# Patient Record
Sex: Female | Born: 1945
Health system: Southern US, Community
[De-identification: ages and names within clinical notes are randomized; demographics above are authoritative.]

## PROBLEM LIST (undated history)

## (undated) ENCOUNTER — Emergency Department (HOSPITAL_COMMUNITY): Admission: EM | Payer: Medicare PPO | Source: Home / Self Care

## (undated) DIAGNOSIS — D649 Anemia, unspecified: Secondary | ICD-10-CM

## (undated) DIAGNOSIS — H9190 Unspecified hearing loss, unspecified ear: Secondary | ICD-10-CM

## (undated) DIAGNOSIS — C4490 Unspecified malignant neoplasm of skin, unspecified: Secondary | ICD-10-CM

## (undated) DIAGNOSIS — M549 Dorsalgia, unspecified: Secondary | ICD-10-CM

## (undated) DIAGNOSIS — E785 Hyperlipidemia, unspecified: Secondary | ICD-10-CM

## (undated) DIAGNOSIS — I83819 Varicose veins of unspecified lower extremities with pain: Secondary | ICD-10-CM

## (undated) DIAGNOSIS — J189 Pneumonia, unspecified organism: Secondary | ICD-10-CM

## (undated) DIAGNOSIS — Z9889 Other specified postprocedural states: Secondary | ICD-10-CM

## (undated) DIAGNOSIS — C801 Malignant (primary) neoplasm, unspecified: Secondary | ICD-10-CM

## (undated) DIAGNOSIS — K219 Gastro-esophageal reflux disease without esophagitis: Secondary | ICD-10-CM

## (undated) DIAGNOSIS — M79606 Pain in leg, unspecified: Secondary | ICD-10-CM

## (undated) DIAGNOSIS — M8580 Other specified disorders of bone density and structure, unspecified site: Secondary | ICD-10-CM

## (undated) DIAGNOSIS — R112 Nausea with vomiting, unspecified: Secondary | ICD-10-CM

## (undated) DIAGNOSIS — R519 Headache, unspecified: Secondary | ICD-10-CM

## (undated) DIAGNOSIS — R51 Headache: Secondary | ICD-10-CM

## (undated) DIAGNOSIS — R011 Cardiac murmur, unspecified: Secondary | ICD-10-CM

## (undated) DIAGNOSIS — I10 Essential (primary) hypertension: Secondary | ICD-10-CM

## (undated) DIAGNOSIS — M2550 Pain in unspecified joint: Secondary | ICD-10-CM

## (undated) DIAGNOSIS — M1990 Unspecified osteoarthritis, unspecified site: Secondary | ICD-10-CM

## (undated) HISTORY — DX: Unspecified osteoarthritis, unspecified site: M19.90

## (undated) HISTORY — DX: Varicose veins of unspecified lower extremity with pain: I83.819

## (undated) HISTORY — DX: Dorsalgia, unspecified: M54.9

## (undated) HISTORY — DX: Unspecified hearing loss, unspecified ear: H91.90

## (undated) HISTORY — DX: Other specified disorders of bone density and structure, unspecified site: M85.80

## (undated) HISTORY — PX: CATARACT EXTRACTION: SUR2

## (undated) HISTORY — DX: Headache: R51

## (undated) HISTORY — DX: Hyperlipidemia, unspecified: E78.5

## (undated) HISTORY — DX: Headache, unspecified: R51.9

## (undated) HISTORY — DX: Essential (primary) hypertension: I10

## (undated) HISTORY — PX: ECTOPIC PREGNANCY SURGERY: SHX613

## (undated) HISTORY — PX: FINGER SURGERY: SHX640

## (undated) HISTORY — DX: Pain in leg, unspecified: M79.606

## (undated) HISTORY — PX: EXPLORATORY TYMPANOTOMY: SHX1545

## (undated) HISTORY — PX: STAPEDECTOMY: SHX2435

## (undated) HISTORY — DX: Cardiac murmur, unspecified: R01.1

## (undated) HISTORY — DX: Pain in unspecified joint: M25.50

## (undated) HISTORY — PX: ENDOVENOUS ABLATION SAPHENOUS VEIN W/ LASER: SUR449

## (undated) HISTORY — PX: OTHER SURGICAL HISTORY: SHX169

---
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
## 1980-04-22 HISTORY — PX: ABDOMINAL HYSTERECTOMY: SHX81

## 1997-12-08 ENCOUNTER — Other Ambulatory Visit: Admission: RE | Admit: 1997-12-08 | Discharge: 1997-12-08 | Payer: Self-pay | Admitting: Obstetrics and Gynecology

## 1998-02-16 ENCOUNTER — Ambulatory Visit (HOSPITAL_COMMUNITY): Admission: RE | Admit: 1998-02-16 | Discharge: 1998-02-16 | Payer: Self-pay | Admitting: Gynecology

## 1999-02-14 ENCOUNTER — Ambulatory Visit (HOSPITAL_COMMUNITY): Admission: RE | Admit: 1999-02-14 | Discharge: 1999-02-14 | Payer: Self-pay | Admitting: Gynecology

## 1999-02-14 ENCOUNTER — Encounter: Payer: Self-pay | Admitting: Gynecology

## 1999-04-23 HISTORY — PX: STAPEDECTOMY: SHX2435

## 1999-07-31 ENCOUNTER — Ambulatory Visit (HOSPITAL_BASED_OUTPATIENT_CLINIC_OR_DEPARTMENT_OTHER): Admission: RE | Admit: 1999-07-31 | Discharge: 1999-08-01 | Payer: Self-pay | Admitting: Otolaryngology

## 1999-08-16 ENCOUNTER — Other Ambulatory Visit: Admission: RE | Admit: 1999-08-16 | Discharge: 1999-08-16 | Payer: Self-pay | Admitting: Gynecology

## 2000-01-03 ENCOUNTER — Ambulatory Visit (HOSPITAL_BASED_OUTPATIENT_CLINIC_OR_DEPARTMENT_OTHER): Admission: RE | Admit: 2000-01-03 | Discharge: 2000-01-03 | Payer: Self-pay | Admitting: Otolaryngology

## 2000-02-20 ENCOUNTER — Encounter: Payer: Self-pay | Admitting: Gynecology

## 2000-02-20 ENCOUNTER — Ambulatory Visit (HOSPITAL_COMMUNITY): Admission: RE | Admit: 2000-02-20 | Discharge: 2000-02-20 | Payer: Self-pay | Admitting: Gynecology

## 2000-08-20 ENCOUNTER — Other Ambulatory Visit: Admission: RE | Admit: 2000-08-20 | Discharge: 2000-08-20 | Payer: Self-pay | Admitting: Obstetrics and Gynecology

## 2000-12-18 ENCOUNTER — Encounter (INDEPENDENT_AMBULATORY_CARE_PROVIDER_SITE_OTHER): Payer: Self-pay

## 2000-12-18 ENCOUNTER — Other Ambulatory Visit: Admission: RE | Admit: 2000-12-18 | Discharge: 2000-12-18 | Payer: Self-pay | Admitting: Otolaryngology

## 2001-10-01 ENCOUNTER — Other Ambulatory Visit: Admission: RE | Admit: 2001-10-01 | Discharge: 2001-10-01 | Payer: Self-pay | Admitting: Gynecology

## 2002-12-22 ENCOUNTER — Other Ambulatory Visit: Admission: RE | Admit: 2002-12-22 | Discharge: 2002-12-22 | Payer: Self-pay | Admitting: Gynecology

## 2003-02-23 ENCOUNTER — Ambulatory Visit (HOSPITAL_COMMUNITY): Admission: RE | Admit: 2003-02-23 | Discharge: 2003-02-23 | Payer: Self-pay | Admitting: Gynecology

## 2003-03-24 ENCOUNTER — Ambulatory Visit (HOSPITAL_COMMUNITY): Admission: RE | Admit: 2003-03-24 | Discharge: 2003-03-24 | Payer: Self-pay | Admitting: Gastroenterology

## 2003-03-24 ENCOUNTER — Encounter (INDEPENDENT_AMBULATORY_CARE_PROVIDER_SITE_OTHER): Payer: Self-pay | Admitting: Specialist

## 2003-07-28 ENCOUNTER — Encounter: Admission: RE | Admit: 2003-07-28 | Discharge: 2003-07-28 | Payer: Self-pay | Admitting: Gynecology

## 2003-11-03 ENCOUNTER — Encounter: Admission: RE | Admit: 2003-11-03 | Discharge: 2003-11-03 | Payer: Self-pay | Admitting: Diagnostic Radiology

## 2003-11-10 ENCOUNTER — Encounter: Admission: RE | Admit: 2003-11-10 | Discharge: 2003-11-10 | Payer: Self-pay | Admitting: Diagnostic Radiology

## 2003-12-01 ENCOUNTER — Encounter: Admission: RE | Admit: 2003-12-01 | Discharge: 2003-12-01 | Payer: Self-pay | Admitting: Diagnostic Radiology

## 2004-01-12 ENCOUNTER — Encounter: Admission: RE | Admit: 2004-01-12 | Discharge: 2004-01-12 | Payer: Self-pay | Admitting: Diagnostic Radiology

## 2004-02-15 ENCOUNTER — Other Ambulatory Visit: Admission: RE | Admit: 2004-02-15 | Discharge: 2004-02-15 | Payer: Self-pay | Admitting: Gynecology

## 2004-05-24 ENCOUNTER — Encounter: Admission: RE | Admit: 2004-05-24 | Discharge: 2004-05-24 | Payer: Self-pay | Admitting: Dermatology

## 2004-06-13 ENCOUNTER — Ambulatory Visit (HOSPITAL_COMMUNITY): Admission: RE | Admit: 2004-06-13 | Discharge: 2004-06-13 | Payer: Self-pay | Admitting: Gynecology

## 2005-02-25 ENCOUNTER — Other Ambulatory Visit: Admission: RE | Admit: 2005-02-25 | Discharge: 2005-02-25 | Payer: Self-pay | Admitting: Gynecology

## 2005-06-24 ENCOUNTER — Ambulatory Visit (HOSPITAL_COMMUNITY): Admission: RE | Admit: 2005-06-24 | Discharge: 2005-06-24 | Payer: Self-pay | Admitting: Gynecology

## 2005-07-11 ENCOUNTER — Encounter: Admission: RE | Admit: 2005-07-11 | Discharge: 2005-07-11 | Payer: Self-pay | Admitting: Gynecology

## 2005-08-12 ENCOUNTER — Encounter: Admission: RE | Admit: 2005-08-12 | Discharge: 2005-08-12 | Payer: Self-pay | Admitting: Internal Medicine

## 2005-11-15 ENCOUNTER — Ambulatory Visit (HOSPITAL_BASED_OUTPATIENT_CLINIC_OR_DEPARTMENT_OTHER): Admission: RE | Admit: 2005-11-15 | Discharge: 2005-11-15 | Payer: Self-pay | Admitting: Orthopedic Surgery

## 2006-04-10 ENCOUNTER — Ambulatory Visit (HOSPITAL_BASED_OUTPATIENT_CLINIC_OR_DEPARTMENT_OTHER): Admission: RE | Admit: 2006-04-10 | Discharge: 2006-04-10 | Payer: Self-pay | Admitting: Orthopedic Surgery

## 2006-05-15 ENCOUNTER — Other Ambulatory Visit: Admission: RE | Admit: 2006-05-15 | Discharge: 2006-05-15 | Payer: Self-pay | Admitting: Gynecology

## 2007-02-10 ENCOUNTER — Ambulatory Visit (HOSPITAL_COMMUNITY): Admission: RE | Admit: 2007-02-10 | Discharge: 2007-02-10 | Payer: Self-pay | Admitting: Gynecology

## 2007-07-22 ENCOUNTER — Other Ambulatory Visit: Admission: RE | Admit: 2007-07-22 | Discharge: 2007-07-22 | Payer: Self-pay | Admitting: Gynecology

## 2008-11-18 ENCOUNTER — Other Ambulatory Visit: Admission: RE | Admit: 2008-11-18 | Discharge: 2008-11-18 | Payer: Self-pay | Admitting: Gynecology

## 2008-11-18 ENCOUNTER — Encounter: Payer: Self-pay | Admitting: Women's Health

## 2008-11-18 ENCOUNTER — Ambulatory Visit: Payer: Self-pay | Admitting: Women's Health

## 2009-02-22 ENCOUNTER — Ambulatory Visit (HOSPITAL_COMMUNITY): Admission: RE | Admit: 2009-02-22 | Discharge: 2009-02-22 | Payer: Self-pay | Admitting: Gynecology

## 2009-03-03 ENCOUNTER — Encounter: Admission: RE | Admit: 2009-03-03 | Discharge: 2009-03-03 | Payer: Self-pay | Admitting: Gynecology

## 2010-03-07 ENCOUNTER — Encounter: Admission: RE | Admit: 2010-03-07 | Discharge: 2010-03-07 | Payer: Self-pay | Admitting: Gynecology

## 2010-03-07 ENCOUNTER — Ambulatory Visit: Payer: Self-pay | Admitting: Women's Health

## 2010-04-17 ENCOUNTER — Ambulatory Visit: Payer: Self-pay | Admitting: Women's Health

## 2010-05-13 ENCOUNTER — Encounter: Payer: Self-pay | Admitting: Dermatology

## 2010-09-05 ENCOUNTER — Encounter (INDEPENDENT_AMBULATORY_CARE_PROVIDER_SITE_OTHER): Payer: Medicare PPO

## 2010-09-05 ENCOUNTER — Encounter (INDEPENDENT_AMBULATORY_CARE_PROVIDER_SITE_OTHER): Payer: Medicare PPO | Admitting: Vascular Surgery

## 2010-09-05 DIAGNOSIS — I83893 Varicose veins of bilateral lower extremities with other complications: Secondary | ICD-10-CM

## 2010-09-05 DIAGNOSIS — M79609 Pain in unspecified limb: Secondary | ICD-10-CM

## 2010-09-05 NOTE — Consult Note (Signed)
NEW PATIENT CONSULTATION  Natalie, [name] DOB:  1946-03-13                                       09/05/2010 ZOXWR#:[id number]  Patient presents today for discussion regarding venous hypertension and discomfort in her legs.  She is a very pleasant, healthy 64-year white female with a long history of progressive venous pathology.  She had been seen in our office as early as 1994.  She has had progressive discomfort and has had prior treatment with sclerotherapy and apparently also had treatment with laser ablation in her left leg as early as a decade ago.  This was in the saphenous vein, according to the patient. Her main complaint is of aching discomfort in both lower extremities, somewhat worse on her left than on her right.  She does not have any marked swelling.  She does have an extensive aching and throbbing, more so on the right than on the left.  This is worse with prolonged standing.  She does have some discomfort related to the varicosities extending over her left anterior thigh and popliteal fossa.  She does not have any history of DVT or bleeding.  She does not have any history of venous ulcers.  She does have a history of restless leg syndrome. She has worn both pantyhose and thigh hose graduated compression garments in the past.  PAST MEDICAL HISTORY:  Otherwise positive for hypertension and elevated cholesterol.  She is married with two children.  She is retired.  She does not smoke or drink alcohol.  REVIEW OF SYSTEMS:  No weight loss or gain.  She weighs 138 pounds.  She is 5 feet 2 inches tall. VASCULAR:  Positive for pain with prolonged standing and walking. CARDIAC:  Heart murmur. GI:  Reflux. NEUROLOGIC:  Headache. ENT:  Change in hearing. MUSCULOSKELETAL:  Positive for joint pain and arthritis, otherwise review of systems are negative.  PHYSICAL EXAMINATION:  A well-developed and well-nourished white female appearing stated age in no  acute distress.  Blood pressure 154/51, pulse 71, respirations 18.  HEENT:  Normal.  Her dorsalis pedis pulses are 2+ bilaterally.  She has 2+ radial pulses bilaterally.  Musculoskeletal shows no major deformity or cyanosis.  Neurologic:  No focal weakness or paresthesias.  Skin without ulcers or rashes.  She did undergo formal venous duplex in our office today.  This did show normal caliber in her saphenous vein on the right with some very mild reflux in the mid thigh.  On the left she also has a normal caliber saphenous vein.  There are several tributary branches, and on imaging myself it does appear that she may have had a partial ablation of some of her great saphenous vein, although this is not enlarged.  She does have a branch that comes off near the saphenofemoral junction that is refluxing and causing all of the varicosities extending down her thigh and popliteal fossa.  I discussed the significance of this with this patient, explaining this is due to venous hypertension in this refluxing tributary branch.  I have recommended that we would get her to restart the use of her thigh- high graduated compression stockings 20-30 mmHg.  She will be diligent with this, and we will see her again in 3 months for further discussion. I do not see any necessity for ablation of her saphenous veins, as this does not appear  to be the culprit.  This appears to be an enlarged tributary coming off the saphenous vein near the saphenofemoral junction.  I felt this would be most appropriately treated with stab phlebectomy should she follow conservative treatment.  We will discuss this further with her in 3 months.    Larina Earthly, M.D. Electronically Signed  TFE/MEDQ  D:  09/05/2010  T:  09/05/2010  Job:  [id number]  cc:   Venancio Poisson, MD

## 2010-09-07 NOTE — Op Note (Signed)
NAME:  Natalie [name], Natalie [name]               ACCOUNT NO.:  [id number]   MEDICAL RECORD NO.:  [id number]          PATIENT TYPE:  AMB   LOCATION:  DSC                          FACILITY:  [facility]   PHYSICIAN:  Katy Fitch. Sypher, M.D. DATE OF BIRTH:  [birth year]   DATE OF PROCEDURE:  11/15/2005  DATE OF DISCHARGE:                                 OPERATIVE REPORT   PREOPERATIVE DIAGNOSIS:  Displaced periarticular fractures of right ring and  small finger proximal phalangeal bases.   POSTOPERATIVE DIAGNOSIS:  Displaced periarticular fractures of right ring  and small finger proximal phalangeal bases.   OPERATION:  Closed reduction, percutaneous Kirschner wire fixation of right  ring and small finger P1 displaced fractures.   OPERATIONS:  Josephine Igo, M.D.   ASSISTANT:  Annye Rusk, P.A.-C.   ANESTHESIA:  General by LMA, supplemented by an axillary block.   SUPERVISING ANESTHESIOLOGIST:  Burna Forts, M.D.   INDICATIONS:  [name] is a 65 year old woman who fell while visiting  [place], [place], on November 11, 2005 sustaining closed displaced proximal  metaphyseal fractures of the right ring and small finger proximal phalangeal  metaphyseal basis.   She had significant displacement.  She was seen in [place] were x-rays were  obtained documenting her injury.  She was placed in a compression dressing  and returned home for definitive care.  She sought a hand surgery consult on  November 14, 2005 and at that time was advised to proceed with closed reduction  and percutaneous pinning to obtain and maintain an anatomic reduction of her  fracture predicament.   Previously, more than 10 years ago, she had sustained a distal radius  fracture that was treated with closed reduction and external fixation.  She  developed a postoperative dystrophy and had chronic finger stiffness.   We described to her the strategy of obtaining accurate reduction followed by  aggressive therapy in an effort to prevent  post-traumatic dystrophy  following this injury.  Preoperatively questions were invited and answered  in detail.   PROCEDURE:  Britzy Graul was brought to the operating room and placed in  supine position on the operating table.   Following an anesthesia consult by Dr. Jacklynn Bue, a combined anesthesia  strategy of an axillary block and general anesthesia by LMA was recommended.   Under Dr. Marlane Mingle supervision, Ms. Betty was placed under general  anesthesia by LMA technique followed by routine Betadine scrub and paint of  her right upper extremity.   With the aid of a C-arm fluoroscope, her fractures were manipulated to an  anatomic position through a combined maneuver of traction, palmar flexion of  the distal fragments, and use of a fluoroscope to assure proper rotation and  angular correction of her deformity.   When the fracture appeared anatomic, the ring and small finger proximal  phalanges were secured with intermaxillary 0.035-inch Kirschner wires driven  across the ring and small finger metacarpal heads into the intermaxillary  canal.   AP and lateral C-arm images were obtained documenting satisfactory position  of the Kirschner wires as well as anatomic reduction of the fractures.  The wounds were then dressed with Xeroflo, sterile gauze and a voluminous  hand dressing with dorsal and palmar plaster splints maintaining the hand in  the safe position.  There were no apparent complications.      Katy Fitch Sypher, M.D.  Electronically Signed     RVS/MEDQ  D:  11/15/2005  T:  11/15/2005  Job:  [id number]   cc:   Ladell Pier, M.D.

## 2010-09-07 NOTE — Op Note (Signed)
[place]. [facility]  Patient:    Natalie [name], Natalie [name]                      MRN: [id number] Proc. Date: 07/31/99 Adm. Date:  [id number] Attending:  Serena Colonel H                           Operative Report  PREOPERATIVE DIAGNOSIS:  Conductive hearing loss, suspected otosclerosis.  POSTOPERATIVE DIAGNOSIS:  Otosclerosis.  OPERATION:  Right stapedectomy.  SURGEON:  Jefry H. Pollyann Kennedy, M.D.  ANESTHESIA:  General endotracheal  COMPLICATIONS:  None.  FINDINGS: 1. Stiffening and partial fixation of the stapes foot plate. 2. Normal mobility of the malleus and incus. 3. Thickening and fibrotic middle ear mucosa of hypotympanum. 4. 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  PROSTHESIS USED:  A Lippe modified Robinson bucket handle prosthesis 2.5 mm length.  DISPOSITION:  The patient tolerated the procedure well, was awakened, extubated and transferred to recovery in stable condition.  INDICATION FOR PROCEDURE:  A 65 year old with progressive hearing loss in the right ear and some mild hearing loss in the left ear as well.  The risks, benefits, alternatives and complications of the procedure were explained to the patient and her husband who seem to understand and agreed to surgery.  DESCRIPTION OF PROCEDURE:  The patient was taken to the operating room and placed on the operating table in the supine position. Following induction of general endotracheal anesthesia, the ear was prepped and draped in a standard fashion.  Four quadrant ear canal injections were performed using 1% xylocaine with 1:100,000 epinephrine.  Injection was also performed in the tragus.  A posterior tympanomeatal flap was developed using a Rosen round knife and the flap was brought forward.  It was dissected off of the chordae tympani nerve which was kept intact. Some bone of the posterior annulus and pyramidal eminence was removed using stapes curet.  The ossicles were  identified and palpated.  The above mentioned findings were noted.  Tragal perichondrium was harvested, cut to size and shape and flattened.  The tragal incision was reapproximated with inverted interrupted 5-0 chromic suture.  The incudostapedial joint was divided.  Inspection revealed absence of the stapedial tendon.  A control hole was created in the stapes foot  plate.  The oval window was completely visualized and there was no overhanging facial nerve.  The stapes was removed using a Rosen needle and down fractured towards the promontory.  The entire stapes was removed in one piece.  No perilymph gusher was identified.  The perichondrium was placed into position.  The anterior tympanic cavity was packed with saline soaked Gelfoam.  The prosthesis was placed into position and the bucket handle was brought over the incus long process. Palpation of the ossicular chain revealed all ossicles to move nicely and there was a nice round window reflux identified.  The tympanomeatal flap was brought back to its native position and Cortisporin soaked Gelfoam was placed in the lateral aspect of the drum along the incision. A cottonball with Bacitracin was placed at the external meatus. The patient was then awakened, extubated and transferred to recovery. DD:  07/31/99 TD:  08/01/99 Job: [id number]

## 2010-09-07 NOTE — Op Note (Signed)
NAMELATIFA, NOBLE               ACCOUNT NO.:  [id number]   MEDICAL RECORD NO.:  [id number]          PATIENT TYPE:  AMB   LOCATION:  DSC                          FACILITY:  [facility]   PHYSICIAN:  Katy Fitch. Sypher, M.D. DATE OF BIRTH:  [birth year]   DATE OF PROCEDURE:  04/10/2006  DATE OF DISCHARGE:                               OPERATIVE REPORT   PREOPERATIVE DIAGNOSIS:  Flexor tenodesis right small and ring finger  status post closed reduction and percutaneous Kirschner wire fixation of  proximal phalangeal proximal metaphyseal fractures with surgery  performed in July 2007.   POSTOPERATIVE DIAGNOSIS:  1. Flexor tenolysis of right small finger and palm and finger flexor      sheath with adhesions noted deep to A2 pulley between the profundus      and superficialis tendons and the periosteum of the proximal      phalanx.  2. Tenodesis of right ring finger flexor tendons deep to the A2 pulley      and inter-tendinous adhesions deep to the A2 pulley.   OPERATION:  1. Flexor tenolysis of right small finger palm and finger.  2. Flexor tenolysis of right ring finger and palm and proximal finger.   SURGEON:  Katy Fitch. Sypher, M.D.   ASSISTANT:  Marveen Reeks. Dasnoit, P.A.-C.   ANESTHESIA:  General sedation and IV regional supplemented by 0.25%  Marcaine metacarpal head level block of right ring and small fingers.   SUPERVISING ANESTHESIOLOGIST:  Quita Skye. Krista Blue, M.D.   INDICATIONS:  Natalie [name] is a 65 year old woman who is status post  fracture of her right ring and small finger proximal phalanges sustained  in July 2007.  She was referred for definitive orthopedic care.  At the  time of her initial consult, she was noted to have apex volar angulation  of her fractures and shortening.  We advised her to undergo closed  reduction and percutaneous Kirschner wire fixation.  We achieved near  anatomic reduction of her fractures and ultimately the fracture went on  to heal without  significant complications with respect to alignment or  rotation.  However, due to a short period of immobilization, she  developed flexor tendon adhesions between the superficialis and  profundus tendons and the periosteum of the proximal phalanges of the  ring and small fingers.  She was unable to flex her fingertips to the  palm.  Therefore, after failure to respond to five months of physical  therapy, she is brought to the operating at this time for tenolysis of  the flexors.   PROCEDURE:  [name] is brought to the operating room and placed in  the supine position on the operating table.  Following an anesthesia  consult by Dr. Krista Blue, IV regional anesthesia at the proximal brachial  level was advised.  [name] had mild systolic hypertension, creating  some challenges for the anesthesia service and placement of an IV  regional block.  A double cuff pneumatic tourniquet was applied to the  proximal brachium.  After the IV regional was placed, we initially had  some bleeding beneath the tourniquet despite  a pressure of 300 mmHg.  The pressure was raised to 325 mmHg.  After 13 minutes, we had adequate  anesthesia to allow the procedure to commence.   Brunner zigzag incisions were planned on the palmar surface of the ring  and small finger flexor sheath and the small finger from the A3 pulley  to the A1 pulley and in the ring finger from the A2 to the A1 pulleys.  The flexor sheaths were exposed taking great care to retract the  neurovascular bundles.  A window was created between the A1 and A2  pulleys of the small finger and a fine Glorious Peach was used to lyse adhesions  on the dorsal surface of the profundus and superficialis with the  adjacent periosteum of the proximal phalanx.  With traction, we could  not achieve motion, therefore, a second sheath opening was created  between the A2 pulley and C1 pulley distally.  The tendons were placed  under traction and the adhesions  removed with scissor dissection and use  of a fine rongeur.  A traction tenolysis was completed utilizing an  Allis clamp ultimately recovering full range of motion.  The tendons  were delivered and all scar debrided with a fine rongeur.  Thereafter,  full active motion of the small finger was achieved at the MP and IP  joints.   Attention was then directed to the ring finger.  An oblique incision was  fashioned exposing the flexor sheath.  Once again, a window was created  between the distal margin of the A1 pulley and the proximal A2 pulley.  The tendons were elevated, scissor dissection was utilized as well as  use of a fine Freer to lyse adhesions between the flexor tendons and the  periosteum of the proximal phalanx. With traction tenolysis, both  tendons were fully mobilized.  The tendons were delivered and stripped  of all scar.  Thereafter, full active range of motion of the ring finger  was recovered.   0.25% Marcaine was infiltrated along the wound margins in the region of  the common digital nerves to the long, ring, and small fingers.  The  wounds were then repaired with a mattress suture of 5-0 nylon.  The  tourniquet was released and after five minutes, [name] demonstrated  full active flexion and extension of her fingers.  She was placed in a  compressive dressing for the first 24 hours to control edema and prevent  bleeding.  She will return to our office in 24 hours for placement in a  smaller dressing to facilitate immediate elective range of motion  excises.  There were no apparent complications.   For aftercare, [name] was provided a prescription for Percocet 5 mg  1-2 tablets p.o. q.4-6h. p.r.n. pain, 30 tablets without refill.  I will  see her back for follow-up in 24 hours unless she has problems.      Katy Fitch Sypher, M.D.  Electronically Signed     RVS/MEDQ  D:  04/10/2006  T:  04/11/2006  Job:  [id number]

## 2010-09-07 NOTE — Op Note (Signed)
NAME:  Natalie [name], Natalie [name]                         ACCOUNT NO.:  [id number]   MEDICAL RECORD NO.:  [id number]                   PATIENT TYPE:  AMB   LOCATION:  ENDO                                 FACILITY:   PHYSICIAN:  Anselmo Rod, M.D.               DATE OF BIRTH:  03-Nov-1945   DATE OF PROCEDURE:  03/24/2003  DATE OF DISCHARGE:                                 OPERATIVE REPORT   PROCEDURE PERFORMED:  Colonoscopy with core biopsies x2.   ENDOSCOPIST:  Anselmo Rod, M.D.   INSTRUMENT USED:  Olympus video colonoscope.   INDICATIONS FOR PROCEDURE:  A 65 year old white female with a family history  of colon cancer and two paternal cousins undergoing screening colonoscopy to  rule out colonic polyps, masses, etc.   PREPROCEDURE PREPARATION:  Informed consent was procured from the patient.  The patient was fasted for eight hours prior to procedure and prepped with a  bottle of magnesium citrate and gallon of GoLYTELY the night prior to  procedure.   PREPROCEDURE PHYSICAL:  VITAL SIGNS:  The patient has stable vital signs.  NECK:  Supple.  CHEST:  Clear to auscultation.  CARDIAC:  S1, S2 regular.  ABDOMEN:  Soft with normal bowel sounds.   DESCRIPTION OF PROCEDURE:  The patient was placed in the left lateral  decubitus position and sedated with 70 mg of Demerol and 7 mg of Versed  intravenously. Once the patient was adequately sedated and maintained on low-  flow oxygen and continuous cardiac monitoring, the Olympus video colonoscope  was advanced from the rectum to the cecum and terminal ileum which was  briefly visualized and appeared normal.  The appendicular orifice and  ileocecal valve were clearly visualized and photographed.  A small sessile  polyp was biopsied from 80 cm.  Small internal hemorrhoids were seen on  retroflexion.  There were a few early scattered diverticula seen; one in the  right colon and a couple in the left colon.  The patient tolerated the  procedure  well without complications.  No large masses or polyps were seen.   IMPRESSION:  1. Small nonbleeding internal hemorrhoids.  2. Small sessile polyp biopsied from 80 cm.  3. Few scattered diverticula seen in the colon.   RECOMMENDATIONS:  1. Await pathology results.  2. Avoid nonsteroidals including aspirin for the next two weeks.  3. Outpatient followup in the next four weeks for further recommendation.                                               Anselmo Rod, M.D.    JNM/MEDQ  D:  03/24/2003  T:  03/25/2003  Job:  [id number]   cc:   Davonna Belling. Young, N.P.   Timothy P. Fontaine, M.D.  [address]  [address]  [place]  [place] [address]  Fax: [phone]

## 2010-09-07 NOTE — Op Note (Signed)
[place]. [facility]  Patient:    Natalie [name], Natalie [name]                      MRN: [id number] Proc. Date: 01/03/00 Adm. Date:  [id number] Disc. Date: [id number] Attending:  Serena Colonel H                           Operative Report  PREOPERATIVE DIAGNOSIS:  Conductive hearing loss status post stapedectomy.  POSTOPERATIVE DIAGNOSIS:  Conductive hearing loss status post stapedectomy.  OPERATION:  Exploratory tympanotomy with replacement of stapes prosthesis.  SURGEON:  Jefry H. Pollyann Kennedy, M.D.  ANESTHESIA:  General endotracheal anesthesia.  FINDINGS:  Inferior displacement of the bucket handle of the prosthesis with the entire bucket filled with fibrosis and some fibrotic changes surrounding the piston of the prosthesis as well.  The chorda tympany nerve was stretched over the scar tissue around the bucket handle.  The patient tolerated the procedure well and was transferred to recovery in stable condition.  ANESTHESIA:  Local with intravenous sedation.  COMPLICATIONS:  None.  INDICATIONS:  The patient is a 65 year old lady who underwent stapedectomy about four months prior.  She had partial closure of the airbone gap but had persistent problems with dysgeusia and conductive hearing loss.   Risks, benefits, alternatives and complications of the procedure were explained to the patient who seemed to understand and agreed to surgery.  DESCRIPTION OF PROCEDURE:  The patient was taken to the operating room and placed on the operating room table in the supine position.  Following initiation of intravenous sedation, the ear was prepped and draped in the standard fashion.  Then 1% Xylocaine with epinephrine was infiltrated into the four quadrants of the ear canal.  A posteriorly based tympanomeatal flap was then developed with a round knife and brought forward.  The corda tympany nerve was intact but was stretched over the scar tissue.  Decision was made to divide it in  order to prevent any further dysgeusia.  The flap was brought forward exposing the incus long process and the prosthesis.  The prosthesis was in position as described above. _____ and sickle blade were used to carefully remove all of the surrounding fibrosis and to manipulate the prosthesis more superiorly with the handle draped over the incus long process. During these maneuvers, the patient was asked to judge the amount of hearing, and this improved it.  A small piece of Gelfilm was placed superior to the incus and small pieces of Gelfilm were packed into the tubotympanum.  Small pieces of saline-soaked Gelfoam were also used inferiorly to support the prosthesis and try to prevent it from dropping inferiorly again.  The flap was returned to its normal position and secured in place with Cortisporin soaked Gelfoam.  There was no dizziness.  She seemed to have immediate improvement in her hearing. Cotton ball was placed at the external meatus with Bacitracin ointment.  The patient was sent to recovery in stable condition. DD:  01/07/00 TD:  01/08/00 Job: [id number]

## 2010-09-12 NOTE — Procedures (Unsigned)
LOWER EXTREMITY VENOUS REFLUX EXAM  INDICATION:  Bilateral varicose veins.  EXAM:  Using color-flow imaging and pulse Doppler spectral analysis, the right and left common femoral, superficial femoral, popliteal, posterior tibial, greater and lesser saphenous veins are evaluated.  There is evidence suggesting deep venous insufficiency in the left lower extremity.  There is no evidence suggesting deep venous insufficiency in the right lower extremity.  The left saphenofemoral junction is not competent with Reflux of >553milliseconds. The right saphenofemoral junction is competent.  The right and left GSV's are not competent with Reflux of >527milliseconds with the caliber as described below.  The right and left proximal short saphenous vein demonstrates competency.  GSV Diameter (used if found to be incompetent only)                                           Right    Left Proximal Greater Saphenous Vein           0.37 cm  0.22 cm Proximal-to-mid-thigh                     0.29 cm  0.24 cm Mid thigh                                 0.31 cm  0.22 cm Mid-distal thigh                          cm       cm Distal thigh                              0.22 cm  0.23 cm Knee                                      0.20 cm  0.16 cm  IMPRESSION: 1. Right and left greater saphenous veins are not competent with     reflux >500 milliseconds. 2. The left greater saphenous vein is tortuous. 3. The left deep venous system is not competent with reflux of >500     milliseconds. 4. The right and left small saphenous veins are competent.            ___________________________________________ Larina Earthly, M.D.  EM/MEDQ  D:  09/05/2010  T:  09/05/2010  Job:  [id number]

## 2010-11-29 ENCOUNTER — Encounter: Payer: Self-pay | Admitting: Vascular Surgery

## 2010-12-11 ENCOUNTER — Ambulatory Visit (INDEPENDENT_AMBULATORY_CARE_PROVIDER_SITE_OTHER): Payer: Medicare PPO | Admitting: Vascular Surgery

## 2010-12-11 ENCOUNTER — Encounter: Payer: Self-pay | Admitting: Vascular Surgery

## 2010-12-11 VITALS — BP 144/73 | HR 64 | Resp 16 | Ht 62.0 in | Wt 140.0 lb

## 2010-12-11 DIAGNOSIS — I83893 Varicose veins of bilateral lower extremities with other complications: Secondary | ICD-10-CM

## 2010-12-11 NOTE — Progress Notes (Signed)
Subjective:     Patient ID: Natalie [name], female   DOB: [birth year], 65 y.o.   MRN: [id number]  HPI The patient presents today for followup of her venous pathology. She is 1 compression garments for 3 months with no improvement. She reports that she walks on a treadmill 3 times weekly for exercise. She has had to stop exercising due to leg pain. Reports that yardwork and gardening is difficulty to pain. The pain is specifically over the veins that ran over her anterior thigh lateral knee and down onto her calf on her left leg.  Review of Systems No change    Objective:   Physical Exam Well-developed well-nourished white female in no acute distress dorsalis pedis pulses 2+ bilaterally. He has tributary varicosities extending from her medial groin to her lateral knee and down onto her calf. SonoSite imaging of this reveals that they are not associated with saphenous vein reflux.    Assessment:     Painful tributary varicosities left leg. She has had prior laser ablation of her great saphenous vein approximately 10 years ago.    Plan:     Stab phlebectomy of painful tributary varicosities left leg

## 2010-12-12 ENCOUNTER — Ambulatory Visit: Payer: Medicare PPO | Admitting: Vascular Surgery

## 2010-12-22 HISTORY — PX: VEIN LIGATION AND STRIPPING: SHX2653

## 2010-12-31 ENCOUNTER — Encounter: Payer: Self-pay | Admitting: Vascular Surgery

## 2011-01-01 ENCOUNTER — Encounter: Payer: Self-pay | Admitting: Vascular Surgery

## 2011-01-02 ENCOUNTER — Ambulatory Visit (INDEPENDENT_AMBULATORY_CARE_PROVIDER_SITE_OTHER): Payer: Medicare PPO | Admitting: Vascular Surgery

## 2011-01-02 ENCOUNTER — Encounter: Payer: Self-pay | Admitting: Vascular Surgery

## 2011-01-02 VITALS — BP 140/57 | HR 64 | Resp 16 | Ht 62.0 in | Wt 140.0 lb

## 2011-01-02 DIAGNOSIS — I83893 Varicose veins of bilateral lower extremities with other complications: Secondary | ICD-10-CM

## 2011-01-02 NOTE — Progress Notes (Signed)
Laser Ablation Procedure      Date: 01/02/2011    [name] DOB:[birth year]  Consent signed: Yes  Surgeon:T.F. Langford Carias    BP 140/57  Pulse 64  Resp 16  Ht 5\' 2"  (1.575 m)  Wt 140 lb (63.504 kg)  BMI 25.61 kg/m2  Start time: 925AM   End time: 1050AM  Tumescent Anesthesia: 375 cc 0.9% NaCl with 50 cc Lidocaine HCL with 1% Epi and 15 cc 8.4% NaHCO3  Local Anesthesia: 1.5 cc Lidocaine HCL and NaHCO3 (ratio 2:1)       Stab Phlebectomy: 10-20 Sites: Thigh, Calf (LEFT LEG)  Patient tolerated procedure well: Yes [name], Natalie [name]   Description of Procedure:  After marking the course of the saphenous vein and the secondary varicosities in the standing position, the patient was placed on the operating table in the supine position, and the left leg was prepped and draped in sterile fashion. Local anesthetic was administered.  The patient was then put into Trendelenburg position.  Local anesthetic was utilized overlying the marked varicosities.  Greater than 10-20 stab wounds were made using the tip of an 11 blade; and using the vein hook,  The phlebectomies were performed using a hemostat to avulse these varicosities.  Adequate hemostasis was achieved, and steri strips were applied to the stab wound.      ABD pads and thigh high compression stockings were applied.  Ace wrap bandages were applied over the phlebectomy sites .  Blood loss was less than 15 cc.  The patient ambulated out of the operating room having tolerated the procedure well.

## 2011-01-03 ENCOUNTER — Telehealth: Payer: Self-pay | Admitting: *Deleted

## 2011-01-03 NOTE — Telephone Encounter (Signed)
Natalie [name] has no complaints of bleeding or swelling.  She states she is having left leg discomfort.  Encouraged Natalie [name] to elevate left leg,use ice compresses to left leg, and to take Ibuprofen 400-600 mg. with food every 4-6 hours prn discomfort.  Reminded Natalie [name] of follow up appointment with Dr. Arbie Cookey on 01-16-2011.    [name], Neena Rhymes

## 2011-01-15 ENCOUNTER — Encounter: Payer: Self-pay | Admitting: Vascular Surgery

## 2011-01-16 ENCOUNTER — Ambulatory Visit (INDEPENDENT_AMBULATORY_CARE_PROVIDER_SITE_OTHER): Payer: Medicare PPO | Admitting: Vascular Surgery

## 2011-01-16 ENCOUNTER — Encounter: Payer: Self-pay | Admitting: Vascular Surgery

## 2011-01-16 VITALS — BP 147/63 | HR 81 | Resp 18 | Ht 62.0 in | Wt 140.0 lb

## 2011-01-16 DIAGNOSIS — I83893 Varicose veins of bilateral lower extremities with other complications: Secondary | ICD-10-CM

## 2011-01-16 NOTE — Progress Notes (Signed)
The patient presents today for followup of her stab phlebectomy of left leg painful varicosities. These extended over her anterior thigh lateral thigh and knee and over her calf. She also had several in her popliteal fossa. All stab sites looked quite good. She does have the typical amount of thickening under the phlebectomy sites. She is pleased with the result. She is concern regarding telangiectasia and clear in her ankles and will notify should she wish to proceed with sclerotherapy for treatment of these.

## 2011-01-23 ENCOUNTER — Ambulatory Visit: Payer: Medicare PPO | Admitting: *Deleted

## 2011-01-30 ENCOUNTER — Ambulatory Visit (INDEPENDENT_AMBULATORY_CARE_PROVIDER_SITE_OTHER): Payer: Medicare PPO | Admitting: *Deleted

## 2011-01-30 DIAGNOSIS — I781 Nevus, non-neoplastic: Secondary | ICD-10-CM

## 2011-01-30 DIAGNOSIS — I83893 Varicose veins of bilateral lower extremities with other complications: Secondary | ICD-10-CM

## 2011-01-30 NOTE — Progress Notes (Signed)
Subjective:     Patient ID: Natalie [name], female   DOB: 11-27-1945, 65 y.o.   MRN: [id number]  HPI  X=.3% Sotradecol administered with a 27g butterfly.  Patient received a total of 12cc foam. Photos: yes, archived  Compression stockings applied: yes and aces   Review of Systems     Objective:   Physical Exam     Assessment:     Treated as many spiders as possible with 2 syringes. She will need more treatments. They are scattered all over her legs. Anticipate good results for this nice lady.    Plan:     She has made a future appt for Jan. Will foloow prn.

## 2011-03-29 ENCOUNTER — Encounter: Payer: Self-pay | Admitting: Vascular Surgery

## 2011-04-01 ENCOUNTER — Encounter: Payer: Self-pay | Admitting: Vascular Surgery

## 2011-05-08 ENCOUNTER — Ambulatory Visit: Payer: Medicare PPO | Admitting: *Deleted

## 2011-05-28 ENCOUNTER — Encounter: Payer: Self-pay | Admitting: Gynecology

## 2011-05-28 DIAGNOSIS — M1990 Unspecified osteoarthritis, unspecified site: Secondary | ICD-10-CM | POA: Insufficient documentation

## 2011-05-28 DIAGNOSIS — I10 Essential (primary) hypertension: Secondary | ICD-10-CM | POA: Insufficient documentation

## 2011-05-28 DIAGNOSIS — M8580 Other specified disorders of bone density and structure, unspecified site: Secondary | ICD-10-CM | POA: Insufficient documentation

## 2011-05-28 DIAGNOSIS — E785 Hyperlipidemia, unspecified: Secondary | ICD-10-CM | POA: Insufficient documentation

## 2011-05-28 DIAGNOSIS — R011 Cardiac murmur, unspecified: Secondary | ICD-10-CM | POA: Insufficient documentation

## 2011-06-05 ENCOUNTER — Encounter: Payer: Self-pay | Admitting: Women's Health

## 2011-06-05 ENCOUNTER — Ambulatory Visit (INDEPENDENT_AMBULATORY_CARE_PROVIDER_SITE_OTHER): Payer: Medicare PPO | Admitting: Women's Health

## 2011-06-05 VITALS — BP 122/78 | Ht 62.0 in | Wt 140.0 lb

## 2011-06-05 DIAGNOSIS — Z780 Asymptomatic menopausal state: Secondary | ICD-10-CM

## 2011-06-05 DIAGNOSIS — N951 Menopausal and female climacteric states: Secondary | ICD-10-CM

## 2011-06-05 MED ORDER — ESTRADIOL 0.5 MG PO TABS: 0.5000 mg | ORAL_TABLET | Freq: Every day | ORAL | Status: DC

## 2011-06-05 NOTE — Patient Instructions (Signed)
Vit d 2000 daily   

## 2011-06-05 NOTE — Progress Notes (Signed)
Natalie [name] May 30, 1945 [id number]    History:    The patient presents for discussion of ERT and osteopenia. Currently on half a tablet of estradiol 0.5. Osteopenia stable- last DEXA 12 /2011 which showed a T score of -1.4 at the hip. History of all normal Paps. TAH with BSO in 82. Normal mammograms.  Past medical history, past surgical history, family history and social history were all reviewed and documented in the EPIC chart. Hypertension, hypercholesterolemia meds and labs at primary care. Has had  Pneumovax vaccine, and will get Zostovac this year. Current on other vaccinations. Has an annual skin check, history of precancerous skin lesions in 2005.   ROS:  A  ROS was performed and pertinent positives and negatives are included in the history.  Exam:  Filed Vitals:   06/05/11 1403  BP: 122/78    General appearance:  Normal Head/Neck:  Normal, without cervical or supraclavicular adenopathy. Thyroid:  Symmetrical, normal in size, without palpable masses or nodularity. Respiratory  Effort:  Normal  Auscultation:  Clear without wheezing or rhonchi Cardiovascular  Auscultation:  Regular rate, without rubs, murmurs or gallops  Edema/varicosities:  Not grossly evident Abdominal  Soft,nontender, without masses, guarding or rebound.  Liver/spleen:  No organomegaly noted  Hernia:  None appreciated  Skin  Inspection:  Grossly normal  Palpation:  Grossly normal Neurologic/psychiatric  Orientation:  Normal with appropriate conversation.  Mood/affect:  Normal  Genitourinary    Breasts: Examined lying and sitting.     Right: Without masses, retractions, discharge or axillary adenopathy.     Left: Without masses, retractions, discharge or axillary adenopathy.   Inguinal/mons:  Normal without inguinal adenopathy  External genitalia:  Normal  BUS/Urethra/Skene's glands:  Normal  Bladder:  Normal  Vagina:  Normal  Cervix:  Absent   Uterus:  Absent  Adnexa/parametria:      Rt: Without masses or tenderness.   Lt: Without masses or tenderness.  Anus and perineum: Normal  Digital rectal exam: Normal sphincter tone without palpated masses or tenderness  Assessment/Plan:  66 y.o. M WF G4 P2  for ERT management and osteopenia. Only complaint is  constipation.  History of a benign colon polyp 07  TAH with BSO on estradiol Osteopenia -1.4 at the hip - stable 03/2010 Hypertension/hypercholesterolemia-primary care labs and meds  Plan: Stable DEXA- repeat in one year. Continue home safety and fall prevention, calcium rich diet and vitamin D 2000 daily. SBE's, annual mammogram, will schedule. Continue exercise and active lifestyle. History of benign colon polyp instructed to schedule followup colonoscopy. Increase fiber rich foods to greater than 20 g per day. Estradiol 0.5 daily uses only half tablet daily, prescription, proper use, slight risk for blood clots, strokes, breast cancer reviewed. States when off ERT has increased hot flushes. History of basal cell skin cancer and has an annual skin check and will continue.   Harrington Challenger [facility], 3:07 PM 06/05/2011

## 2011-06-12 ENCOUNTER — Encounter: Payer: Self-pay | Admitting: *Deleted

## 2011-06-12 ENCOUNTER — Ambulatory Visit (INDEPENDENT_AMBULATORY_CARE_PROVIDER_SITE_OTHER): Payer: Medicare PPO | Admitting: *Deleted

## 2011-06-12 DIAGNOSIS — I781 Nevus, non-neoplastic: Secondary | ICD-10-CM

## 2011-06-12 NOTE — Progress Notes (Signed)
X=.3% Sotradecol administered with a 27g butterfly.  Patient received a total of 12cc.  Treated as many areas As I could with 2 syringes. Unable to get them all. Will follow prn.  Photos: yes  Compression stockings applied: yes

## 2011-06-13 ENCOUNTER — Encounter: Payer: Self-pay | Admitting: Vascular Surgery

## 2011-08-06 ENCOUNTER — Other Ambulatory Visit: Payer: Self-pay | Admitting: Gynecology

## 2011-08-06 ENCOUNTER — Telehealth: Payer: Self-pay | Admitting: *Deleted

## 2011-08-06 NOTE — Telephone Encounter (Signed)
Pt called c/o breast lump and requested diag. Mammogram. Pt was told to make appointment due to new problem, then test can be order.

## 2011-08-07 ENCOUNTER — Ambulatory Visit (INDEPENDENT_AMBULATORY_CARE_PROVIDER_SITE_OTHER): Payer: Medicare PPO | Admitting: Gynecology

## 2011-08-07 ENCOUNTER — Encounter: Payer: Self-pay | Admitting: Gynecology

## 2011-08-07 VITALS — BP 128/76

## 2011-08-07 DIAGNOSIS — N6310 Unspecified lump in the right breast, unspecified quadrant: Secondary | ICD-10-CM

## 2011-08-07 DIAGNOSIS — N63 Unspecified lump in unspecified breast: Secondary | ICD-10-CM

## 2011-08-07 NOTE — Progress Notes (Signed)
Patient is a 66 year old who stated this morning she noticed a lump on her right breast. She states she does her monthly self breast examination. Her last mammogram was normal November 2011. Patient denies any family history of any breast malignancy. She did state that several years ago she did have needle aspiration for cyst on the right breast. She denied any nipple discharge. She has a history of 1982 a total abdominal hysterectomy with bilateral salpingo-oophorectomy. She is currently on Estrace 0.5 mg daily.  Physical Exam  Pulmonary/Chest:     Both breasts were examined sitting supine position. There was no supraclavicular axillary lymphadenopathy. Left breast no palpable masses or tenderness or nipple discharge. Right breast at the 7:00 position periareolar region there was a 1-2 cm mobile mass nontender.  After the patient was counseled a fine-needle aspiration was attempted with a 20-gauge needle after the area was cleansed with alcohol no fluid was obtained and the mass did not decompress. She will be sent for diagnostic mammogram of the right breast with possible ultrasound for further assessment and management.

## 2011-08-07 NOTE — Patient Instructions (Signed)
The office will call you to give you the time and date for the diagnostic mammogram next week.

## 2011-08-09 ENCOUNTER — Telehealth: Payer: Self-pay | Admitting: *Deleted

## 2011-08-09 ENCOUNTER — Other Ambulatory Visit: Payer: Self-pay | Admitting: *Deleted

## 2011-08-09 DIAGNOSIS — N6310 Unspecified lump in the right breast, unspecified quadrant: Secondary | ICD-10-CM

## 2011-08-09 NOTE — Telephone Encounter (Signed)
Message copied by Libby Maw on Fri Aug 09, 2011 12:27 PM ------      Message from: Ok Edwards      Created: Wed Aug 07, 2011  4:35 PM       Julus Kelley, please schedule diagnostic mammogram and possible ultrasound of right breast on this patient with mass. See my encounter note from today April 17 for details. Thank you

## 2011-08-09 NOTE — Telephone Encounter (Signed)
Patient informed appt set up with [facility] on 08/12/11 @1 :10pm.

## 2011-08-12 ENCOUNTER — Ambulatory Visit
Admission: RE | Admit: 2011-08-12 | Discharge: 2011-08-12 | Disposition: A | Discharge status: Home / Self Care | Payer: Medicare PPO | Source: Ambulatory Visit | Attending: Gynecology | Admitting: Gynecology

## 2011-08-12 DIAGNOSIS — N6310 Unspecified lump in the right breast, unspecified quadrant: Secondary | ICD-10-CM

## 2012-06-10 ENCOUNTER — Ambulatory Visit (INDEPENDENT_AMBULATORY_CARE_PROVIDER_SITE_OTHER): Payer: Medicare PPO | Admitting: Women's Health

## 2012-06-10 ENCOUNTER — Encounter: Payer: Self-pay | Admitting: Women's Health

## 2012-06-10 VITALS — BP 104/60 | Ht 62.0 in | Wt 142.0 lb

## 2012-06-10 DIAGNOSIS — M949 Disorder of cartilage, unspecified: Secondary | ICD-10-CM

## 2012-06-10 DIAGNOSIS — M899 Disorder of bone, unspecified: Secondary | ICD-10-CM

## 2012-06-10 DIAGNOSIS — M8580 Other specified disorders of bone density and structure, unspecified site: Secondary | ICD-10-CM

## 2012-06-10 NOTE — Progress Notes (Signed)
Natalie [name] 1945-10-17 [id number]    History:    The patient presents for breast and pelvic exam. Postmenopausal on half  tablet of 0.5 estradiol with good relief of symptoms. States increased hot flushes and poor sleep when off.TAH with BSO 1982. Hypertension/hypercholesterolemia-primary care labs and meds. Bone density 03/2010 T score AP spine - 0.9, bilateral hip average -1.4 with stability noted from previous DEXAs. FRAX 16%/2.3%. History of normal Paps and mammograms. Had Pneumovax vaccine, not zostavac. Benign colon polyp 08/2011.   Past medical history, past surgical history, family history and social history were all reviewed and documented in the EPIC chart. Celebrating 48 year anniversary.  2 grown children doing well.   ROS:  A  ROS was performed and pertinent positives and negatives are included in the history.  Exam:  Filed Vitals:   06/10/12 1114  BP: 104/60    General appearance:  Normal Head/Neck:  Normal, without cervical or supraclavicular adenopathy. Thyroid:  Symmetrical, normal in size, without palpable masses or nodularity. Respiratory  Effort:  Normal  Auscultation:  Clear without wheezing or rhonchi Cardiovascular  Auscultation:  Regular rate, without rubs, murmurs or gallops  Edema/varicosities:  Not grossly evident Abdominal  Soft,nontender, without masses, guarding or rebound.  Liver/spleen:  No organomegaly noted  Hernia:  None appreciated  Skin  Inspection:  Grossly normal  Palpation:  Grossly normal Neurologic/psychiatric  Orientation:  Normal with appropriate conversation.  Mood/affect:  Normal  Genitourinary    Breasts: Examined lying and sitting.     Right: Without masses, retractions, discharge or axillary adenopathy.     Left: Without masses, retractions, discharge or axillary adenopathy.   Inguinal/mons:  Normal without inguinal adenopathy  External genitalia:  Normal  BUS/Urethra/Skene's glands:  Normal  Bladder:  Normal  Vagina:   Normal  Cervix:  absent  Uterus:  absent  Adnexa/parametria:     Rt: Without masses or tenderness.   Lt: Without masses or tenderness.  Anus and perineum: Normal  Digital rectal exam: Normal sphincter tone without palpated masses or tenderness  Assessment/Plan:  67 y.o. M. WF G4 P2 for breast and pelvic exam.  TAH/BSO on 0.25 estradiol daily Osteopenia T score bilateral hip average -1.4 Hypertension/hypercholesterolemia primary care labs and meds  Plan: Estradiol 0.5 mg half tablet daily, reviewed slight risk for blood clots, strokes, breast cancer, prescription given. SBE's, continue annual mammogram, calcium rich diet, vitamin D 2000 daily encouraged. Repeat DEXA, will schedule. Home safety and fall prevention discussed. Zostavac encouraged.    Harrington Challenger [facility], 1:26 PM 06/10/2012

## 2012-06-10 NOTE — Patient Instructions (Addendum)

## 2012-08-24 ENCOUNTER — Other Ambulatory Visit: Payer: Self-pay

## 2012-08-24 DIAGNOSIS — Z780 Asymptomatic menopausal state: Secondary | ICD-10-CM

## 2012-08-24 MED ORDER — ESTRADIOL 0.5 MG PO TABS: 0.5000 mg | ORAL_TABLET | Freq: Every day | ORAL | Status: DC

## 2012-08-26 ENCOUNTER — Other Ambulatory Visit: Payer: Self-pay

## 2012-08-26 DIAGNOSIS — Z780 Asymptomatic menopausal state: Secondary | ICD-10-CM

## 2012-08-26 MED ORDER — ESTRADIOL 0.5 MG PO TABS: 0.5000 mg | ORAL_TABLET | Freq: Every day | ORAL | Status: DC

## 2012-09-07 ENCOUNTER — Telehealth: Payer: Self-pay | Admitting: *Deleted

## 2012-09-07 DIAGNOSIS — Z780 Asymptomatic menopausal state: Secondary | ICD-10-CM

## 2012-09-07 MED ORDER — ESTRADIOL 0.5 MG PO TABS: 0.5000 mg | ORAL_TABLET | Freq: Every day | ORAL | Status: DC

## 2012-09-07 NOTE — Telephone Encounter (Signed)
Pt called requesting refill on estradiol 0.5 mg tablet take 1/2 tablet daily. rx called in with 3 refills

## 2012-10-01 ENCOUNTER — Other Ambulatory Visit: Payer: Self-pay | Admitting: Gynecology

## 2012-10-01 DIAGNOSIS — M8580 Other specified disorders of bone density and structure, unspecified site: Secondary | ICD-10-CM

## 2012-11-03 ENCOUNTER — Telehealth: Payer: Self-pay | Admitting: *Deleted

## 2012-11-03 ENCOUNTER — Encounter: Payer: Self-pay | Admitting: Gynecology

## 2012-11-03 ENCOUNTER — Ambulatory Visit (INDEPENDENT_AMBULATORY_CARE_PROVIDER_SITE_OTHER): Payer: Medicare PPO

## 2012-11-03 DIAGNOSIS — M899 Disorder of bone, unspecified: Secondary | ICD-10-CM

## 2012-11-03 DIAGNOSIS — M8580 Other specified disorders of bone density and structure, unspecified site: Secondary | ICD-10-CM

## 2012-11-03 DIAGNOSIS — M949 Disorder of cartilage, unspecified: Secondary | ICD-10-CM

## 2012-11-03 NOTE — Telephone Encounter (Signed)
Message copied by Aura Camps on Tue Nov 03, 2012  2:12 PM ------      Message from: Dara Lords      Created: Tue Nov 03, 2012 11:57 AM       Tell patient her bone density shows increased risk of fracture and I recommend office visit to discuss treatment options. ------

## 2012-11-03 NOTE — Telephone Encounter (Signed)
Pt informed with the below note, transferred to front desk.  

## 2012-11-10 ENCOUNTER — Encounter: Payer: Self-pay | Admitting: Gynecology

## 2012-11-10 ENCOUNTER — Ambulatory Visit (INDEPENDENT_AMBULATORY_CARE_PROVIDER_SITE_OTHER): Payer: Medicare PPO | Admitting: Gynecology

## 2012-11-10 DIAGNOSIS — IMO0002 Reserved for concepts with insufficient information to code with codable children: Secondary | ICD-10-CM

## 2012-11-10 DIAGNOSIS — N898 Other specified noninflammatory disorders of vagina: Secondary | ICD-10-CM

## 2012-11-10 DIAGNOSIS — N952 Postmenopausal atrophic vaginitis: Secondary | ICD-10-CM

## 2012-11-10 DIAGNOSIS — M899 Disorder of bone, unspecified: Secondary | ICD-10-CM

## 2012-11-10 DIAGNOSIS — N899 Noninflammatory disorder of vagina, unspecified: Secondary | ICD-10-CM

## 2012-11-10 DIAGNOSIS — R300 Dysuria: Secondary | ICD-10-CM

## 2012-11-10 DIAGNOSIS — M8580 Other specified disorders of bone density and structure, unspecified site: Secondary | ICD-10-CM

## 2012-11-10 DIAGNOSIS — N951 Menopausal and female climacteric states: Secondary | ICD-10-CM

## 2012-11-10 LAB — WET PREP FOR TRICH, YEAST, CLUE: Yeast Wet Prep HPF POC: NONE SEEN

## 2012-11-10 NOTE — Patient Instructions (Signed)
Start on the estrogen patches for 3 weeks. Call me at the end of the third week to see how you're doing.

## 2012-11-10 NOTE — Progress Notes (Signed)
Patient presents with several issues: 1. Osteopenia. DEXA 10/2012 with T score -2.1 left femoral neck FRAX 19%/3.6%. Comparison to baseline 2005 values have been stable/improved at both right and left hips and spine. 2. Hot flushes primarily in the morning, vaginal dryness and irritation, dyspareunia. Had been on ERT since TAH/BSO in 1982. Initially Premarin and now Estrace 0.5 mg. Patient is to the past year the onset of the above symptoms worsening over time. 3. Some dysuria over the past several months. No urgency frequency or pelvic/suprapubic pain.  Exam with Selena Batten assistant Spine straight without CVA tenderness Abdomen soft nontender without masses guarding rebound organomegaly. Pelvic external BUS vagina with atrophic changes. Bimanual without masses or tenderness.  Assessment and plan: 1. Osteopenia. I reviewed her increased FRAX fracture risk of the hip at 3.6%. Her bone density was stable from her baseline study in 2005. Options for continued observation with repeat DEXA in 2 years recognizing a slightly higher risk of fracture versus initiating treatment now such as bisphosphate reviewed. Patient would really prefer to wait and followup DEXA in 2 years and then make a treatment decision at that point. She's active, stable on her feet takes extra calcium and vitamin D and is currently on ERT. Will check TSH and vitamin D level. 2. Menopausal symptoms with hot flashes vaginal dryness dyspareunia. Wet prep is negative. Exam consistent with atrophic changes. Is currently on estradiol 0.5 mg.  I reviewed the whole issue of HRT with her to include the WHI study with increased risk of stroke, heart attack, DVT and breast cancer. The ACOG and NAMS statements for lowest dose for the shortest period of time reviewed. Transdermal versus oral first-pass effect benefit discussed. Patient wants to continue on her ERT. I suggested a trial of Minivelle 0.1 mg patches to see how she'll do and how this addresses  all of her symptoms. 3 week sample given. She'll call me at the end of the third week and will see how she's doing. 3. Mild dysuria. I suspect is related to atrophic changes. Will check urinalysis to rule out UTI. Will reassess after using higher dose of estrogen.

## 2012-11-11 LAB — URINALYSIS W MICROSCOPIC + REFLEX CULTURE
Bacteria, UA: NONE SEEN
Casts: NONE SEEN
Hgb urine dipstick: NEGATIVE
Ketones, ur: NEGATIVE mg/dL
Nitrite: NEGATIVE
Urobilinogen, UA: 0.2 mg/dL (ref 0.0–1.0)

## 2012-11-11 LAB — VITAMIN D 25 HYDROXY (VIT D DEFICIENCY, FRACTURES): Vit D, 25-Hydroxy: 47 ng/mL (ref 30–89)

## 2012-11-11 LAB — TSH: TSH: 1.437 u[IU]/mL (ref 0.350–4.500)

## 2012-11-12 LAB — URINE CULTURE

## 2012-12-02 ENCOUNTER — Telehealth: Payer: Self-pay | Admitting: *Deleted

## 2012-12-02 MED ORDER — ESTRADIOL 0.1 MG/24HR TD PTTW: 1.0000 | MEDICATED_PATCH | TRANSDERMAL | Status: DC

## 2012-12-02 NOTE — Telephone Encounter (Signed)
FYI Pt was given samples on OV 11/10/12 of minville 0.1 mg and it has been working well she would like Rx sent to pharmacy. rx will be sent.

## 2012-12-02 NOTE — Telephone Encounter (Signed)
Okay to refill through her February 2015 exam due date

## 2012-12-02 NOTE — Telephone Encounter (Signed)
30 day supply sent to local pharmacy. [facility] this medication required prior authorization.  Form will be faxed and to [employer].

## 2012-12-03 MED ORDER — ESTRADIOL 0.1 MG/24HR TD PTTW: 1.0000 | MEDICATED_PATCH | TRANSDERMAL | Status: DC

## 2012-12-03 NOTE — Addendum Note (Signed)
Addended by: Aura Camps on: 12/03/2012 02:44 PM   Modules accepted: Orders

## 2012-12-03 NOTE — Telephone Encounter (Addendum)
minivelle 01.mg patch approved until 04/21/2013., pt asked for 90 day supply, [facility] with [facility] refills

## 2012-12-07 ENCOUNTER — Telehealth: Payer: Self-pay | Admitting: *Deleted

## 2012-12-07 NOTE — Telephone Encounter (Signed)
Pt was prescribed minville 0.1 mg patch on 12/02/12 and has found out that this Rx is too expensive at $ 80.00 for 3 month supply. Pt asked if there are any other options? Please advise

## 2012-12-07 NOTE — Telephone Encounter (Signed)
Pt informed with the below note, pt said she will call back once she makes a decision on what medication.

## 2012-12-07 NOTE — Telephone Encounter (Signed)
Options would be to try generic estradiol 0.1 mg patch or go back on estradiol orally at 1 mg daily. We discussed that the oral form slightly increases the risk of blood clots like stroke heart attack and deep venous thromboses.

## 2013-01-01 ENCOUNTER — Telehealth: Payer: Self-pay | Admitting: *Deleted

## 2013-01-01 MED ORDER — ESTRADIOL 1 MG PO TABS: 1.0000 mg | ORAL_TABLET | Freq: Every day | ORAL | Status: DC

## 2013-01-01 NOTE — Telephone Encounter (Signed)
Pt called to follow up from telephone encounter on 12/07/12 pt decided  to have estradiol 1 mg tablets called [facility] with 1 refill per note on 12/07/12 this was called into pharmacy. Pt is aware the increase risk of blood clots like stroke heart attack and deep venous thromboses.

## 2013-07-06 ENCOUNTER — Other Ambulatory Visit: Payer: Self-pay | Admitting: Gynecology

## 2013-11-29 ENCOUNTER — Other Ambulatory Visit: Payer: Self-pay | Admitting: Gynecology

## 2014-01-05 ENCOUNTER — Encounter: Payer: Self-pay | Admitting: Women's Health

## 2014-01-05 ENCOUNTER — Ambulatory Visit (INDEPENDENT_AMBULATORY_CARE_PROVIDER_SITE_OTHER): Payer: Medicare PPO | Admitting: Women's Health

## 2014-01-05 VITALS — BP 124/80 | Ht 61.75 in | Wt 143.8 lb

## 2014-01-05 DIAGNOSIS — M899 Disorder of bone, unspecified: Secondary | ICD-10-CM

## 2014-01-05 DIAGNOSIS — Z79890 Hormone replacement therapy (postmenopausal): Secondary | ICD-10-CM

## 2014-01-05 DIAGNOSIS — M8580 Other specified disorders of bone density and structure, unspecified site: Secondary | ICD-10-CM

## 2014-01-05 DIAGNOSIS — M949 Disorder of cartilage, unspecified: Secondary | ICD-10-CM

## 2014-01-05 MED ORDER — ESTRADIOL 1 MG PO TABS: ORAL_TABLET | ORAL | Status: DC

## 2014-01-05 NOTE — Patient Instructions (Signed)
Health Recommendations for Postmenopausal Women Respected and ongoing research has looked at the most common causes of death, disability, and poor quality of life in postmenopausal women. The causes include heart disease, diseases of blood vessels, diabetes, depression, cancer, and bone loss (osteoporosis). Many things can be done to help lower the chances of developing these and other common problems. CARDIOVASCULAR DISEASE Heart Disease: A heart attack is a medical emergency. Know the signs and symptoms of a heart attack. Below are things women can do to reduce their risk for heart disease.   Do not smoke. If you smoke, quit.  Aim for a healthy weight. Being overweight causes many preventable deaths. Eat a healthy and balanced diet and drink an adequate amount of liquids.  Get moving. Make a commitment to be more physically active. Aim for 30 minutes of activity on most, if not all days of the week.  Eat for heart health. Choose a diet that is low in saturated fat and cholesterol and eliminate trans fat. Include whole grains, vegetables, and fruits. Read and understand the labels on food containers before buying.  Know your numbers. Ask your caregiver to check your blood pressure, cholesterol (total, HDL, LDL, triglycerides) and blood glucose. Work with your caregiver on improving your entire clinical picture.  High blood pressure. Limit or stop your table salt intake (try salt substitute and food seasonings). Avoid salty foods and drinks. Read labels on food containers before buying. Eating well and exercising can help control high blood pressure. STROKE  Stroke is a medical emergency. Stroke may be the result of a blood clot in a blood vessel in the brain or by a brain hemorrhage (bleeding). Know the signs and symptoms of a stroke. To lower the risk of developing a stroke:  Avoid fatty foods.  Quit smoking.  Control your diabetes, blood pressure, and irregular heart rate. THROMBOPHLEBITIS  (BLOOD CLOT) OF THE LEG  Becoming overweight and leading a stationary lifestyle may also contribute to developing blood clots. Controlling your diet and exercising will help lower the risk of developing blood clots. CANCER SCREENING  Breast Cancer: Take steps to reduce your risk of breast cancer.  You should practice "breast self-awareness." This means understanding the normal appearance and feel of your breasts and should include breast self-examination. Any changes detected, no matter how small, should be reported to your caregiver.  After age 40, you should have a clinical breast exam (CBE) every year.  Starting at age 40, you should consider having a mammogram (breast X-ray) every year.  If you have a family history of breast cancer, talk to your caregiver about genetic screening.  If you are at high risk for breast cancer, talk to your caregiver about having an MRI and a mammogram every year.  Intestinal or Stomach Cancer: Tests to consider are a rectal exam, fecal occult blood, sigmoidoscopy, and colonoscopy. Women who are high risk may need to be screened at an earlier age and more often.  Cervical Cancer:  Beginning at age 30, you should have a Pap test every 3 years as long as the past 3 Pap tests have been normal.  If you have had past treatment for cervical cancer or a condition that could lead to cancer, you need Pap tests and screening for cancer for at least 20 years after your treatment.  If you had a hysterectomy for a problem that was not cancer or a condition that could lead to cancer, then you no longer need Pap tests.    If you are between ages 65 and 70, and you have had normal Pap tests going back 10 years, you no longer need Pap tests.  If Pap tests have been discontinued, risk factors (such as a new sexual partner) need to be reassessed to determine if screening should be resumed.  Some medical problems can increase the chance of getting cervical cancer. In these  cases, your caregiver may recommend more frequent screening and Pap tests.  Uterine Cancer: If you have vaginal bleeding after reaching menopause, you should notify your caregiver.  Ovarian Cancer: Other than yearly pelvic exams, there are no reliable tests available to screen for ovarian cancer at this time except for yearly pelvic exams.  Lung Cancer: Yearly chest X-rays can detect lung cancer and should be done on high risk women, such as cigarette smokers and women with chronic lung disease (emphysema).  Skin Cancer: A complete body skin exam should be done at your yearly examination. Avoid overexposure to the sun and ultraviolet light lamps. Use a strong sun block cream when in the sun. All of these things are important for lowering the risk of skin cancer. MENOPAUSE Menopause Symptoms: Hormone therapy products are effective for treating symptoms associated with menopause:  Moderate to severe hot flashes.  Night sweats.  Mood swings.  Headaches.  Tiredness.  Loss of sex drive.  Insomnia.  Other symptoms. Hormone replacement carries certain risks, especially in older women. Women who use or are thinking about using estrogen or estrogen with progestin treatments should discuss that with their caregiver. Your caregiver will help you understand the benefits and risks. The ideal dose of hormone replacement therapy is not known. The Food and Drug Administration (FDA) has concluded that hormone therapy should be used only at the lowest doses and for the shortest amount of time to reach treatment goals.  OSTEOPOROSIS Protecting Against Bone Loss and Preventing Fracture If you use hormone therapy for prevention of bone loss (osteoporosis), the risks for bone loss must outweigh the risk of the therapy. Ask your caregiver about other medications known to be safe and effective for preventing bone loss and fractures. To guard against bone loss or fractures, the following is recommended:  If  you are younger than age 50, take 1000 mg of calcium and at least 600 mg of Vitamin D per day.  If you are older than age 50 but younger than age 70, take 1200 mg of calcium and at least 600 mg of Vitamin D per day.  If you are older than age 70, take 1200 mg of calcium and at least 800 mg of Vitamin D per day. Smoking and excessive alcohol intake increases the risk of osteoporosis. Eat foods rich in calcium and vitamin D and do weight bearing exercises several times a week as your caregiver suggests. DIABETES Diabetes Mellitus: If you have type I or type 2 diabetes, you should keep your blood sugar under control with diet, exercise, and recommended medication. Avoid starchy and fatty foods, and too many sweets. Being overweight can make diabetes control more difficult. COGNITION AND MEMORY Cognition and Memory: Menopausal hormone therapy is not recommended for the prevention of cognitive disorders such as Alzheimer's disease or memory loss.  DEPRESSION  Depression may occur at any age, but it is common in elderly women. This may be because of physical, medical, social (loneliness), or financial problems and needs. If you are experiencing depression because of medical problems and control of symptoms, talk to your caregiver about this. Physical   activity and exercise may help with mood and sleep. Community and volunteer involvement may improve your sense of value and worth. If you have depression and you feel that the problem is getting worse or becoming severe, talk to your caregiver about which treatment options are best for you. ACCIDENTS  Accidents are common and can be serious in elderly woman. Prepare your house to prevent accidents. Eliminate throw rugs, place hand bars in bath, shower, and toilet areas. Avoid wearing high heeled shoes or walking on wet, snowy, and icy areas. Limit or stop driving if you have vision or hearing problems, or if you feel you are unsteady with your movements and  reflexes. HEPATITIS C Hepatitis C is a type of viral infection affecting the liver. It is spread mainly through contact with blood from an infected person. It can be treated, but if left untreated, it can lead to severe liver damage over the years. Many people who are infected do not know that the virus is in their blood. If you are a "baby-boomer", it is recommended that you have one screening test for Hepatitis C. IMMUNIZATIONS  Several immunizations are important to consider having during your senior years, including:   Tetanus, diphtheria, and pertussis booster shot.  Influenza every year before the flu season begins.  Pneumonia vaccine.  Shingles vaccine.  Others, as indicated based on your specific needs. Talk to your caregiver about these. Document Released: 05/31/2005 Document Revised: 08/23/2013 Document Reviewed: 01/25/2008 Exit[employer] Patient Information 2015 [employer]. This information is not intended to replace advice given to you by your health care provider. Make sure you discuss any questions you have with your health care provider.  

## 2014-01-05 NOTE — Progress Notes (Signed)
Natalie [name] 1945-06-02 [id number]    History:    Presents for breast and pelvic exam. 1982 TAH with BSO for dysfunctional uterine bleeding on estradiol 1 mg has tried to wean off and unable due to numerous hot flushes. Normal Pap and mammogram history. 2014 Osteopenic T score -2.1 left femoral hip with FRAX 19%/3.6%. Declines bisphosphonate will continue with regular exercise and estradiol. Negative colonoscopy 2013. Has had pneumonia vaccine not shingles. Hypertension/hypercholesterolemia primary care manages.  Past medical history, past surgical history, family history and social history were all reviewed and documented in the EPIC chart. Has 2 children, 2 grandchildren all doing well. Husband healthy.  ROS:  A  12 point ROS was performed and pertinent positives and negatives are included.  Exam:  Filed Vitals:   01/05/14 1033  BP: 124/80    General appearance:  Normal Thyroid:  Symmetrical, normal in size, without palpable masses or nodularity. Respiratory  Auscultation:  Clear without wheezing or rhonchi Cardiovascular  Auscultation:  Regular rate, without rubs, murmurs or gallops  Edema/varicosities:  Not grossly evident Abdominal  Soft,nontender, without masses, guarding or rebound.  Liver/spleen:  No organomegaly noted  Hernia:  None appreciated  Skin  Inspection:  Grossly normal   Breasts: Examined lying and sitting.     Right: Without masses, retractions, discharge or axillary adenopathy.     Left: Without masses, retractions, discharge or axillary adenopathy. Gentitourinary   Inguinal/mons:  Normal without inguinal adenopathy  External genitalia:  Normal  BUS/Urethra/Skene's glands:  Normal  Vagina:  Normal  Cervix:  Absent  Uterus:  Absent  Adnexa/parametria:     Rt: Without masses or tenderness.   Lt: Without masses or tenderness.  Anus and perineum: Normal  Digital rectal exam: Normal sphincter tone without palpated masses or tenderness  Assessment/Plan:   68 y.o. MWF G2P2 for rest and pelvic exam.  TAH with BSO on HRT Osteopenia with elevated FRAX Hypertension/hypercholesterolemia-primary care manages labs and meds  Plan: Home safety, fall prevention discussed, declines bisphosphonate, estradiol 1 mg by mouth daily prescription, proper use given and reviewed risks of blood clots, strokes, breast cancer, discussed importance of decreasing dose, states has numerous hot flushes. SBE's, annual mammogram, overdue instructed to schedule. Encouraged Zostavac.  Note: This dictation was prepared with Dragon/digital dictation.  Any transcriptional errors that result are unintentional. Huel Cote [facility], 11:01 AM 01/05/2014

## 2014-02-21 ENCOUNTER — Encounter: Payer: Self-pay | Admitting: Women's Health

## 2014-06-13 ENCOUNTER — Other Ambulatory Visit: Payer: Self-pay | Admitting: Dermatology

## 2014-07-13 ENCOUNTER — Ambulatory Visit (INDEPENDENT_AMBULATORY_CARE_PROVIDER_SITE_OTHER): Payer: Medicare PPO

## 2014-07-13 ENCOUNTER — Ambulatory Visit (INDEPENDENT_AMBULATORY_CARE_PROVIDER_SITE_OTHER): Payer: Medicare PPO | Admitting: Podiatrist

## 2014-07-13 ENCOUNTER — Encounter: Payer: Self-pay | Admitting: Podiatrist

## 2014-07-13 VITALS — BP 119/64 | HR 100 | Resp 12

## 2014-07-13 DIAGNOSIS — S92352A Displaced fracture of fifth metatarsal bone, left foot, initial encounter for closed fracture: Secondary | ICD-10-CM

## 2014-07-13 DIAGNOSIS — R52 Pain, unspecified: Secondary | ICD-10-CM | POA: Diagnosis not present

## 2014-07-13 NOTE — Progress Notes (Signed)
   Subjective:    Patient ID: Natalie [name], female    DOB: November 04, 1945, 69 y.o.   MRN: [id number]  HPI  PT STATED INJURED LT FOOT 3 DAYS AGO TWISTING THE ANKLE AND STILL PAINFUL AND HAVE BRUISE. THE FOOT IS GETTING WORSE/SWOLLEN AND TRIED USING ICE, ACE BANDAGE, AND TYLENOL.  Patient was [place] on [place] cobbl[place] street on vacation and twisted her foot--  She happens to have a friend of her husband who is a podiatrist who xrayed her foot and told her it was broken.  No treatment rendered.    Review of Systems  Eyes: Positive for visual disturbance.  Cardiovascular: Positive for leg swelling.  Musculoskeletal: Positive for gait problem.  Neurological: Positive for headaches.  Hematological: Bruises/bleeds easily.  All other systems reviewed and are negative.      Objective:   Physical Exam Patient is awake, alert, and oriented x 3.  In no acute distress.  Vascular status is intact with palpable pedal pulses at 2/4 DP and PT bilateral and capillary refill time within normal limits. Neurological sensation is also intact bilaterally via Semmes Weinstein monofilament at 5/5 sites. Light touch, vibratory sensation, Achilles tendon reflex is intact. Dermatological exam reveals skin color, turger and texture as normal. No open lesions present.  Musculature intact with dorsiflexion, plantarflexion, inversion, eversion.  Pain on palpation left fifth metatarsal base is noted clinically.  Swelling and mild ecchymosis is also present.  Patient presents in an athletic shoe and no boot was dispensed.  xrays show a fracture of the fifth metatarsal base- avulsion type fracture with 54mm of gapping between fragments.  Swelling along lateral foot also seen on xray.      Assessment & Plan:  Fifth metatarsal base fracture- left-  Initial encounter  Plan:  Discussed the xray findings and conservative versus surgical treatments.  Discussed stabilizing the fracture with a screw versus putting her in a boot and  keeping her off the foot.  She would prefer to try the boot first.  Due to the extent of the fracture, I recommended a bone stimulator.  Will contact Donley Redder to see if she is a candidate.  Will re evaulate in 4 weeks and decide at that time if surgery is necessary.  Discussed healing time will be at least 12 weeks and she will need to wear the boot during this time of healing.

## 2014-07-13 NOTE — Patient Instructions (Addendum)
You have sustained an avulsion fracture of your fifth metatarsal base.  These are notoriously slow to heal.  They usually take 10-12 weeks to heal and thus often require a boot and decreased activity for this timespan.  I will order a bone stimulator for you to use.  Lytle Michaels will call you about the bone scan and if it has been approved by your insurance.

## 2014-07-18 ENCOUNTER — Telehealth: Payer: Self-pay | Admitting: *Deleted

## 2014-07-18 NOTE — Telephone Encounter (Addendum)
Pt request status of prior authorization for Bone Growth Stimulator.  I informed pt the pt coordinator would call tomorrow with up date on Bone Growth Stimulator.  Pt states she would also like a different pain medication, other than Tylenol.  I told the pt she could take OTC Ibuprofen as directed between the Tylenol, if she was able to take if for other pain management, and I would see if the doctor on call would prescribe a different pain medication and call again tomorrow.  Dr. Leigh Aurora orders called to pt and informed pt that the Bone Growth Stimulator representative Lytle Michaels was currently processing for her equipment.

## 2014-07-19 NOTE — Telephone Encounter (Signed)
We can try tylenol #3 1 tab PO q 4-6 hr prn pain disp #30. She should not take any other tylenol with it.  If she needs a bone stimulator let me know and I can call Lytle Michaels. Dr. Johnette Abraham has not dictated her note yet so not exactly sure what she wants. Thanks.

## 2014-07-20 ENCOUNTER — Encounter: Payer: Self-pay | Admitting: Podiatrist

## 2014-07-20 MED ORDER — ACETAMINOPHEN-CODEINE #3 300-30 MG PO TABS: 1.0000 | ORAL_TABLET | ORAL | Status: DC | PRN

## 2014-07-21 NOTE — Telephone Encounter (Signed)
Yes, I do want her to get a bone stim-- check with Delydia first before calling Lytle Michaels as I'm pretty sure I sent her a message to get one ordered for her.  She was probably waiting on the note--  Thanks!

## 2014-07-25 ENCOUNTER — Telehealth: Payer: Self-pay | Admitting: *Deleted

## 2014-07-25 NOTE — Telephone Encounter (Signed)
"  I'm calling to check on the status of the bone stimulator that Dr. [name] wants me to use.  I was told that it wasn't covered so I called my insurance company and they said it is covered.  What's going on?  I was told it needed to be signed or something by Dr. [name]."  Natalie [name] from [facility] is taking care of it.  He is aware of what is needed.  Dr. [name] has been out of the office all last week.  She will be here on Wednesday.  "Can someone else go ahead and sign off on it for her?"  No, she will have to sign whatever needs to be done.  We are not aware of what's needed.  Natalie [name] takes care of the authorization process.  He'll let us know when it has been taken care of and we'll contact you.  "Okay, I was just hoping we could speed it up, it's been 2 weeks."

## 2014-07-27 ENCOUNTER — Telehealth: Payer: Self-pay | Admitting: Podiatrist

## 2014-07-27 NOTE — Telephone Encounter (Signed)
PT CALLED AND GAVE ME INS INFO AND FAX NUMBER FOR THE PAPERWORK TO BE SENT TO.tHE PENDING # IS [phone] AND THE FAX # IS [phone] REQ IF WE CALL HER TODAY  TO  CALL BACK ON HER CELL.

## 2014-08-04 ENCOUNTER — Telehealth: Payer: Self-pay | Admitting: *Deleted

## 2014-08-04 NOTE — Telephone Encounter (Signed)
"  Reference number [id number], cased was reviewed by medical director and was not approved for bone stimulator.  It doesn't meet medical necessity.  Can do a peer to peer if doctor would like.  You normally have to take initial film then have a 90 day span then another film taken.  Call within 24 hours to appeal.

## 2014-08-04 NOTE — Telephone Encounter (Signed)
I called to inform patient that her bone stimulator was denied by her insurance.  "I know, I received a call.  I don't think it would do any good to appeal it."  No, you have to have received treatment for at least 90 days.  "Can you tell me when my next appointment is?"  It's scheduled for 08/10/2014 at 2:15pm.  "Okay, thank you."

## 2014-08-10 ENCOUNTER — Encounter: Payer: Self-pay | Admitting: Podiatrist

## 2014-08-10 ENCOUNTER — Ambulatory Visit (INDEPENDENT_AMBULATORY_CARE_PROVIDER_SITE_OTHER): Payer: Medicare PPO

## 2014-08-10 ENCOUNTER — Ambulatory Visit (INDEPENDENT_AMBULATORY_CARE_PROVIDER_SITE_OTHER): Payer: Medicare PPO | Admitting: Podiatrist

## 2014-08-10 VITALS — BP 126/77 | HR 74 | Resp 15

## 2014-08-10 DIAGNOSIS — S92302D Fracture of unspecified metatarsal bone(s), left foot, subsequent encounter for fracture with routine healing: Secondary | ICD-10-CM

## 2014-08-10 DIAGNOSIS — M79672 Pain in left foot: Secondary | ICD-10-CM | POA: Diagnosis not present

## 2014-08-10 NOTE — Progress Notes (Signed)
   Subjective:    Patient ID: Natalie [name], female    DOB: 03-24-1946, 69 y.o.   MRN: [id number]  Chief Complaint  Patient presents with  . Foot Pain    recheck left foot fx 5th met      Objective:   Physical Exam Neurovascular status intact and unchanged.  Continued swelling and moderate pain is still present left fifth metatarsal base.     xrays show improvement in fracture of the fifth metatarsal base- avulsion type fracture with 3.7 mm gapping between fragments.  Fracture lines appear to be healing in at a normal rate.  No deviation or dislocation is noted.      Assessment & Plan:  Fifth metatarsal base fracture- left-   Plan:  Again Discussed the xray findings and conservative versus surgical treatments. She will continue to wear her Cam Walker and we will check her foot in 4 weeks. We discussed that her insurance does not cover a bone stimulator for a fresh fracture. We'll watch the bone as it heals.

## 2014-09-07 ENCOUNTER — Encounter: Payer: Self-pay | Admitting: Podiatrist

## 2014-09-07 ENCOUNTER — Ambulatory Visit (INDEPENDENT_AMBULATORY_CARE_PROVIDER_SITE_OTHER): Payer: Medicare PPO

## 2014-09-07 ENCOUNTER — Ambulatory Visit (INDEPENDENT_AMBULATORY_CARE_PROVIDER_SITE_OTHER): Payer: Medicare PPO | Admitting: Podiatrist

## 2014-09-07 ENCOUNTER — Other Ambulatory Visit: Payer: Self-pay | Admitting: Podiatrist

## 2014-09-07 VITALS — BP 118/72 | HR 69 | Resp 12

## 2014-09-07 DIAGNOSIS — S92302D Fracture of unspecified metatarsal bone(s), left foot, subsequent encounter for fracture with routine healing: Secondary | ICD-10-CM

## 2014-09-07 NOTE — Progress Notes (Signed)
   Subjective:    Patient ID: Natalie [name], female    DOB: [birth year], 69 y.o.   MRN: [id number]  Chief Complaint  Patient presents with  . Foot Pain    ''LT FOOT IS DOING MUCH BETTER BUT THE TOES ARE RUBBING TOGETHER.''      Objective:   Physical Exam Neurovascular status intact and unchanged.  No swelling and no pain present left fifth metatarsal base.  No pain with compression of calf musculature left .    xrays show improvement in fracture of the fifth metatarsal base- avulsion type fracture   Fracture lines appear to be healing in at a normal rate.  No deviation or dislocation is noted.      Assessment & Plan:  Fifth metatarsal base fracture- left-   Plan:  Recommended continued use of the boot for 4 more weeks. She will be seen back at that time for follow up and re xray of the foot.  If any concerns arise she will call

## 2014-10-05 ENCOUNTER — Ambulatory Visit: Payer: Medicare PPO | Admitting: Podiatry

## 2014-10-12 ENCOUNTER — Encounter: Payer: Self-pay | Admitting: Podiatry

## 2014-10-12 ENCOUNTER — Ambulatory Visit (INDEPENDENT_AMBULATORY_CARE_PROVIDER_SITE_OTHER): Payer: Medicare PPO

## 2014-10-12 ENCOUNTER — Ambulatory Visit (INDEPENDENT_AMBULATORY_CARE_PROVIDER_SITE_OTHER): Payer: Medicare PPO | Admitting: Podiatry

## 2014-10-12 VITALS — BP 137/72 | HR 62 | Resp 15

## 2014-10-12 DIAGNOSIS — S92302D Fracture of unspecified metatarsal bone(s), left foot, subsequent encounter for fracture with routine healing: Secondary | ICD-10-CM

## 2014-10-12 DIAGNOSIS — S92302K Fracture of unspecified metatarsal bone(s), left foot, subsequent encounter for fracture with nonunion: Secondary | ICD-10-CM | POA: Diagnosis not present

## 2014-10-12 NOTE — Progress Notes (Signed)
Patient ID: Natalie [name], female   DOB: 1945/06/13, 69 y.o.   MRN: [id number]  Subjective:  69 year old female presents to the office today for follow-up evaluation of left 5th metatarsal base fracture. She states she has continued in the CAM walker. She does state she has been having some pain to the outside aspect of her foot. She denies any acute changes since last appointment and no other complaints at this time. Denies any systemic complaints such as fevers, chills, nausea, vomiting. Denies any calf pain, chest pain, shortness of breath.  Objective: AAO 3, NAD DP/PT pulses palpable, CRT less than 3 seconds Protective sensation intact with Simms Weinstein monofilament There is mild to palpation along the base of the left fifth metatarsal. There is no other areas of tenderness to bilateral lower extremities. There is mild overlying edema of the lateral aspect of the left foot without any associated erythema or increased warmth. There is no pain along the course of the peroneal tendons.  No open lesions or pre-ulcerative lesions. No pain with calf compression, swelling, warmth, erythema.   Assessment: 69 year old female left foot fifth metatarsal base fracture, nonunion  Plan: -X-rays were obtained and reviewed with the patient.  -Treatment options discussed including all alternatives, risks, and complications -At this time as the fracture does not appear to have any evidence of healing recommend bone stimulator. We will coordinate this for her.  -Continue in CAM boot -Ice and elevation -Follow-up in 4 weeks or sooner if any problems are to arise. In the meantime I encouraged to call the office with any questions, concerns, changes symptoms. Monitor for any signs or symptoms of DVT/PE to  go to the ER.

## 2014-10-17 ENCOUNTER — Ambulatory Visit: Payer: Medicare PPO | Admitting: Podiatry

## 2014-10-19 DIAGNOSIS — S92352A Displaced fracture of fifth metatarsal bone, left foot, initial encounter for closed fracture: Secondary | ICD-10-CM | POA: Diagnosis not present

## 2014-11-04 DIAGNOSIS — Z85828 Personal history of other malignant neoplasm of skin: Secondary | ICD-10-CM | POA: Diagnosis not present

## 2014-11-04 DIAGNOSIS — L304 Erythema intertrigo: Secondary | ICD-10-CM | POA: Diagnosis not present

## 2014-11-04 DIAGNOSIS — L821 Other seborrheic keratosis: Secondary | ICD-10-CM | POA: Diagnosis not present

## 2014-11-11 ENCOUNTER — Ambulatory Visit (INDEPENDENT_AMBULATORY_CARE_PROVIDER_SITE_OTHER): Payer: Medicare PPO

## 2014-11-11 ENCOUNTER — Ambulatory Visit (INDEPENDENT_AMBULATORY_CARE_PROVIDER_SITE_OTHER): Payer: Medicare PPO | Admitting: Podiatry

## 2014-11-11 ENCOUNTER — Encounter: Payer: Self-pay | Admitting: Podiatry

## 2014-11-11 VITALS — BP 103/78 | HR 69 | Resp 12

## 2014-11-11 DIAGNOSIS — S92302K Fracture of unspecified metatarsal bone(s), left foot, subsequent encounter for fracture with nonunion: Secondary | ICD-10-CM

## 2014-11-11 DIAGNOSIS — Z6826 Body mass index (BMI) 26.0-26.9, adult: Secondary | ICD-10-CM | POA: Diagnosis not present

## 2014-11-11 DIAGNOSIS — M25512 Pain in left shoulder: Secondary | ICD-10-CM | POA: Diagnosis not present

## 2014-11-11 NOTE — Progress Notes (Signed)
Patient ID: Natalie [name], female   DOB: 03/04/1946, 69 y.o.   MRN: [id number]  Subjective:  69 year old female presents to the office today for follow-up evaluation of left 5th metatarsal base fracture. She states she has continued in the CAM walker the majority the time of that she does not wear the boot in the mornings or around the house. She states that she is doing much better and she does not have any pain to the area. She denies any swelling or redness. Denies any systemic complaints such as fevers, chills, nausea, vomiting. Denies any calf pain, chest pain, shortness of breath.  Objective: AAO 3, NAD DP/PT pulses palpable, CRT less than 3 seconds Protective sensation intact with Simms Weinstein monofilament There is no tenderness to palpation along the base of the left fifth metatarsal. There is no other areas of tenderness to bilateral lower extremities. There is no specific edema overlying the lateral aspect of the foot. There is no erythema or increased warmth. There is no pain along the course of the peroneal tendons.  No open lesions or pre-ulcerative lesions. No pain with calf compression, swelling, warmth, erythema.   Assessment: 69 year old female left foot fifth metatarsal base fracture, nonunion  Plan: -X-rays were obtained and reviewed with the patient. There does appear to be some healing although the radial someone is still evident. -Treatment options discussed including all alternatives, risks, and complications -Continue bone sooner. -Continue in CAM boot. If she is not to wear the boot I recommended a tennis shoe with an ankle brace which was dispensed to her today. Although I do strongly recommend continuing the CAM walker at all times. -Ice and elevation -Follow-up in 4 weeks or sooner if any problems are to arise. In the meantime I encouraged to call the office with any questions, concerns, changes symptoms. Monitor for any signs or symptoms of DVT/PE to  go to the  ER.  Celesta Gentile, DPM

## 2014-11-29 DIAGNOSIS — M25512 Pain in left shoulder: Secondary | ICD-10-CM | POA: Diagnosis not present

## 2014-11-29 DIAGNOSIS — M1990 Unspecified osteoarthritis, unspecified site: Secondary | ICD-10-CM | POA: Diagnosis not present

## 2014-12-07 DIAGNOSIS — L918 Other hypertrophic disorders of the skin: Secondary | ICD-10-CM | POA: Diagnosis not present

## 2014-12-07 DIAGNOSIS — Z85828 Personal history of other malignant neoplasm of skin: Secondary | ICD-10-CM | POA: Diagnosis not present

## 2014-12-07 DIAGNOSIS — L820 Inflamed seborrheic keratosis: Secondary | ICD-10-CM | POA: Diagnosis not present

## 2014-12-09 ENCOUNTER — Ambulatory Visit (INDEPENDENT_AMBULATORY_CARE_PROVIDER_SITE_OTHER): Payer: Medicare PPO

## 2014-12-09 ENCOUNTER — Ambulatory Visit (INDEPENDENT_AMBULATORY_CARE_PROVIDER_SITE_OTHER): Payer: Medicare PPO | Admitting: Podiatry

## 2014-12-09 DIAGNOSIS — Z9889 Other specified postprocedural states: Secondary | ICD-10-CM

## 2014-12-09 DIAGNOSIS — S92302K Fracture of unspecified metatarsal bone(s), left foot, subsequent encounter for fracture with nonunion: Secondary | ICD-10-CM

## 2014-12-15 NOTE — Progress Notes (Signed)
Patient ID: LIS SAVITT, female   DOB: [birth year], 69 y.o.   MRN: [id number]  Subjective:  68 year old female presents to the office today for follow-up evaluation of left 5th metatarsal base fracture. She states she has continued in the CAM walker the majority the time and does state that she still does not wear the boot in the mornings or around the house. She states that she is an occasional pain overlying the area however she has improved. She denies any swelling or redness. Denies any systemic complaints such as fevers, chills, nausea, vomiting. Denies any calf pain, chest pain, shortness of breath.  Objective: AAO 3, NAD DP/PT pulses palpable, CRT less than 3 seconds Protective sensation intact with Simms Weinstein monofilament There is no tenderness to palpation along the base of the left fifth metatarsal. There are no other areas of tenderness to bilateral lower extremities. There is no edema overlying the lateral aspect of the foot. There is no erythema or increased warmth. There is no pain along the course of the peroneal tendons.  No open lesions or pre-ulcerative lesions. No pain with calf compression, swelling, warmth, erythema.   Assessment: 69 year old female left foot fifth metatarsal base fracture, nonunion  Plan: -X-rays were obtained and reviewed with the patient.per continues. Radiolucent line consistent with a nonunion however this appears to be a symptomatic at this point.  -Treatment options discussed including all alternatives, risks, and competitions. -At this time discussed with her that although the X-Ray We Can Transition Back into Regular Shoe and See How She Does. If She Has Any Increase in Discomfort She Should Return to the [employer] the Office. -Follow-up in 4-6 weeks or sooner if any problems are to arise. In the meantime I encouraged to call the office with any questions, concerns, changes symptoms. Monitor for any signs or symptoms of DVT/PE to  go to  the ER.  Celesta Gentile, DPM

## 2014-12-23 DIAGNOSIS — L818 Other specified disorders of pigmentation: Secondary | ICD-10-CM | POA: Diagnosis not present

## 2014-12-23 DIAGNOSIS — L309 Dermatitis, unspecified: Secondary | ICD-10-CM | POA: Diagnosis not present

## 2014-12-23 DIAGNOSIS — Z85828 Personal history of other malignant neoplasm of skin: Secondary | ICD-10-CM | POA: Diagnosis not present

## 2015-01-06 ENCOUNTER — Ambulatory Visit (INDEPENDENT_AMBULATORY_CARE_PROVIDER_SITE_OTHER): Payer: Medicare PPO

## 2015-01-06 ENCOUNTER — Encounter: Payer: Self-pay | Admitting: Podiatry

## 2015-01-06 ENCOUNTER — Ambulatory Visit (INDEPENDENT_AMBULATORY_CARE_PROVIDER_SITE_OTHER): Payer: Medicare PPO | Admitting: Podiatry

## 2015-01-06 ENCOUNTER — Ambulatory Visit: Payer: Medicare PPO | Admitting: Podiatry

## 2015-01-06 VITALS — BP 132/75 | HR 70 | Resp 14

## 2015-01-06 DIAGNOSIS — Z9889 Other specified postprocedural states: Secondary | ICD-10-CM

## 2015-01-06 DIAGNOSIS — S92302K Fracture of unspecified metatarsal bone(s), left foot, subsequent encounter for fracture with nonunion: Secondary | ICD-10-CM

## 2015-01-11 NOTE — Progress Notes (Signed)
Patient ID: AMEKA KRIGBAUM, female   DOB: 11-27-1945, 69 y.o.   MRN: [id number]  Subjective: 69 year old female presents the office today follow-up evaluation of left fifth metatarsal base fracture. She states that overall she is doing well and she has been wearing a regular shoe without any difficulty. She gets occasional discomfort to the area but it appears to be resolving. She has continued the bone submitted daily as well. No other complaints at this time in no acute changes.  Objective: AAO 3, NAD Neurovascular status intact and unchanged There is mild discomfort to palpation overlying the left fifth metatarsal base. There is no specific pain to the area. There is no step-offs noted. There is no overlying edema, erythema, increase in warmth. There is no other areas of tenderness to bilateral lower extremity. There is no pain with calf compression, sewing, warmth, erythema. No open lesions or pre-ulcerative lesions.  Assessment: 69 year old female with healing fifth metatarsal base fracture left foot  Plan: -Treatment options discussed including all alternatives, risks, and complications -X-rays were obtained and reviewed with the patient.  -At this time continue supportive shoe gear. -She can continue bone stimulator daily. -Ice to the area. -This any increase in pain to return to the boot and call the office. -Follow-up in 4-6 weeks or sooner if any problems arise. In the meantime, encouraged to call the office with any questions, concerns, change in symptoms.   Celesta Gentile, DPM

## 2015-01-24 DIAGNOSIS — I10 Essential (primary) hypertension: Secondary | ICD-10-CM | POA: Diagnosis not present

## 2015-01-24 DIAGNOSIS — E785 Hyperlipidemia, unspecified: Secondary | ICD-10-CM | POA: Diagnosis not present

## 2015-01-24 DIAGNOSIS — R8299 Other abnormal findings in urine: Secondary | ICD-10-CM | POA: Diagnosis not present

## 2015-01-24 DIAGNOSIS — J029 Acute pharyngitis, unspecified: Secondary | ICD-10-CM | POA: Diagnosis not present

## 2015-02-01 DIAGNOSIS — R9431 Abnormal electrocardiogram [ECG] [EKG]: Secondary | ICD-10-CM | POA: Diagnosis not present

## 2015-02-01 DIAGNOSIS — I872 Venous insufficiency (chronic) (peripheral): Secondary | ICD-10-CM | POA: Diagnosis not present

## 2015-02-01 DIAGNOSIS — R8299 Other abnormal findings in urine: Secondary | ICD-10-CM | POA: Diagnosis not present

## 2015-02-01 DIAGNOSIS — Z0000 Encounter for general adult medical examination without abnormal findings: Secondary | ICD-10-CM | POA: Diagnosis not present

## 2015-02-01 DIAGNOSIS — G2581 Restless legs syndrome: Secondary | ICD-10-CM | POA: Diagnosis not present

## 2015-02-01 DIAGNOSIS — Z23 Encounter for immunization: Secondary | ICD-10-CM | POA: Diagnosis not present

## 2015-02-01 DIAGNOSIS — J029 Acute pharyngitis, unspecified: Secondary | ICD-10-CM | POA: Diagnosis not present

## 2015-02-01 DIAGNOSIS — G43909 Migraine, unspecified, not intractable, without status migrainosus: Secondary | ICD-10-CM | POA: Diagnosis not present

## 2015-02-01 DIAGNOSIS — I10 Essential (primary) hypertension: Secondary | ICD-10-CM | POA: Diagnosis not present

## 2015-02-01 DIAGNOSIS — E785 Hyperlipidemia, unspecified: Secondary | ICD-10-CM | POA: Diagnosis not present

## 2015-02-01 DIAGNOSIS — M1990 Unspecified osteoarthritis, unspecified site: Secondary | ICD-10-CM | POA: Diagnosis not present

## 2015-02-02 DIAGNOSIS — Z1212 Encounter for screening for malignant neoplasm of rectum: Secondary | ICD-10-CM | POA: Diagnosis not present

## 2015-02-08 DIAGNOSIS — H43813 Vitreous degeneration, bilateral: Secondary | ICD-10-CM | POA: Diagnosis not present

## 2015-02-08 DIAGNOSIS — H524 Presbyopia: Secondary | ICD-10-CM | POA: Diagnosis not present

## 2015-02-08 DIAGNOSIS — H53002 Unspecified amblyopia, left eye: Secondary | ICD-10-CM | POA: Diagnosis not present

## 2015-02-08 DIAGNOSIS — Z961 Presence of intraocular lens: Secondary | ICD-10-CM | POA: Diagnosis not present

## 2015-02-27 ENCOUNTER — Other Ambulatory Visit: Payer: Self-pay

## 2015-02-27 DIAGNOSIS — Z1231 Encounter for screening mammogram for malignant neoplasm of breast: Secondary | ICD-10-CM

## 2015-03-31 ENCOUNTER — Ambulatory Visit
Admission: RE | Admit: 2015-03-31 | Discharge: 2015-03-31 | Disposition: A | Discharge status: Home / Self Care | Payer: Medicare PPO | Source: Ambulatory Visit

## 2015-03-31 DIAGNOSIS — Z1231 Encounter for screening mammogram for malignant neoplasm of breast: Secondary | ICD-10-CM | POA: Diagnosis not present

## 2015-04-23 DIAGNOSIS — M8580 Other specified disorders of bone density and structure, unspecified site: Secondary | ICD-10-CM

## 2015-04-23 HISTORY — DX: Other specified disorders of bone density and structure, unspecified site: M85.80

## 2015-04-28 DIAGNOSIS — J45909 Unspecified asthma, uncomplicated: Secondary | ICD-10-CM | POA: Diagnosis not present

## 2015-04-28 DIAGNOSIS — Z6826 Body mass index (BMI) 26.0-26.9, adult: Secondary | ICD-10-CM | POA: Diagnosis not present

## 2015-04-28 DIAGNOSIS — J029 Acute pharyngitis, unspecified: Secondary | ICD-10-CM | POA: Diagnosis not present

## 2015-05-09 ENCOUNTER — Ambulatory Visit (INDEPENDENT_AMBULATORY_CARE_PROVIDER_SITE_OTHER): Payer: Medicare PPO | Admitting: Women's Health

## 2015-05-09 ENCOUNTER — Encounter: Payer: Self-pay | Admitting: Women's Health

## 2015-05-09 VITALS — BP 140/80 | Ht 61.0 in | Wt 145.0 lb

## 2015-05-09 DIAGNOSIS — Z79890 Hormone replacement therapy (postmenopausal): Secondary | ICD-10-CM | POA: Diagnosis not present

## 2015-05-09 DIAGNOSIS — Z13820 Encounter for screening for osteoporosis: Secondary | ICD-10-CM

## 2015-05-09 DIAGNOSIS — Z01419 Encounter for gynecological examination (general) (routine) without abnormal findings: Secondary | ICD-10-CM | POA: Diagnosis not present

## 2015-05-09 DIAGNOSIS — M899 Disorder of bone, unspecified: Secondary | ICD-10-CM

## 2015-05-09 DIAGNOSIS — M8580 Other specified disorders of bone density and structure, unspecified site: Secondary | ICD-10-CM

## 2015-05-09 MED ORDER — ESTRADIOL 0.5 MG PO TABS: 0.5000 mg | ORAL_TABLET | Freq: Every day | ORAL | Status: DC

## 2015-05-09 NOTE — Patient Instructions (Signed)
Osteoporosis Osteoporosis is the thinning and loss of density in the bones. Osteoporosis makes the bones more brittle, fragile, and likely to break (fracture). Over time, osteoporosis can cause the bones to become so weak that they fracture after a simple fall. The bones most likely to fracture are the bones in the hip, wrist, and spine. CAUSES  The exact cause is not known. RISK FACTORS Anyone can develop osteoporosis. You may be at greater risk if you have a family history of the condition or have poor nutrition. You may also have a higher risk if you are:   Female.   50 years old or older.  A smoker.  Not physically active.   White or Asian.  Slender. SIGNS AND SYMPTOMS  A fracture might be the first sign of the disease, especially if it results from a fall or injury that would not usually cause a bone to break. Other signs and symptoms include:   Low back and neck pain.  Stooped posture.  Height loss. DIAGNOSIS  To make a diagnosis, your health care provider may:  Take a medical history.  Perform a physical exam.  Order tests, such as:  A bone mineral density test.  A dual-energy X-ray absorptiometry test. TREATMENT  The goal of osteoporosis treatment is to strengthen your bones to reduce your risk of a fracture. Treatment may involve:  Making lifestyle changes, such as:  Eating a diet rich in calcium.  Doing weight-bearing and muscle-strengthening exercises.  Stopping tobacco use.  Limiting alcohol intake.  Taking medicine to slow the process of bone loss or to increase bone density.  Monitoring your levels of calcium and vitamin D. HOME CARE INSTRUCTIONS  Include calcium and vitamin D in your diet. Calcium is important for bone health, and vitamin D helps the body absorb calcium.  Perform weight-bearing and muscle-strengthening exercises as directed by your health care provider.  Do not use any tobacco products, including cigarettes, chewing  tobacco, and electronic cigarettes. If you need help quitting, ask your health care provider.  Limit your alcohol intake.  Take medicines only as directed by your health care provider.  Keep all follow-up visits as directed by your health care provider. This is important.  Take precautions at home to lower your risk of falling, such as:  Keeping rooms well lit and clutter free.  Installing safety rails on stairs.  Using rubber mats in the bathroom and other areas that are often wet or slippery. SEEK IMMEDIATE MEDICAL CARE IF:  You fall or injure yourself.    This information is not intended to replace advice given to you by your health care provider. Make sure you discuss any questions you have with your health care provider.   Document Released: 01/16/2005 Document Revised: 04/29/2014 Document Reviewed: 09/16/2013 Elsevier Interactive Patient Education 2016 Elsevier [employer]. Menopause is a normal process in which your reproductive ability comes to an end. This process happens gradually over a span of months to years, usually between the ages of 48 and 55. Menopause is complete when you have missed 12 consecutive menstrual periods. It is important to talk with your health care provider about some of the most common conditions that affect postmenopausal women, such as heart disease, cancer, and bone loss (osteoporosis). Adopting a healthy lifestyle and getting preventive care can help to promote your health and wellness. Those actions can also lower your chances of developing some of these common conditions. WHAT SHOULD I KNOW ABOUT MENOPAUSE? During menopause, you may experience a   number of symptoms, such as:  Moderate-to-severe hot flashes.  Night sweats.  Decrease in sex drive.  Mood swings.  Headaches.  Tiredness.  Irritability.  Memory problems.  Insomnia. Choosing to treat or not to treat menopausal changes is an individual decision that you make with your health care  provider. WHAT SHOULD I KNOW ABOUT HORMONE REPLACEMENT THERAPY AND SUPPLEMENTS? Hormone therapy products are effective for treating symptoms that are associated with menopause, such as hot flashes and night sweats. Hormone replacement carries certain risks, especially as you become older. If you are thinking about using estrogen or estrogen with progestin treatments, discuss the benefits and risks with your health care provider. WHAT SHOULD I KNOW ABOUT HEART DISEASE AND STROKE? Heart disease, heart attack, and stroke become more likely as you age. This may be due, in part, to the hormonal changes that your body experiences during menopause. These can affect how your body processes dietary fats, triglycerides, and cholesterol. Heart attack and stroke are both medical emergencies. There are many things that you can do to help prevent heart disease and stroke:  Have your blood pressure checked at least every 1-2 years. High blood pressure causes heart disease and increases the risk of stroke.  If you are 55-79 years old, ask your health care provider if you should take aspirin to prevent a heart attack or a stroke.  Do not use any tobacco products, including cigarettes, chewing tobacco, or electronic cigarettes. If you need help quitting, ask your health care provider.  It is important to eat a healthy diet and maintain a healthy weight.  Be sure to include plenty of vegetables, fruits, low-fat dairy products, and lean protein.  Avoid eating foods that are high in solid fats, added sugars, or salt (sodium).  Get regular exercise. This is one of the most important things that you can do for your health.  Try to exercise for at least 150 minutes each week. The type of exercise that you do should increase your heart rate and make you sweat. This is known as moderate-intensity exercise.  Try to do strengthening exercises at least twice each week. Do these in addition to the moderate-intensity  exercise.  Know your numbers.Ask your health care provider to check your cholesterol and your blood glucose. Continue to have your blood tested as directed by your health care provider. WHAT SHOULD I KNOW ABOUT CANCER SCREENING? There are several types of cancer. Take the following steps to reduce your risk and to catch any cancer development as early as possible. Breast Cancer  Practice breast self-awareness.  This means understanding how your breasts normally appear and feel.  It also means doing regular breast self-exams. Let your health care provider know about any changes, no matter how small.  If you are 40 or older, have a clinician do a breast exam (clinical breast exam or CBE) every year. Depending on your age, family history, and medical history, it may be recommended that you also have a yearly breast X-ray (mammogram).  If you have a family history of breast cancer, talk with your health care provider about genetic screening.  If you are at high risk for breast cancer, talk with your health care provider about having an MRI and a mammogram every year.  Breast cancer (BRCA) gene test is recommended for women who have family members with BRCA-related cancers. Results of the assessment will determine the need for genetic counseling and BRCA1 and for BRCA2 testing. BRCA-related cancers include these types:    Breast. This occurs in males or females.  Ovarian.  Tubal. This may also be called fallopian tube cancer.  Cancer of the abdominal or pelvic lining (peritoneal cancer).  Prostate.  Pancreatic. Cervical, Uterine, and Ovarian Cancer Your health care provider may recommend that you be screened regularly for cancer of the pelvic organs. These include your ovaries, uterus, and vagina. This screening involves a pelvic exam, which includes checking for microscopic changes to the surface of your cervix (Pap test).  For women ages 21-65, health care providers may recommend a  pelvic exam and a Pap test every three years. For women ages 76-65, they may recommend the Pap test and pelvic exam, combined with testing for human papilloma virus (HPV), every five years. Some types of HPV increase your risk of cervical cancer. Testing for HPV may also be done on women of any age who have unclear Pap test results.  Other health care providers may not recommend any screening for nonpregnant women who are considered low risk for pelvic cancer and have no symptoms. Ask your health care provider if a screening pelvic exam is right for you.  If you have had past treatment for cervical cancer or a condition that could lead to cancer, you need Pap tests and screening for cancer for at least 20 years after your treatment. If Pap tests have been discontinued for you, your risk factors (such as having a new sexual partner) need to be reassessed to determine if you should start having screenings again. Some women have medical problems that increase the chance of getting cervical cancer. In these cases, your health care provider may recommend that you have screening and Pap tests more often.  If you have a family history of uterine cancer or ovarian cancer, talk with your health care provider about genetic screening.  If you have vaginal bleeding after reaching menopause, tell your health care provider.  There are currently no reliable tests available to screen for ovarian cancer. Lung Cancer Lung cancer screening is recommended for adults 61-18 years old who are at high risk for lung cancer because of a history of smoking. A yearly low-dose CT scan of the lungs is recommended if you:  Currently smoke.  Have a history of at least 30 pack-years of smoking and you currently smoke or have quit within the past 15 years. A pack-year is smoking an average of one pack of cigarettes per day for one year. Yearly screening should:  Continue until it has been 15 years since you quit.  Stop if you  develop a health problem that would prevent you from having lung cancer treatment. Colorectal Cancer  This type of cancer can be detected and can often be prevented.  Routine colorectal cancer screening usually begins at age 28 and continues through age 29.  If you have risk factors for colon cancer, your health care provider may recommend that you be screened at an earlier age.  If you have a family history of colorectal cancer, talk with your health care provider about genetic screening.  Your health care provider may also recommend using home test kits to check for hidden blood in your stool.  A small camera at the end of a tube can be used to examine your colon directly (sigmoidoscopy or colonoscopy). This is done to check for the earliest forms of colorectal cancer.  Direct examination of the colon should be repeated every 5-10 years until age 9. However, if early forms of precancerous polyps or small  growths are found or if you have a family history or genetic risk for colorectal cancer, you may need to be screened more often. Skin Cancer  Check your skin from head to toe regularly.  Monitor any moles. Be sure to tell your health care provider:  About any new moles or changes in moles, especially if there is a change in a mole's shape or color.  If you have a mole that is larger than the size of a pencil eraser.  If any of your family members has a history of skin cancer, especially at a young age, talk with your health care provider about genetic screening.  Always use sunscreen. Apply sunscreen liberally and repeatedly throughout the day.  Whenever you are outside, protect yourself by wearing long sleeves, pants, a wide-brimmed hat, and sunglasses. WHAT SHOULD I KNOW ABOUT OSTEOPOROSIS? Osteoporosis is a condition in which bone destruction happens more quickly than new bone creation. After menopause, you may be at an increased risk for osteoporosis. To help prevent  osteoporosis or the bone fractures that can happen because of osteoporosis, the following is recommended:  If you are 21-6 years old, get at least 1,000 mg of calcium and at least 600 mg of vitamin D per day.  If you are older than age 32 but younger than age 23, get at least 1,200 mg of calcium and at least 600 mg of vitamin D per day.  If you are older than age 75, get at least 1,200 mg of calcium and at least 800 mg of vitamin D per day. Smoking and excessive alcohol intake increase the risk of osteoporosis. Eat foods that are rich in calcium and vitamin D, and do weight-bearing exercises several times each week as directed by your health care provider. WHAT SHOULD I KNOW ABOUT HOW MENOPAUSE AFFECTS [place]? Depression may occur at any age, but it is more common as you become older. Common symptoms of depression include:  Low or sad mood.  Changes in sleep patterns.  Changes in appetite or eating patterns.  Feeling an overall lack of motivation or enjoyment of activities that you previously enjoyed.  Frequent crying spells. Talk with your health care provider if you think that you are experiencing depression. WHAT SHOULD I KNOW ABOUT IMMUNIZATIONS? It is important that you get and maintain your immunizations. These include:  Tetanus, diphtheria, and pertussis (Tdap) booster vaccine.  Influenza every year before the flu season begins.  Pneumonia vaccine.  Shingles vaccine. Your health care provider may also recommend other immunizations.   This information is not intended to replace advice given to you by your health care provider. Make sure you discuss any questions you have with your health care provider.   Document Released: 05/31/2005 Document Revised: 04/29/2014 Document Reviewed: 12/09/2013 Elsevier Interactive Patient Education [employer].

## 2015-05-09 NOTE — Progress Notes (Signed)
Natalie [name] Jun 26, 1945 [id number]    History:    Presents for breast and pelvic exam. 1982 TAH with BSO for DUB on estradiol. Sexually active. Normal Pap and mammogram history. 10/2012 T score -2.1 FRAX 19%/3.6% declined medication at that time. Negative colonoscopy 2013. Has had Pneumovax not Zostavax. Hypertension and hypercholesterolemia managed by primary care. March 2016 left foot hairline fracture from fall.   Past medical history, past surgical history, family history and social history were all reviewed and documented in the EPIC chart.  ROS:  A ROS was performed and pertinent positives and negatives are included.  Exam:  Filed Vitals:   05/09/15 1140  BP: 140/80    General appearance:  Normal Thyroid:  Symmetrical, normal in size, without palpable masses or nodularity. Respiratory  Auscultation:  Clear without wheezing or rhonchi Cardiovascular  Auscultation:  Regular rate, without rubs, murmurs or gallops  Edema/varicosities:  Not grossly evident Abdominal  Soft,nontender, without masses, guarding or rebound.  Liver/spleen:  No organomegaly noted  Hernia:  None appreciated  Skin  Inspection:  Grossly normal   Breasts: Examined lying and sitting.     Right: Without masses, retractions, discharge or axillary adenopathy.     Left: Without masses, retractions, discharge or axillary adenopathy. No erythema or rash noted Gentitourinary   Inguinal/mons:  Normal without inguinal adenopathy  External genitalia:  Normal  BUS/Urethra/Skene's glands:  Normal  Vagina:  Normal  Cervix:  And uterus absent  Adnexa/parametria:     Rt: Without masses or tenderness.   Lt: Without masses or tenderness.  Anus and perineum: Normal  Digital rectal exam: Normal sphincter tone without palpated masses or tenderness  Assessment/Plan:  70 y.o. MWF G2 P2 for breast and pelvic exam with complaint of left nipple itching.  1982 TAH with BSO on estradiol Osteopenia with elevated  FRAX Left breast areola itching Hypertension/hypercholesterolemia-primary care manages labs and meds  Plan: Estradiol 0.5 will try to decrease to half tablet daily and stopped this year. Reviewed risks of blood clots, strokes and breast cancer. Has had numerous hot flashes when has tried to decrease in the past. SBE's, continue annual 3-D screening mammogram. Continue annual skin check has numerous moles. Encourage Zostavax. Instructed to have a vitamin D level checked with her labs next month at her primary care. Schedule DEXA. Reviewed importance of home safety, fall prevention and importance of weightbearing exercise. Reviewed normal breast exam will apply lotion to topical superficial itching.     YOUNG,NANCY J WHNP, 1:10 PM 05/09/2015

## 2015-05-12 DIAGNOSIS — R3121 Asymptomatic microscopic hematuria: Secondary | ICD-10-CM | POA: Diagnosis not present

## 2015-05-12 DIAGNOSIS — J45909 Unspecified asthma, uncomplicated: Secondary | ICD-10-CM | POA: Diagnosis not present

## 2015-05-12 DIAGNOSIS — M25512 Pain in left shoulder: Secondary | ICD-10-CM | POA: Diagnosis not present

## 2015-05-12 DIAGNOSIS — M859 Disorder of bone density and structure, unspecified: Secondary | ICD-10-CM | POA: Diagnosis not present

## 2015-05-12 DIAGNOSIS — Z6826 Body mass index (BMI) 26.0-26.9, adult: Secondary | ICD-10-CM | POA: Diagnosis not present

## 2015-05-12 DIAGNOSIS — M79605 Pain in left leg: Secondary | ICD-10-CM | POA: Diagnosis not present

## 2015-05-23 ENCOUNTER — Ambulatory Visit (INDEPENDENT_AMBULATORY_CARE_PROVIDER_SITE_OTHER): Payer: Medicare PPO

## 2015-05-23 ENCOUNTER — Encounter: Payer: Self-pay | Admitting: Gynecology

## 2015-05-23 ENCOUNTER — Other Ambulatory Visit: Payer: Self-pay | Admitting: Women's Health

## 2015-05-23 DIAGNOSIS — M899 Disorder of bone, unspecified: Secondary | ICD-10-CM

## 2015-05-23 DIAGNOSIS — M8589 Other specified disorders of bone density and structure, multiple sites: Secondary | ICD-10-CM

## 2015-05-23 DIAGNOSIS — M8580 Other specified disorders of bone density and structure, unspecified site: Secondary | ICD-10-CM

## 2015-05-23 DIAGNOSIS — Z13820 Encounter for screening for osteoporosis: Secondary | ICD-10-CM

## 2015-05-27 DIAGNOSIS — M25552 Pain in left hip: Secondary | ICD-10-CM | POA: Diagnosis not present

## 2015-05-27 DIAGNOSIS — M79605 Pain in left leg: Secondary | ICD-10-CM | POA: Diagnosis not present

## 2015-08-23 DIAGNOSIS — L814 Other melanin hyperpigmentation: Secondary | ICD-10-CM | POA: Diagnosis not present

## 2015-08-23 DIAGNOSIS — C4441 Basal cell carcinoma of skin of scalp and neck: Secondary | ICD-10-CM | POA: Diagnosis not present

## 2015-08-23 DIAGNOSIS — L918 Other hypertrophic disorders of the skin: Secondary | ICD-10-CM | POA: Diagnosis not present

## 2015-08-23 DIAGNOSIS — Z85828 Personal history of other malignant neoplasm of skin: Secondary | ICD-10-CM | POA: Diagnosis not present

## 2015-08-23 DIAGNOSIS — D2272 Melanocytic nevi of left lower limb, including hip: Secondary | ICD-10-CM | POA: Diagnosis not present

## 2015-08-23 DIAGNOSIS — L570 Actinic keratosis: Secondary | ICD-10-CM | POA: Diagnosis not present

## 2015-08-23 DIAGNOSIS — L821 Other seborrheic keratosis: Secondary | ICD-10-CM | POA: Diagnosis not present

## 2015-08-23 DIAGNOSIS — D485 Neoplasm of uncertain behavior of skin: Secondary | ICD-10-CM | POA: Diagnosis not present

## 2015-08-23 DIAGNOSIS — L818 Other specified disorders of pigmentation: Secondary | ICD-10-CM | POA: Diagnosis not present

## 2015-08-23 DIAGNOSIS — D2271 Melanocytic nevi of right lower limb, including hip: Secondary | ICD-10-CM | POA: Diagnosis not present

## 2015-09-20 DIAGNOSIS — Z85828 Personal history of other malignant neoplasm of skin: Secondary | ICD-10-CM | POA: Diagnosis not present

## 2015-09-20 DIAGNOSIS — L820 Inflamed seborrheic keratosis: Secondary | ICD-10-CM | POA: Diagnosis not present

## 2015-09-20 DIAGNOSIS — L818 Other specified disorders of pigmentation: Secondary | ICD-10-CM | POA: Diagnosis not present

## 2015-10-23 ENCOUNTER — Other Ambulatory Visit: Payer: Self-pay | Admitting: Orthopedic Surgery

## 2015-10-23 ENCOUNTER — Ambulatory Visit
Admission: RE | Admit: 2015-10-23 | Discharge: 2015-10-23 | Disposition: A | Discharge status: Home / Self Care | Payer: Medicare PPO | Source: Ambulatory Visit | Attending: Orthopedic Surgery | Admitting: Orthopedic Surgery

## 2015-10-23 DIAGNOSIS — M25512 Pain in left shoulder: Secondary | ICD-10-CM | POA: Diagnosis not present

## 2015-10-23 DIAGNOSIS — K8020 Calculus of gallbladder without cholecystitis without obstruction: Secondary | ICD-10-CM | POA: Diagnosis not present

## 2015-10-23 DIAGNOSIS — R918 Other nonspecific abnormal finding of lung field: Secondary | ICD-10-CM

## 2016-02-05 DIAGNOSIS — E784 Other hyperlipidemia: Secondary | ICD-10-CM | POA: Diagnosis not present

## 2016-02-05 DIAGNOSIS — I10 Essential (primary) hypertension: Secondary | ICD-10-CM | POA: Diagnosis not present

## 2016-02-05 DIAGNOSIS — M859 Disorder of bone density and structure, unspecified: Secondary | ICD-10-CM | POA: Diagnosis not present

## 2016-02-12 DIAGNOSIS — M1990 Unspecified osteoarthritis, unspecified site: Secondary | ICD-10-CM | POA: Diagnosis not present

## 2016-02-12 DIAGNOSIS — R9431 Abnormal electrocardiogram [ECG] [EKG]: Secondary | ICD-10-CM | POA: Diagnosis not present

## 2016-02-12 DIAGNOSIS — G43909 Migraine, unspecified, not intractable, without status migrainosus: Secondary | ICD-10-CM | POA: Diagnosis not present

## 2016-02-12 DIAGNOSIS — N951 Menopausal and female climacteric states: Secondary | ICD-10-CM | POA: Diagnosis not present

## 2016-02-12 DIAGNOSIS — I10 Essential (primary) hypertension: Secondary | ICD-10-CM | POA: Diagnosis not present

## 2016-02-12 DIAGNOSIS — M8580 Other specified disorders of bone density and structure, unspecified site: Secondary | ICD-10-CM | POA: Diagnosis not present

## 2016-02-12 DIAGNOSIS — E784 Other hyperlipidemia: Secondary | ICD-10-CM | POA: Diagnosis not present

## 2016-02-12 DIAGNOSIS — Z0000 Encounter for general adult medical examination without abnormal findings: Secondary | ICD-10-CM | POA: Diagnosis not present

## 2016-02-12 DIAGNOSIS — R3121 Asymptomatic microscopic hematuria: Secondary | ICD-10-CM | POA: Diagnosis not present

## 2016-02-16 DIAGNOSIS — Z85828 Personal history of other malignant neoplasm of skin: Secondary | ICD-10-CM | POA: Diagnosis not present

## 2016-02-16 DIAGNOSIS — L820 Inflamed seborrheic keratosis: Secondary | ICD-10-CM | POA: Diagnosis not present

## 2016-02-16 DIAGNOSIS — L304 Erythema intertrigo: Secondary | ICD-10-CM | POA: Diagnosis not present

## 2016-02-16 DIAGNOSIS — L570 Actinic keratosis: Secondary | ICD-10-CM | POA: Diagnosis not present

## 2016-02-16 DIAGNOSIS — L821 Other seborrheic keratosis: Secondary | ICD-10-CM | POA: Diagnosis not present

## 2016-02-16 DIAGNOSIS — D2272 Melanocytic nevi of left lower limb, including hip: Secondary | ICD-10-CM | POA: Diagnosis not present

## 2016-02-16 DIAGNOSIS — D2271 Melanocytic nevi of right lower limb, including hip: Secondary | ICD-10-CM | POA: Diagnosis not present

## 2016-02-16 DIAGNOSIS — D2239 Melanocytic nevi of other parts of face: Secondary | ICD-10-CM | POA: Diagnosis not present

## 2016-02-16 DIAGNOSIS — D225 Melanocytic nevi of trunk: Secondary | ICD-10-CM | POA: Diagnosis not present

## 2016-02-23 DIAGNOSIS — Z1212 Encounter for screening for malignant neoplasm of rectum: Secondary | ICD-10-CM | POA: Diagnosis not present

## 2016-03-04 ENCOUNTER — Ambulatory Visit (INDEPENDENT_AMBULATORY_CARE_PROVIDER_SITE_OTHER): Payer: Medicare PPO | Admitting: Podiatry

## 2016-03-04 ENCOUNTER — Ambulatory Visit (INDEPENDENT_AMBULATORY_CARE_PROVIDER_SITE_OTHER): Payer: Medicare PPO

## 2016-03-04 ENCOUNTER — Encounter: Payer: Self-pay | Admitting: Podiatry

## 2016-03-04 VITALS — BP 135/74 | HR 74

## 2016-03-04 DIAGNOSIS — M779 Enthesopathy, unspecified: Secondary | ICD-10-CM | POA: Diagnosis not present

## 2016-03-04 DIAGNOSIS — M79671 Pain in right foot: Secondary | ICD-10-CM | POA: Diagnosis not present

## 2016-03-04 DIAGNOSIS — B351 Tinea unguium: Secondary | ICD-10-CM | POA: Diagnosis not present

## 2016-03-04 DIAGNOSIS — L84 Corns and callosities: Secondary | ICD-10-CM

## 2016-03-04 MED ORDER — NONFORMULARY OR COMPOUNDED ITEM: 2 refills | Status: DC

## 2016-03-04 NOTE — Progress Notes (Signed)
Subjective: 70 year old female presents the office they for concerns of pain in the outside aspect of the right foot. This has been ongoing for several months. Has gotten somewhat better but he discontinued. She denies any recent injury or trauma. Denies any swelling or redness. She's had no recent treatment for this. She states the left side her pain is resolved and she had no problems after last appointment. She also is concerned about toenail fungus on her left big toenail. Denies any pain is thick and discolored. Denies any redness or drainage. Also has a callus to both and the balls of her feet on both sides. No redness or injuring swelling. Denies any systemic complaints such as fevers, chills, nausea, vomiting. No acute changes since last appointment, and no other complaints at this time.   Objective: AAO x3, NAD DP/PT pulses palpable bilaterally, CRT less than 3 seconds There is tenderness on the right foot just proximal to the fifth metatarsal base on the distal portion of the peroneal tendon. The tendon appears to be intact. There is trace edema over the area but there is no erythema or increase in warmth. Range of motion intact. There is no area pinpoint bony tenderness is no pain vibratory sensation. There is probably submetatarsal heads plantarly with atrophy of the fat pad. Mild diffuse calluses bilateral submetatarsal area. No underlying ulceration, drainage or and symptoms of infection. Left hallux nail appears to be somewhat dystrophic, discolored, hypertrophic. No tenderness there is no surrounding redness or drainage. No open lesions or pre-ulcerative lesions.  No pain with calf compression, swelling, warmth, erythema  Assessment: Right foot peroneal tendinitis; left hallux onychomycosis; hyperkeratotic lesions  Plan: -All treatment options discussed with the patient including all alternatives, risks, complications.  -X-rays was obtained and reviewed. No evidence of acute  fracture. Discussed steroid injections. She wishes to proceed. Under sterile conditions a makeshift dexamethasone local anesthetic was infiltrated without complications. Post injection care was discussed. Hunter fascial brace was applied from medial to lateral. Ice and elevation. Limited activity. -Hyperkeratotic lesions debrided without complications or bleeding.  -Prescribed compound cream through [facility] for onychomycosis. -Patient encouraged to call the office with any questions, concerns, change in symptoms.   Celesta Gentile, DPM

## 2016-03-05 DIAGNOSIS — M25512 Pain in left shoulder: Secondary | ICD-10-CM | POA: Diagnosis not present

## 2016-03-19 DIAGNOSIS — I10 Essential (primary) hypertension: Secondary | ICD-10-CM | POA: Diagnosis not present

## 2016-03-19 DIAGNOSIS — Z6828 Body mass index (BMI) 28.0-28.9, adult: Secondary | ICD-10-CM | POA: Diagnosis not present

## 2016-03-22 DIAGNOSIS — M25512 Pain in left shoulder: Secondary | ICD-10-CM | POA: Diagnosis not present

## 2016-03-25 ENCOUNTER — Ambulatory Visit (INDEPENDENT_AMBULATORY_CARE_PROVIDER_SITE_OTHER): Payer: Medicare PPO

## 2016-03-25 ENCOUNTER — Ambulatory Visit (INDEPENDENT_AMBULATORY_CARE_PROVIDER_SITE_OTHER): Payer: Medicare PPO | Admitting: Podiatry

## 2016-03-25 DIAGNOSIS — M779 Enthesopathy, unspecified: Secondary | ICD-10-CM

## 2016-03-25 DIAGNOSIS — L84 Corns and callosities: Secondary | ICD-10-CM

## 2016-03-25 DIAGNOSIS — M79671 Pain in right foot: Secondary | ICD-10-CM

## 2016-03-25 DIAGNOSIS — M75102 Unspecified rotator cuff tear or rupture of left shoulder, not specified as traumatic: Secondary | ICD-10-CM | POA: Diagnosis not present

## 2016-03-25 DIAGNOSIS — M25512 Pain in left shoulder: Secondary | ICD-10-CM | POA: Diagnosis not present

## 2016-03-25 DIAGNOSIS — R52 Pain, unspecified: Secondary | ICD-10-CM

## 2016-03-29 ENCOUNTER — Telehealth: Payer: Self-pay | Admitting: *Deleted

## 2016-03-29 MED ORDER — NONFORMULARY OR COMPOUNDED ITEM: 2 refills | Status: DC

## 2016-03-29 NOTE — Telephone Encounter (Signed)
Pt states she has not gotten the rx from [facility]. I reviewed 03/25/2016 LOV and Dr. Jacqualyn Posey had ordered Antiiflammatory cream from [facility]. Orders faxed to [facility].

## 2016-03-29 NOTE — Progress Notes (Signed)
Subjective: 70 year old female presents the office they for follow-up evaluation of right foot pain. She said that her foot still hurts that she feels that there is a broken bone on the side of her foot. She denies any recent injury or falls or any trauma. She denies any swelling or redness or warmth. She does continue calluses to the balls of her feet. She states the injection to the side of her right foot did not help her last appointment. She's been wearing the plantar fascial brace this is not helping either. Denies any systemic complaints such as fevers, chills, nausea, vomiting. No acute changes since last appointment, and no other complaints at this time.   Objective: AAO x3, NAD DP/PT pulses palpable bilaterally, CRT less than 3 seconds There is continued tenderness palpation of the right foot on the fifth metatarsal base and just proximal to this area on the course of the peroneal tendon. There is no overlying edema, erythema, increase in warmth. The peroneal tendon appears to be intact. There is no pain vibratory sensation. There is no other areas of tenderness to bilateral lower extremities. Hyperkeratotic lesion submetatarsal area bilaterally 2. Upon debridement no underlying ulceration, drainage or any signs of infection. No open lesions or pre-ulcerative lesions.  No pain with calf compression, swelling, warmth, erythema  Assessment: Peroneal tendinitis, hyperkeratotic lesions  Plan: -All treatment options discussed with the patient including all alternatives, risks, complications.  -At this time as she is continued have pain I recommended her to return into her candidate surety has. She brought this with her today as well. Repeat x-ray states did not reveal any evidence of acute fracture. Continue ice and elevation. I ordered a compound cream to help for anti-inflammatory.  -Hyperkeratotic lesions were debrided 2 without complications or bleeding at her request. -Discussed possible  MRI if symptoms continue. -Patient encouraged to call the office with any questions, concerns, change in symptoms.   Celesta Gentile, DPM

## 2016-04-19 ENCOUNTER — Ambulatory Visit (INDEPENDENT_AMBULATORY_CARE_PROVIDER_SITE_OTHER): Payer: Medicare PPO | Admitting: Podiatry

## 2016-04-19 ENCOUNTER — Encounter: Payer: Self-pay | Admitting: Podiatry

## 2016-04-19 DIAGNOSIS — M779 Enthesopathy, unspecified: Secondary | ICD-10-CM

## 2016-04-19 DIAGNOSIS — Q828 Other specified congenital malformations of skin: Secondary | ICD-10-CM

## 2016-04-19 NOTE — Progress Notes (Signed)
Subjective: 70 year old female presents the office they for follow-up evaluation of right foot pain. She states that the pain to the outside of her foot has resolved and she is no longer having any pain to the outside part of her foot. Her only. She is having now states the calluses to both of her feet. She denies any swelling or redness or any drainage.Denies any systemic complaints such as fevers, chills, nausea, vomiting. No acute changes since last appointment, and no other complaints at this time.   Objective: AAO x3, NAD DP/PT pulses palpable bilaterally, CRT less than 3 seconds There is at this time no tenderness palpation of the right foot on the fifth metatarsal base or proximal to this area on the course of the peroneal tendon. There is no overlying edema, erythema, increase in warmth. The peroneal tendon appears to be intact. There is no pain vibratory sensation. There is no other areas of tenderness to bilateral lower extremities. Hyperkeratotic lesion submetatarsal area bilaterally 2. Upon debridement no underlying ulceration, drainage or any signs of infection. No open lesions or pre-ulcerative lesions.  No pain with calf compression, swelling, warmth, erythema  Assessment: Peroneal tendinitis, hyperkeratotic lesions  Plan: -All treatment options discussed with the patient including all alternatives, risks, complications.  -Tendinitis on the right foot appears to be healed. Continues with for any recurrence. Discussed with her shoe gear modifications orthotics to help prevent any recurrence. She is currently on prednisone for her shoulder and this is likely helping her foot as well. -Hyperkeratotic lesions debrided 2 without any complications or bleeding. -Follow-up as needed. Call any questions concerns meantime.  Celesta Gentile, DP

## 2016-04-24 DIAGNOSIS — B029 Zoster without complications: Secondary | ICD-10-CM | POA: Diagnosis not present

## 2016-04-24 DIAGNOSIS — Z85828 Personal history of other malignant neoplasm of skin: Secondary | ICD-10-CM | POA: Diagnosis not present

## 2016-04-24 DIAGNOSIS — I10 Essential (primary) hypertension: Secondary | ICD-10-CM | POA: Diagnosis not present

## 2016-04-29 DIAGNOSIS — M25512 Pain in left shoulder: Secondary | ICD-10-CM | POA: Diagnosis not present

## 2016-04-29 DIAGNOSIS — Z6827 Body mass index (BMI) 27.0-27.9, adult: Secondary | ICD-10-CM | POA: Diagnosis not present

## 2016-04-29 DIAGNOSIS — B349 Viral infection, unspecified: Secondary | ICD-10-CM | POA: Diagnosis not present

## 2016-04-29 DIAGNOSIS — J029 Acute pharyngitis, unspecified: Secondary | ICD-10-CM | POA: Diagnosis not present

## 2016-04-29 DIAGNOSIS — J0100 Acute maxillary sinusitis, unspecified: Secondary | ICD-10-CM | POA: Diagnosis not present

## 2016-04-29 DIAGNOSIS — B029 Zoster without complications: Secondary | ICD-10-CM | POA: Diagnosis not present

## 2016-04-29 DIAGNOSIS — M1990 Unspecified osteoarthritis, unspecified site: Secondary | ICD-10-CM | POA: Diagnosis not present

## 2016-05-09 ENCOUNTER — Other Ambulatory Visit: Payer: Self-pay | Admitting: Women's Health

## 2016-05-09 DIAGNOSIS — Z79890 Hormone replacement therapy (postmenopausal): Secondary | ICD-10-CM

## 2016-05-10 ENCOUNTER — Encounter: Payer: Medicare PPO | Admitting: Women's Health

## 2016-05-15 DIAGNOSIS — Z6828 Body mass index (BMI) 28.0-28.9, adult: Secondary | ICD-10-CM | POA: Diagnosis not present

## 2016-05-15 DIAGNOSIS — E784 Other hyperlipidemia: Secondary | ICD-10-CM | POA: Diagnosis not present

## 2016-05-15 DIAGNOSIS — K219 Gastro-esophageal reflux disease without esophagitis: Secondary | ICD-10-CM | POA: Diagnosis not present

## 2016-05-15 DIAGNOSIS — M25512 Pain in left shoulder: Secondary | ICD-10-CM | POA: Diagnosis not present

## 2016-05-15 DIAGNOSIS — J309 Allergic rhinitis, unspecified: Secondary | ICD-10-CM | POA: Diagnosis not present

## 2016-05-15 DIAGNOSIS — J0100 Acute maxillary sinusitis, unspecified: Secondary | ICD-10-CM | POA: Diagnosis not present

## 2016-05-15 DIAGNOSIS — B029 Zoster without complications: Secondary | ICD-10-CM | POA: Diagnosis not present

## 2016-06-10 DIAGNOSIS — J309 Allergic rhinitis, unspecified: Secondary | ICD-10-CM | POA: Diagnosis not present

## 2016-06-10 DIAGNOSIS — R05 Cough: Secondary | ICD-10-CM | POA: Diagnosis not present

## 2016-06-10 DIAGNOSIS — I10 Essential (primary) hypertension: Secondary | ICD-10-CM | POA: Diagnosis not present

## 2016-06-10 DIAGNOSIS — K219 Gastro-esophageal reflux disease without esophagitis: Secondary | ICD-10-CM | POA: Diagnosis not present

## 2016-06-10 DIAGNOSIS — Z6828 Body mass index (BMI) 28.0-28.9, adult: Secondary | ICD-10-CM | POA: Diagnosis not present

## 2016-06-11 ENCOUNTER — Encounter: Payer: Self-pay | Admitting: Women's Health

## 2016-06-11 ENCOUNTER — Ambulatory Visit (INDEPENDENT_AMBULATORY_CARE_PROVIDER_SITE_OTHER): Payer: Medicare PPO | Admitting: Women's Health

## 2016-06-11 VITALS — BP 124/80 | Ht 62.0 in | Wt 152.8 lb

## 2016-06-11 DIAGNOSIS — Z79890 Hormone replacement therapy (postmenopausal): Secondary | ICD-10-CM

## 2016-06-11 DIAGNOSIS — R21 Rash and other nonspecific skin eruption: Secondary | ICD-10-CM

## 2016-06-11 MED ORDER — NYSTATIN 100000 UNIT/GM EX POWD: Freq: Four times a day (QID) | CUTANEOUS | 0 refills | Status: DC

## 2016-06-11 MED ORDER — ESTRADIOL 0.5 MG PO TABS: 0.5000 mg | ORAL_TABLET | Freq: Every day | ORAL | 4 refills | Status: DC

## 2016-06-11 NOTE — Patient Instructions (Signed)

## 2016-06-11 NOTE — Progress Notes (Signed)
Natalie [name] 08-01-1945 [id number]    History:    Presents for breast and pelvic exam. 1982 TAH with BSO for DUB, on HRT. Taking half tablet of estradiol 0.5 daily with good relief of hot flushes states cannot tolerate no HRT due to hot flushes causing poor sleep. Normal Pap and mammogram history. Primary care managing hypercholesterolemia, hypertension. 2014 osteopenia T score -2.1 declines medication declines further screening. Has had both the Pneumovax and Zostavax. 2013 2 benign colon polyps has 5 year follow-up. Scheduled for shoulder surgery this month.  Past medical history, past surgical history, family history and social history were all reviewed and documented in the EPIC chart.   ROS:  A ROS was performed and pertinent positives and negatives are included.  Exam:  Vitals:   06/11/16 1144  BP: 124/80  Weight: 152 lb 12.8 oz (69.3 kg)  Height: 5\' 2"  (1.575 m)   Body mass index is 27.95 kg/m.   General appearance:  Normal Thyroid:  Symmetrical, normal in size, without palpable masses or nodularity. Respiratory  Auscultation:  Clear without wheezing or rhonchi Cardiovascular  Auscultation:  Regular rate, without rubs, murmurs or gallops  Edema/varicosities:  Not grossly evident Abdominal  Soft,nontender, without masses, guarding or rebound.  Liver/spleen:  No organomegaly noted  Hernia:  None appreciated  Skin  Inspection:  Grossly normal   Breasts: Examined lying and sitting.     Right: Without masses, retractions, discharge or axillary adenopathy.     Left: Without masses, retractions, discharge or axillary adenopathy. Gentitourinary   Inguinal/mons:  Normal without inguinal adenopathy  External genitalia:  Normal  BUS/Urethra/Skene's glands:  Normal  Vagina:  Atrophic Cervix:  And uterus absent  Adnexa/parametria:     Rt: Without masses or tenderness.   Lt: Without masses or tenderness.  Anus and perineum: Normal  Digital rectal exam: Normal sphincter tone  without palpated masses or tenderness  Assessment/Plan:  71 y.o. M WF G4 P2 for breast and pelvic exam.  1982 TAH and BSO on estradiol Hypertension/hypercholesterolemia-primary care manages labs and meds Osteopenia declines treatment Shoulder has scheduled follow-up surgery.  Plan: SBE's, continue annual 3-D screening mammogram, calcium rich diet, vitamin D 2000 daily encouraged. Home safety, fall prevention and importance of weightbearing exercise reviewed. States plans to get back to regular exercise after surgery. HRT risks of blood clots, strokes and breast cancer reviewed will continue taking half tablet of estradiol 0.5 daily. Prescription given.    Huel Cote WHNP, 1:28 PM 06/11/2016

## 2016-06-12 ENCOUNTER — Other Ambulatory Visit (HOSPITAL_COMMUNITY): Payer: Self-pay | Admitting: Respiratory Therapy

## 2016-06-12 DIAGNOSIS — R059 Cough, unspecified: Secondary | ICD-10-CM

## 2016-06-12 DIAGNOSIS — R05 Cough: Secondary | ICD-10-CM

## 2016-06-21 ENCOUNTER — Ambulatory Visit (HOSPITAL_COMMUNITY)
Admission: RE | Admit: 2016-06-21 | Discharge: 2016-06-21 | Disposition: A | Discharge status: Home / Self Care | Payer: Medicare PPO | Source: Ambulatory Visit | Attending: Internal Medicine | Admitting: Internal Medicine

## 2016-06-21 DIAGNOSIS — R942 Abnormal results of pulmonary function studies: Secondary | ICD-10-CM | POA: Diagnosis not present

## 2016-06-21 DIAGNOSIS — R05 Cough: Secondary | ICD-10-CM | POA: Insufficient documentation

## 2016-06-21 LAB — PULMONARY FUNCTION TEST
DL/VA % PRED: 92 %
DL/VA: 4.21 ml/min/mmHg/L
DLCO UNC % PRED: 62 %
DLCO unc: 13.56 ml/min/mmHg
FEF 25-75 PRE: 1.73 L/s
FEF 25-75 Post: 2.65 L/sec
FEF2575-%CHANGE-POST: 53 %
FEF2575-%PRED-POST: 158 %
FEF2575-%PRED-PRE: 103 %
FEV1-%CHANGE-POST: 8 %
FEV1-%Pred-Post: 92 %
FEV1-%Pred-Pre: 84 %
FEV1-PRE: 1.7 L
FEV1-Post: 1.84 L
FEV1FVC-%CHANGE-POST: 4 %
FEV1FVC-%PRED-PRE: 107 %
FEV6-%CHANGE-POST: 3 %
FEV6-%PRED-POST: 85 %
FEV6-%Pred-Pre: 82 %
FEV6-PRE: 2.09 L
FEV6-Post: 2.17 L
FEV6FVC-%Pred-Post: 105 %
FEV6FVC-%Pred-Pre: 105 %
FVC-%CHANGE-POST: 3 %
FVC-%PRED-POST: 81 %
FVC-%Pred-Pre: 78 %
FVC-Post: 2.17 L
FVC-Pre: 2.09 L
POST FEV1/FVC RATIO: 85 %
Post FEV6/FVC ratio: 100 %
Pre FEV1/FVC ratio: 81 %
Pre FEV6/FVC Ratio: 100 %
RV % pred: 71 %
RV: 1.52 L
TLC % pred: 78 %
TLC: 3.74 L

## 2016-06-21 MED ORDER — ALBUTEROL SULFATE (2.5 MG/3ML) 0.083% IN NEBU
2.5000 mg | INHALATION_SOLUTION | Freq: Once | RESPIRATORY_TRACT | Status: AC
  Administered 2016-06-21: 2.5 mg via RESPIRATORY_TRACT

## 2016-06-27 ENCOUNTER — Institutional Professional Consult (permissible substitution): Payer: Medicare PPO | Admitting: Pulmonary Disease

## 2016-07-02 DIAGNOSIS — H8001 Otosclerosis involving oval window, nonobliterative, right ear: Secondary | ICD-10-CM | POA: Diagnosis not present

## 2016-07-02 DIAGNOSIS — H9312 Tinnitus, left ear: Secondary | ICD-10-CM | POA: Diagnosis not present

## 2016-07-02 DIAGNOSIS — H906 Mixed conductive and sensorineural hearing loss, bilateral: Secondary | ICD-10-CM | POA: Diagnosis not present

## 2016-07-23 ENCOUNTER — Encounter: Payer: Self-pay | Admitting: Pulmonary Disease

## 2016-07-23 ENCOUNTER — Ambulatory Visit (INDEPENDENT_AMBULATORY_CARE_PROVIDER_SITE_OTHER): Payer: Medicare PPO | Admitting: Pulmonary Disease

## 2016-07-23 VITALS — BP 132/72 | HR 72 | Ht 62.0 in | Wt 155.6 lb

## 2016-07-23 DIAGNOSIS — R059 Cough, unspecified: Secondary | ICD-10-CM

## 2016-07-23 DIAGNOSIS — R05 Cough: Secondary | ICD-10-CM

## 2016-07-23 LAB — NITRIC OXIDE: NITRIC OXIDE: 28

## 2016-07-23 MED ORDER — CHLORPHENIRAMINE MALEATE 4 MG PO TABS: 8.0000 mg | ORAL_TABLET | Freq: Three times a day (TID) | ORAL | 1 refills | Status: DC

## 2016-07-23 MED ORDER — AZELASTINE HCL 0.1 % NA SOLN: 2.0000 | Freq: Two times a day (BID) | NASAL | 2 refills | Status: DC

## 2016-07-23 MED ORDER — OMEPRAZOLE 20 MG PO CPDR: 20.0000 mg | DELAYED_RELEASE_CAPSULE | Freq: Two times a day (BID) | ORAL | 11 refills | Status: DC

## 2016-07-23 NOTE — Patient Instructions (Signed)
We will start on chlorpheniramine 8 mg 3 times daily and Astelin nasal spray twice a day Continue using the Flonase. Stop taking the Zyrtec Will give you an albuterol rescue inhaler We will increase the prilosec to 20 mg twice daily.  Return in 1-2 months

## 2016-07-23 NOTE — Progress Notes (Signed)
Natalie [name]    [id number]    January 30, 1946  Primary Care 21 R, MD  Referring Physician: Prince Solian, MD [address] [place], [place] [address]  Chief complaint:  Consult for evaluation of cough, ? Pulmonary fibrosis  HPI: Natalie [name] is a 71 year old with past history of arthritis, hyperlipidemia, hypertension. She's had an episode of viral illness and January, tested negative for flu. His symptoms at that time consisted of cough with productive white mucus, dyspnea, wheezing. She feels that the mucus production and dyspnea is improved but she continues to have chronic cough, nonproductive in nature with occasional wheezing. She has had a couple of chest x-rays at Dr. Bari Mantis office which was reportedly negative. I don't have the images to review. She has been tried on multiple prednisone tapers which did not help with the symptoms. She is also on Prilosec for GERD. She has issues with allergies, postnasal drip. She saw Dr. Cresenciano Lick ENT and was given Zyrtec and Flonase. She has history of seasonal allergies and episodic reactive airway disease   Outpatient Encounter Prescriptions as of 07/23/2016  Medication Sig  . aspirin 81 MG tablet Take 81 mg by mouth daily.    Marland Kitchen aspirin-acetaminophen-caffeine (EXCEDRIN MIGRAINE) 250-250-65 MG per tablet Take 1 tablet by mouth every 6 (six) hours as needed.  Marland Kitchen atenolol-chlorthalidone (TENORETIC) 100-25 MG per tablet Take 0.5 tablets by mouth daily.    Marland Kitchen atorvastatin (LIPITOR) 20 MG tablet Take 20 mg by mouth daily.    . cetirizine (ZYRTEC) 10 MG tablet Take 10 mg by mouth daily.  . Cholecalciferol (VITAMIN D) 2000 UNITS tablet Take 2,000 Units by mouth daily.    Marland Kitchen estradiol (ESTRACE) 0.5 MG tablet Take 1 tablet (0.5 mg total) by mouth daily.  . meloxicam (MOBIC) 15 MG tablet as directed.  . NONFORMULARY OR COMPOUNDED ITEM [facility]:  Anti-inflammatory cream - Diclofenac 3%, Baclofen 2%, Lidocaine 2%, apply 1-2  grams to affected area 3-4 times daily  . nystatin (MYCOSTATIN/NYSTOP) powder Apply topically 4 (four) times daily.  . [DISCONTINUED] prednisoLONE 5 MG TABS tablet Take by mouth.  . [DISCONTINUED] ranitidine (ZANTAC) 150 MG tablet Take 150 mg by mouth daily.    Marland Kitchen omeprazole (PRILOSEC) 20 MG capsule    No facility-administered encounter medications on file as of 07/23/2016.     Allergies as of 07/23/2016 - Review Complete 07/23/2016  Allergen Reaction Noted  . Cephalexin  05/28/2011  . Nitrofurantoin monohyd macro  05/28/2011  . Sulfa antibiotics  11/29/2010    Past Medical History:  Diagnosis Date  . Arthritis   . Back pain   . Change in hearing   . Generalized headaches   . Heart murmur   . Hyperlipidemia   . Hypertension   . Joint pain   . Leg pain   . Osteopenia 04/2015   T score -1.9 FRAX 12%/2.6% stable from prior DEXA  . Varicose veins of leg with pain Left  > Right    Past Surgical History:  Procedure Laterality Date  . ABDOMINAL HYSTERECTOMY  1982   TAH BSO  . arm surgery  1998 right arm  . CESAREAN SECTION    . ECTOPIC PREGNANCY SURGERY    . ENDOVENOUS ABLATION SAPHENOUS VEIN W/ LASER  left leg 10 years ago per pt.  . EXPLORATORY TYMPANOTOMY    . FINGER SURGERY  2007  right hand 4th and 5th digits  . STAPEDECTOMY  2001  . STAPEDECTOMY  revision  2002  . [place]  . VEIN LIGATION AND STRIPPING  9-12   LEFT- RIGHT LEG INJECTED FOR SPIDER VEINS    Family History  Problem Relation Age of Onset  . Heart disease Mother   . Hypertension Mother   . Heart disease Father   . Hypertension Father     Social History   Social History  . Marital status: Married    Spouse name: N/A  . Number of children: N/A  . Years of education: N/A   Occupational History  . Not on file.   Social History Main Topics  . Smoking status: Never Smoker  . Smokeless tobacco: Never Used  . Alcohol use No  . Drug use: No  . Sexual activity: Yes    Birth control/  protection: None   Other Topics Concern  . Not on file   Social History Narrative  . No narrative on file    Review of systems: Review of Systems  Constitutional: Negative for fever and chills.  HENT: Negative.   Eyes: Negative for blurred vision.  Respiratory: as per HPI  Cardiovascular: Negative for chest pain and palpitations.  Gastrointestinal: Negative for vomiting, diarrhea, blood per rectum. Genitourinary: Negative for dysuria, urgency, frequency and hematuria.  Musculoskeletal: Negative for myalgias, back pain and joint pain.  Skin: Negative for itching and rash.  Neurological: Negative for dizziness, tremors, focal weakness, seizures and loss of consciousness.  Endo/Heme/Allergies: Negative for environmental allergies.  Psychiatric/Behavioral: Negative for depression, suicidal ideas and hallucinations.  All other systems reviewed and are negative.  Physical Exam: Blood pressure 132/72, pulse 72, height 5\' 2"  (1.575 m), weight 155 lb 9.6 oz (70.6 kg), SpO2 98 %. Gen:      No acute distress HEENT:  EOMI, sclera anicteric Neck:     No masses; no thyromegaly  Lungs:    Clear to auscultation bilaterally; normal respiratory effort CV:         Regular rate and rhythm; no murmurs Abd:      + bowel sounds; soft, non-tender; no palpable masses, no distension Ext:    No edema; adequate peripheral perfusion Skin:      Warm and dry; no rash Neuro: alert and oriented x 3 Psych: normal mood and affect  Data Reviewed: Chest x-ray 10/23/15- no active pulmonary process, multiple gallstones All images reviewed.  PFTs 06/21/16 FVC 2.17 [81%) FEV1 1.84 [92%) F/F 85 TLC 78% DLCO 62%, DLCO/VA 92% Minimal restriction, moderate diffusion defect which corrects for alveolar volume.  FENO 07/23/16- 28  Assessment:  Evaluation for chronic cough Her symptoms are likely postinflammatory from an episode of viral bronchitis in January. She has upper airway cough syndrome exacerbated by  postnasal drip and GERD. She has slightly elevated FENO and PFTs which showed reduction in mid flow rates suggestive of small and reactive airways disease.  She will need to be treated aggressively for factors that are prolonging the resolution of cough. We will treat the postnasal drip by using chlorpheniramine 3 times daily and Astelin nasal spray. She'll continue on the Flonase. We will increase the Prilosec to 20 mg twice a day. She will be given an albuterol inhaler to be used as needed. These measures can be scaled back when cough improves I educated her on behavioral changes to deal with cough including conscious suppression of the urge to cough, use of throat lozenges, limiting talking.   Concern for fibrosis Her PFTs shows minimal restriction and DLCO that corrects for alveolar volume. I  suspect abnormalities are secondary to her body habitus. There is no evidence of interstitial lung disease or fibrosis on prior chest imaging. She's had recent chest x-rays in January and I'll try to obtain these reports for review  Plan/Recommendations: - Chlorpheniramine 8 mg tid, astelin, flonase nasal sprays - Prilosec 20 mg bid - Albuterol inhaler.  Marshell Garfinkel MD [facility] Pager [phone] 07/23/2016, 2:24 PM  CC: Prince Solian, MD

## 2016-09-18 ENCOUNTER — Encounter: Payer: Self-pay | Admitting: Pulmonary Disease

## 2016-09-18 ENCOUNTER — Telehealth: Payer: Self-pay

## 2016-09-18 ENCOUNTER — Ambulatory Visit (INDEPENDENT_AMBULATORY_CARE_PROVIDER_SITE_OTHER): Payer: Medicare PPO | Admitting: Pulmonary Disease

## 2016-09-18 VITALS — BP 122/70 | HR 66 | Ht 62.0 in | Wt 151.0 lb

## 2016-09-18 DIAGNOSIS — R05 Cough: Secondary | ICD-10-CM

## 2016-09-18 DIAGNOSIS — R059 Cough, unspecified: Secondary | ICD-10-CM

## 2016-09-18 NOTE — Patient Instructions (Signed)
Continue using the chlorpheniramine and Flonase nasal spray as needed I don't believe you need to be on regular inhalers From our perspective it okay to go ahead with the shoulder surgery I'll see her in 6 months. Please call us sooner if there is any worsening of symptoms.

## 2016-09-18 NOTE — Telephone Encounter (Signed)
lmtcb X1 for Dr. Dagmar Hait requesting CXR records to be faxed. Will route to my basket to f/u on.

## 2016-09-18 NOTE — Progress Notes (Signed)
Natalie [name]    [id number]    21-Jul-1945  Primary Care Physician:Avva, Steva Ready, MD  Referring Physician: Prince Solian, [address] [place], [place] [address]  Chief complaint:  Follow up for cough,.  HPI: Natalie [name] is a 71 year old with past history of arthritis, hyperlipidemia, hypertension. She's had an episode of viral illness and January, tested negative for flu. His symptoms at that time consisted of cough with productive white mucus, dyspnea, wheezing. She feels that the mucus production and dyspnea is improved but she continues to have chronic cough, nonproductive in nature with occasional wheezing. She has had a couple of chest x-rays at Dr. Bari Mantis office which was reportedly negative. I don't have the images to review. She has been tried on multiple prednisone tapers which did not help with the symptoms. She is also on Prilosec for GERD. She has issues with allergies, postnasal drip. She saw Dr. Cresenciano Lick ENT and was given Zyrtec and Flonase. She has history of seasonal allergies and episodic reactive airway disease.  Interim History: Her cough is improved. She still has some sinus drainage. Denies any fevers, wheezing, dyspnea, sputum production, hemoptysis. She is under some stress as her husband underwent emergent CABG.  Outpatient Encounter Prescriptions as of 09/18/2016  Medication Sig  . aspirin 81 MG tablet Take 81 mg by mouth daily.    Marland Kitchen aspirin-acetaminophen-caffeine (EXCEDRIN MIGRAINE) 250-250-65 MG per tablet Take 1 tablet by mouth every 6 (six) hours as needed.  Marland Kitchen atenolol-chlorthalidone (TENORETIC) 100-25 MG per tablet Take 0.5 tablets by mouth daily.    Marland Kitchen atorvastatin (LIPITOR) 20 MG tablet Take 20 mg by mouth daily.    . chlorpheniramine (CHLOR-TRIMETON) 4 MG tablet Take 2 tablets (8 mg total) by mouth 3 (three) times daily.  . Cholecalciferol (VITAMIN D) 2000 UNITS tablet Take 2,000 Units by mouth daily.    Marland Kitchen estradiol (ESTRACE) 0.5 MG tablet  Take 1 tablet (0.5 mg total) by mouth daily.  . meloxicam (MOBIC) 15 MG tablet as directed.  . NONFORMULARY OR COMPOUNDED ITEM [facility]:  Anti-inflammatory cream - Diclofenac 3%, Baclofen 2%, Lidocaine 2%, apply 1-2 grams to affected area 3-4 times daily  . nystatin (MYCOSTATIN/NYSTOP) powder Apply topically 4 (four) times daily.  Marland Kitchen omeprazole (PRILOSEC) 20 MG capsule   . omeprazole (PRILOSEC) 20 MG capsule Take 1 capsule (20 mg total) by mouth 2 (two) times daily.  . [DISCONTINUED] azelastine (ASTELIN) 0.1 % nasal spray Place 2 sprays into both nostrils 2 (two) times daily. Use in each nostril as directed  . [DISCONTINUED] cetirizine (ZYRTEC) 10 MG tablet Take 10 mg by mouth daily.   No facility-administered encounter medications on file as of 09/18/2016.     Allergies as of 09/18/2016 - Review Complete 09/18/2016  Allergen Reaction Noted  . Cephalexin  05/28/2011  . Nitrofurantoin monohyd macro  05/28/2011  . Sulfa antibiotics  11/29/2010    Past Medical History:  Diagnosis Date  . Arthritis   . Back pain   . Change in hearing   . Generalized headaches   . Heart murmur   . Hyperlipidemia   . Hypertension   . Joint pain   . Leg pain   . Osteopenia 04/2015   T score -1.9 FRAX 12%/2.6% stable from prior DEXA  . Varicose veins of leg with pain Left  > Right    Past Surgical History:  Procedure Laterality Date  . ABDOMINAL HYSTERECTOMY  1982   TAH BSO  .  arm surgery  1998 right arm  . CESAREAN SECTION    . ECTOPIC PREGNANCY SURGERY    . ENDOVENOUS ABLATION SAPHENOUS VEIN W/ LASER  left leg 10 years ago per pt.  . EXPLORATORY TYMPANOTOMY    . FINGER SURGERY  2007  right hand 4th and 5th digits  . STAPEDECTOMY  2001  . STAPEDECTOMY  revision 2002  . TONSILLECTOMY  1954  . VEIN LIGATION AND STRIPPING  9-12   LEFT- RIGHT LEG INJECTED FOR SPIDER VEINS    Family History  Problem Relation Age of Onset  . Heart disease Mother   . Hypertension Mother   . Heart  disease Father   . Hypertension Father     Social History   Social History  . Marital status: Married    Spouse name: N/A  . Number of children: N/A  . Years of education: N/A   Occupational History  . Not on file.   Social History Main Topics  . Smoking status: Never Smoker  . Smokeless tobacco: Never Used  . Alcohol use No  . Drug use: No  . Sexual activity: Yes    Birth control/ protection: None   Other Topics Concern  . Not on file   Social History Narrative  . No narrative on file    Review of systems: Review of Systems  Constitutional: Negative for fever and chills.  HENT: Negative.   Eyes: Negative for blurred vision.  Respiratory: as per HPI  Cardiovascular: Negative for chest pain and palpitations.  Gastrointestinal: Negative for vomiting, diarrhea, blood per rectum. Genitourinary: Negative for dysuria, urgency, frequency and hematuria.  Musculoskeletal: Negative for myalgias, back pain and joint pain.  Skin: Negative for itching and rash.  Neurological: Negative for dizziness, tremors, focal weakness, seizures and loss of consciousness.  Endo/Heme/Allergies: Negative for environmental allergies.  Psychiatric/Behavioral: Negative for depression, suicidal ideas and hallucinations.  All other systems reviewed and are negative.  Physical Exam: Blood pressure 122/70, pulse 66, height 5\' 2"  (1.575 m), weight 151 lb (68.5 kg), SpO2 95 %. Gen:      No acute distress HEENT:  EOMI, sclera anicteric Neck:     No masses; no thyromegaly Lungs:    Clear to auscultation bilaterally; normal respiratory effort CV:         Regular rate and rhythm; no murmurs Abd:      + bowel sounds; soft, non-tender; no palpable masses, no distension Ext:    No edema; adequate peripheral perfusion Skin:      Warm and dry; no rash Neuro: alert and oriented x 3 Psych: normal mood and affect  Data Reviewed: Chest x-ray 10/23/15- no active pulmonary process, multiple gallstones All  images reviewed.  PFTs 06/21/16 FVC 2.17 [81%) FEV1 1.84 [92%) F/F 85 TLC 78% DLCO 62%, DLCO/VA 92% Minimal restriction, moderate diffusion defect which corrects for alveolar volume.  FENO 07/23/16- 28  Assessment:  Chronic cough Her symptoms are likely postinflammatory from an episode of viral bronchitis in January. She has upper airway cough syndrome exacerbated by postnasal drip and GERD. She has slightly elevated FENO and PFTs which showed reduction in mid flow rates suggestive of small and reactive airways disease.  Her cough is improving. She will continue to use the chlorpheniramine and flonase as needed. She is also on prilosec once daily for GERD.  Concern for fibrosis Her PFTs shows minimal restriction and DLCO that corrects for alveolar volume. I suspect abnormalities are secondary to her body habitus. There is no  evidence of interstitial lung disease or fibrosis on prior chest imaging. She's had recent chest x-rays in January and I'll try to obtain these reports for review  Plan/Recommendations: - Continue Chlorpheniramine, flonase nasal sprays - Prilosec - Albuterol inhaler.  Marshell Garfinkel MD [facility] Pager [phone] 09/18/2016, 2:43 PM  CC: Prince Solian, MD   Addendum Chest x-ray from primary care office 06/10/16 Comparison 04/24/2016, 01/19/2014 Heart is normal in size, aorta moderately ectatic. No infiltrates, masses, edema or effusions. Bony thorax is intact Impression: No active disease.

## 2016-09-19 NOTE — Telephone Encounter (Signed)
Records have been received and given to PM for review. Nothing further needed.  

## 2016-11-14 DIAGNOSIS — Z85828 Personal history of other malignant neoplasm of skin: Secondary | ICD-10-CM | POA: Diagnosis not present

## 2016-11-14 DIAGNOSIS — L820 Inflamed seborrheic keratosis: Secondary | ICD-10-CM | POA: Diagnosis not present

## 2016-11-14 DIAGNOSIS — L298 Other pruritus: Secondary | ICD-10-CM | POA: Diagnosis not present

## 2016-12-13 ENCOUNTER — Ambulatory Visit (INDEPENDENT_AMBULATORY_CARE_PROVIDER_SITE_OTHER): Payer: Medicare PPO

## 2016-12-13 ENCOUNTER — Ambulatory Visit (INDEPENDENT_AMBULATORY_CARE_PROVIDER_SITE_OTHER): Payer: Medicare PPO | Admitting: Podiatry

## 2016-12-13 ENCOUNTER — Ambulatory Visit: Payer: Medicare PPO

## 2016-12-13 ENCOUNTER — Encounter: Payer: Self-pay | Admitting: Podiatry

## 2016-12-13 DIAGNOSIS — R52 Pain, unspecified: Secondary | ICD-10-CM

## 2016-12-13 DIAGNOSIS — M779 Enthesopathy, unspecified: Secondary | ICD-10-CM

## 2016-12-13 DIAGNOSIS — M79671 Pain in right foot: Secondary | ICD-10-CM

## 2016-12-13 DIAGNOSIS — M79672 Pain in left foot: Secondary | ICD-10-CM | POA: Diagnosis not present

## 2016-12-13 MED ORDER — MELOXICAM 15 MG PO TABS: 15.0000 mg | ORAL_TABLET | Freq: Every day | ORAL | 0 refills | Status: DC

## 2016-12-16 NOTE — Progress Notes (Signed)
Subjective: Natalie [name] presents the also concerns of right foot pain which has been ongoing for about 2 weeks. She denies any recent injury or trauma she denies any change increase in intensity. She's had intermittent foot pain to both feet last 6 months but denies any injury to either of her feet. First her right foot she's been soaking in Epson salts was admitted to feel better but the pain does not go completely away. She denies any redness that she still gets some swelling she states. Denies any systemic complaints such as fevers, chills, nausea, vomiting. No acute changes since last appointment, and no other complaints at this time.   Objective: AAO x3, NAD DP/PT pulses palpable bilaterally, CRT less than 3 seconds On the right foot there is tenderness on the second MPJ. There is tenderness along the second interspace as well as associated majority of tenderness. There is localized edema there is no erythema or increase in warmth. There is no pain vibratory sensation. There does be some mild discomfort in the plantar aspect of the second digit just distal to the metatarsal head. There is no other central area of tenderness identified bilaterally at today's appointment. There is no open lesions or pre-ulcerative lesions. Is no pain with calf compression, swelling, warmth, erythema.   Assessment: Capsulitis, tendonitis right second MPJ  Plan: -Treatment options discussed including all alternatives, risks, and complications -Etiology of symptoms were discussed -X-rays were obtained and reviewed with the patient. No evidence of acute fracture identified today. -Today she is requesting steroid injection of the right foot. I discussed the risks and complications of doing this and she understands this and she wishes to proceed. Under sterile conditions a mix of Kenalog local anesthetic was infiltrated into the area of maximal tenderness mostly on second interspace. She tolerated this well any  complications. -Prescribed mobic. Discussed side effects of the medication and directed to stop if any are to occur and call the office.  -Ice. -Recommended immobilization she has a cam boot at home and 1 her to wear this at the next 1-2 weeks until her pain improves. -Follow-up in 2 weeks if symptoms are not improved or sooner if needed. Encouraged to call any questions or concerns or any change in symptoms.  Celesta Gentile, DPM

## 2017-02-12 ENCOUNTER — Ambulatory Visit (INDEPENDENT_AMBULATORY_CARE_PROVIDER_SITE_OTHER): Payer: Medicare PPO | Admitting: Pulmonary Disease

## 2017-02-12 ENCOUNTER — Encounter: Payer: Self-pay | Admitting: Pulmonary Disease

## 2017-02-12 VITALS — BP 124/76 | HR 68 | Ht 62.0 in | Wt 151.6 lb

## 2017-02-12 DIAGNOSIS — R058 Other specified cough: Secondary | ICD-10-CM

## 2017-02-12 DIAGNOSIS — R05 Cough: Secondary | ICD-10-CM

## 2017-02-12 LAB — NITRIC OXIDE: Nitric Oxide: 24

## 2017-02-12 MED ORDER — AZELASTINE HCL 0.1 % NA SOLN: 2.0000 | Freq: Two times a day (BID) | NASAL | 12 refills | Status: DC

## 2017-02-12 MED ORDER — CHLORPHENIRAMINE MALEATE 4 MG PO TABS: 8.0000 mg | ORAL_TABLET | Freq: Three times a day (TID) | ORAL | 1 refills | Status: DC

## 2017-02-12 NOTE — Progress Notes (Addendum)
ALIMA NASER    [id number]    1945-07-15  Primary Care Physician:Avva, Steva Ready, MD  Referring Physician: Prince Solian, [address], [place] [id number]  Chief complaint:  Follow up for upper airway cough.  HPI: Natalie [name] is a 71 year old with past history of arthritis, hyperlipidemia, hypertension. She's had an episode of viral illness and January, tested negative for flu. His symptoms at that time consisted of cough with productive white mucus, dyspnea, wheezing. She feels that the mucus production and dyspnea is improved but she continues to have chronic cough, nonproductive in nature with occasional wheezing. She has had a couple of chest x-rays at Dr. Bari Mantis office which was reportedly negative. I don't have the images to review. She has been tried on multiple prednisone tapers which did not help with the symptoms. She is also on Prilosec for GERD. She has issues with allergies, postnasal drip. She saw Dr. Cresenciano Lick [facility] and was given Zyrtec and Flonase. She has history of seasonal allergies and episodic reactive airway disease.  Interim History: Cough with postnasal drip, throat irritation is worse than before.  Has occasional dyspnea with wheezing She uses the chlorphentermine intermittently.  Outpatient Encounter Prescriptions as of 02/12/2017  Medication Sig  . aspirin 81 MG tablet Take 81 mg by mouth daily.    Marland Kitchen aspirin-acetaminophen-caffeine (EXCEDRIN MIGRAINE) 250-250-65 MG per tablet Take 1 tablet by mouth every 6 (six) hours as needed.  Marland Kitchen atenolol-chlorthalidone (TENORETIC) 100-25 MG per tablet Take 0.5 tablets by mouth daily.    Marland Kitchen atorvastatin (LIPITOR) 20 MG tablet Take 20 mg by mouth daily.    . chlorpheniramine (CHLOR-TRIMETON) 4 MG tablet Take 2 tablets (8 mg total) by mouth 3 (three) times daily.  . Cholecalciferol (VITAMIN D) 2000 UNITS tablet Take 2,000 Units by mouth daily.    Marland Kitchen estradiol (ESTRACE) 0.5 MG tablet Take 1 tablet (0.5 mg  total) by mouth daily.  . meloxicam (MOBIC) 15 MG tablet Take 1 tablet (15 mg total) by mouth daily.  . NONFORMULARY OR COMPOUNDED ITEM [facility]:  Anti-inflammatory cream - Diclofenac 3%, Baclofen 2%, Lidocaine 2%, apply 1-2 grams to affected area 3-4 times daily  . nystatin (MYCOSTATIN/NYSTOP) powder Apply topically 4 (four) times daily.  Marland Kitchen omeprazole (PRILOSEC) 20 MG capsule Take 1 capsule (20 mg total) by mouth 2 (two) times daily.  . [DISCONTINUED] omeprazole (PRILOSEC) 20 MG capsule    No facility-administered encounter medications on file as of 02/12/2017.     Allergies as of 02/12/2017 - Review Complete 02/12/2017  Allergen Reaction Noted  . Cephalexin  05/28/2011  . Nitrofurantoin monohyd macro  05/28/2011  . Sulfa antibiotics  11/29/2010    Past Medical History:  Diagnosis Date  . Arthritis   . Back pain   . Change in hearing   . Generalized headaches   . Heart murmur   . Hyperlipidemia   . Hypertension   . Joint pain   . Leg pain   . Osteopenia 04/2015   T score -1.9 FRAX 12%/2.6% stable from prior DEXA  . Varicose veins of leg with pain Left  > Right    Past Surgical History:  Procedure Laterality Date  . ABDOMINAL HYSTERECTOMY  1982   TAH BSO  . arm surgery  1998 right arm  . CESAREAN SECTION    . ECTOPIC PREGNANCY SURGERY    . ENDOVENOUS ABLATION SAPHENOUS VEIN W/ LASER  left leg 10 years ago per pt.  Marland Kitchen  EXPLORATORY TYMPANOTOMY    . FINGER SURGERY  2007  right hand 4th and 5th digits  . STAPEDECTOMY  2001  . STAPEDECTOMY  revision 2002  . TONSILLECTOMY  1954  . VEIN LIGATION AND STRIPPING  9-12   LEFT- RIGHT LEG INJECTED FOR SPIDER VEINS    Family History  Problem Relation Age of Onset  . Heart disease Mother   . Hypertension Mother   . Heart disease Father   . Hypertension Father     Social History   Social History  . Marital status: Married    Spouse name: N/A  . Number of children: N/A  . Years of education: N/A   Occupational  History  . Not on file.   Social History Main Topics  . Smoking status: Never Smoker  . Smokeless tobacco: Never Used  . Alcohol use No  . Drug use: No  . Sexual activity: Yes    Birth control/ protection: None   Other Topics Concern  . Not on file   Social History Narrative  . No narrative on file    Review of systems: Review of Systems  Constitutional: Negative for fever and chills.  HENT: Negative.   Eyes: Negative for blurred vision.  Respiratory: as per HPI  Cardiovascular: Negative for chest pain and palpitations.  Gastrointestinal: Negative for vomiting, diarrhea, blood per rectum. Genitourinary: Negative for dysuria, urgency, frequency and hematuria.  Musculoskeletal: Negative for myalgias, back pain and joint pain.  Skin: Negative for itching and rash.  Neurological: Negative for dizziness, tremors, focal weakness, seizures and loss of consciousness.  Endo/Heme/Allergies: Negative for environmental allergies.  Psychiatric/Behavioral: Negative for depression, suicidal ideas and hallucinations.  All other systems reviewed and are negative.  Physical Exam: Blood pressure 122/70, pulse 66, height 5\' 2"  (1.575 m), weight 151 lb (68.5 kg), SpO2 95 %. Gen:      No acute distress HEENT:  EOMI, sclera anicteric Neck:     No masses; no thyromegaly Lungs:    Clear to auscultation bilaterally; normal respiratory effort CV:         Regular rate and rhythm; no murmurs Abd:      + bowel sounds; soft, non-tender; no palpable masses, no distension Ext:    No edema; adequate peripheral perfusion Skin:      Warm and dry; no rash Neuro: alert and oriented x 3 Psych: normal mood and affect  Data Reviewed: Chest x-ray 10/23/15- no active pulmonary process, multiple gallstones All images reviewed.  Chest x-ray from primary care office 06/10/16 Comparison 04/24/2016, 01/19/2014 Heart is normal in size, aorta moderately ectatic. No infiltrates, masses, edema or effusions. Bony  thorax is intact Impression: No active disease  PFTs 06/21/16 FVC 2.17 [81%), FEV1 1.84 [92%), F/F 85, TLC 78%, DLCO 62%, DLCO/VA 92% Minimal restriction, moderate diffusion defect which corrects for alveolar volume.  FENO 07/23/16- 28 FENO 02/12/17-24  Assessment:  Chronic cough Upper airway cough syndrome exacerbated by postnasal drip and GERD. She has slightly elevated FENO and PFTs which showed reduction in mid flow rates suggestive of small and reactive airways disease. No need for crontroller inhalers. She will continue on albuterol. FENO was low in office today.    She will resume chlorphentermine 8 mg 3 times daily and start Astelin nasal spray Continue on Protonix for GERD  Concern for fibrosis Her PFTs shows minimal restriction and DLCO that corrects for alveolar volume. I suspect abnormalities are secondary to her body habitus. There is no evidence of interstitial lung  disease or fibrosis on prior chest imaging.   Plan/Recommendations: - Start chlorpheniramine, Astelin nasal spray - Prilosec - Albuterol inhaler.  Marshell Garfinkel MD [facility] Pager [phone] 02/12/2017, 3:12 PM  CC: Avva, Ravisankar, MD   Received records from primary care office Lab test 02/21/17 Complaints of metabolic panel-within normal limits CBC-WBC 7.7, eosinophils 3.5%, absolute eosinophil count 270

## 2017-02-12 NOTE — Patient Instructions (Signed)
Increase chlorpheniramine to 8 mg 3 times daily Start Astelin nasal spray Continue Prilosec as you are doing now  Follow-up 1-2 months.

## 2017-02-17 ENCOUNTER — Telehealth: Payer: Self-pay | Admitting: Pulmonary Disease

## 2017-02-17 NOTE — Telephone Encounter (Signed)
Advised pt I apologize for the mix up on sending a RX to [facility]. I cannot see how this happened in her chart but assured her that PM wanted her to take this medication because he wrote for 12 refills. Pt does not want the pharmacy think she is getting multiple Rx's from them. She has an appt on 12/10 and I advised her to discuss with PM and see if this is a medication he wants her to continue on a l;ong term basis. Nothing further is needed.

## 2017-02-21 DIAGNOSIS — I10 Essential (primary) hypertension: Secondary | ICD-10-CM | POA: Diagnosis not present

## 2017-02-21 DIAGNOSIS — M859 Disorder of bone density and structure, unspecified: Secondary | ICD-10-CM | POA: Diagnosis not present

## 2017-02-21 DIAGNOSIS — R82998 Other abnormal findings in urine: Secondary | ICD-10-CM | POA: Diagnosis not present

## 2017-02-21 DIAGNOSIS — E7849 Other hyperlipidemia: Secondary | ICD-10-CM | POA: Diagnosis not present

## 2017-02-28 DIAGNOSIS — I10 Essential (primary) hypertension: Secondary | ICD-10-CM | POA: Diagnosis not present

## 2017-02-28 DIAGNOSIS — Z23 Encounter for immunization: Secondary | ICD-10-CM | POA: Diagnosis not present

## 2017-02-28 DIAGNOSIS — J3089 Other allergic rhinitis: Secondary | ICD-10-CM | POA: Diagnosis not present

## 2017-02-28 DIAGNOSIS — I872 Venous insufficiency (chronic) (peripheral): Secondary | ICD-10-CM | POA: Diagnosis not present

## 2017-02-28 DIAGNOSIS — K219 Gastro-esophageal reflux disease without esophagitis: Secondary | ICD-10-CM | POA: Diagnosis not present

## 2017-02-28 DIAGNOSIS — Z0000 Encounter for general adult medical examination without abnormal findings: Secondary | ICD-10-CM | POA: Diagnosis not present

## 2017-02-28 DIAGNOSIS — M25512 Pain in left shoulder: Secondary | ICD-10-CM | POA: Diagnosis not present

## 2017-02-28 DIAGNOSIS — M1990 Unspecified osteoarthritis, unspecified site: Secondary | ICD-10-CM | POA: Diagnosis not present

## 2017-02-28 DIAGNOSIS — M859 Disorder of bone density and structure, unspecified: Secondary | ICD-10-CM | POA: Diagnosis not present

## 2017-02-28 DIAGNOSIS — E7849 Other hyperlipidemia: Secondary | ICD-10-CM | POA: Diagnosis not present

## 2017-03-06 DIAGNOSIS — Z1212 Encounter for screening for malignant neoplasm of rectum: Secondary | ICD-10-CM | POA: Diagnosis not present

## 2017-03-31 ENCOUNTER — Ambulatory Visit: Payer: Medicare PPO | Admitting: Pulmonary Disease

## 2017-04-04 ENCOUNTER — Ambulatory Visit: Payer: Medicare PPO | Admitting: Pulmonary Disease

## 2017-04-22 HISTORY — PX: SHOULDER SURGERY: SHX246

## 2017-04-23 DIAGNOSIS — L821 Other seborrheic keratosis: Secondary | ICD-10-CM | POA: Diagnosis not present

## 2017-04-23 DIAGNOSIS — B356 Tinea cruris: Secondary | ICD-10-CM | POA: Diagnosis not present

## 2017-04-23 DIAGNOSIS — L814 Other melanin hyperpigmentation: Secondary | ICD-10-CM | POA: Diagnosis not present

## 2017-04-23 DIAGNOSIS — L820 Inflamed seborrheic keratosis: Secondary | ICD-10-CM | POA: Diagnosis not present

## 2017-04-23 DIAGNOSIS — D1801 Hemangioma of skin and subcutaneous tissue: Secondary | ICD-10-CM | POA: Diagnosis not present

## 2017-04-23 DIAGNOSIS — C4441 Basal cell carcinoma of skin of scalp and neck: Secondary | ICD-10-CM | POA: Diagnosis not present

## 2017-04-23 DIAGNOSIS — Z85828 Personal history of other malignant neoplasm of skin: Secondary | ICD-10-CM | POA: Diagnosis not present

## 2017-04-23 DIAGNOSIS — L570 Actinic keratosis: Secondary | ICD-10-CM | POA: Diagnosis not present

## 2017-04-23 DIAGNOSIS — D485 Neoplasm of uncertain behavior of skin: Secondary | ICD-10-CM | POA: Diagnosis not present

## 2017-05-16 DIAGNOSIS — H04123 Dry eye syndrome of bilateral lacrimal glands: Secondary | ICD-10-CM | POA: Diagnosis not present

## 2017-05-16 DIAGNOSIS — H43813 Vitreous degeneration, bilateral: Secondary | ICD-10-CM | POA: Diagnosis not present

## 2017-05-16 DIAGNOSIS — H5213 Myopia, bilateral: Secondary | ICD-10-CM | POA: Diagnosis not present

## 2017-05-16 DIAGNOSIS — H53002 Unspecified amblyopia, left eye: Secondary | ICD-10-CM | POA: Diagnosis not present

## 2017-06-16 ENCOUNTER — Ambulatory Visit (INDEPENDENT_AMBULATORY_CARE_PROVIDER_SITE_OTHER): Payer: Medicare PPO | Admitting: Women's Health

## 2017-06-16 ENCOUNTER — Encounter: Payer: Self-pay | Admitting: Women's Health

## 2017-06-16 VITALS — BP 122/78 | Ht 62.0 in | Wt 153.0 lb

## 2017-06-16 DIAGNOSIS — Z79890 Hormone replacement therapy (postmenopausal): Secondary | ICD-10-CM

## 2017-06-16 DIAGNOSIS — Z01411 Encounter for gynecological examination (general) (routine) with abnormal findings: Secondary | ICD-10-CM

## 2017-06-16 DIAGNOSIS — L989 Disorder of the skin and subcutaneous tissue, unspecified: Secondary | ICD-10-CM | POA: Diagnosis not present

## 2017-06-16 MED ORDER — NYSTATIN-TRIAMCINOLONE 100000-0.1 UNIT/GM-% EX OINT: 1.0000 "application " | TOPICAL_OINTMENT | Freq: Two times a day (BID) | CUTANEOUS | 0 refills | Status: DC

## 2017-06-16 MED ORDER — ESTRADIOL 0.5 MG PO TABS: 0.5000 mg | ORAL_TABLET | Freq: Every day | ORAL | 4 refills | Status: DC

## 2017-06-16 NOTE — Patient Instructions (Signed)
Health Maintenance for Postmenopausal Women Menopause is a normal process in which your reproductive ability comes to an end. This process happens gradually over a span of months to years, usually between the ages of 22 and 9. Menopause is complete when you have missed 12 consecutive menstrual periods. It is important to talk with your health care provider about some of the most common conditions that affect postmenopausal women, such as heart disease, cancer, and bone loss (osteoporosis). Adopting a healthy lifestyle and getting preventive care can help to promote your health and wellness. Those actions can also lower your chances of developing some of these common conditions. What should I know about menopause? During menopause, you may experience a number of symptoms, such as:  Moderate-to-severe hot flashes.  Night sweats.  Decrease in sex drive.  Mood swings.  Headaches.  Tiredness.  Irritability.  Memory problems.  Insomnia.  Choosing to treat or not to treat menopausal changes is an individual decision that you make with your health care provider. What should I know about hormone replacement therapy and supplements? Hormone therapy products are effective for treating symptoms that are associated with menopause, such as hot flashes and night sweats. Hormone replacement carries certain risks, especially as you become older. If you are thinking about using estrogen or estrogen with progestin treatments, discuss the benefits and risks with your health care provider. What should I know about heart disease and stroke? Heart disease, heart attack, and stroke become more likely as you age. This may be due, in part, to the hormonal changes that your body experiences during menopause. These can affect how your body processes dietary fats, triglycerides, and cholesterol. Heart attack and stroke are both medical emergencies. There are many things that you can do to help prevent heart disease  and stroke:  Have your blood pressure checked at least every 1-2 years. High blood pressure causes heart disease and increases the risk of stroke.  If you are 53-22 years old, ask your health care provider if you should take aspirin to prevent a heart attack or a stroke.  Do not use any tobacco products, including cigarettes, chewing tobacco, or electronic cigarettes. If you need help quitting, ask your health care provider.  It is important to eat a healthy diet and maintain a healthy weight. ? Be sure to include plenty of vegetables, fruits, low-fat dairy products, and lean protein. ? Avoid eating foods that are high in solid fats, added sugars, or salt (sodium).  Get regular exercise. This is one of the most important things that you can do for your health. ? Try to exercise for at least 150 minutes each week. The type of exercise that you do should increase your heart rate and make you sweat. This is known as moderate-intensity exercise. ? Try to do strengthening exercises at least twice each week. Do these in addition to the moderate-intensity exercise.  Know your numbers.Ask your health care provider to check your cholesterol and your blood glucose. Continue to have your blood tested as directed by your health care provider.  What should I know about cancer screening? There are several types of cancer. Take the following steps to reduce your risk and to catch any cancer development as early as possible. Breast Cancer  Practice breast self-awareness. ? This means understanding how your breasts normally appear and feel. ? It also means doing regular breast self-exams. Let your health care provider know about any changes, no matter how small.  If you are 40  or older, have a clinician do a breast exam (clinical breast exam or CBE) every year. Depending on your age, family history, and medical history, it may be recommended that you also have a yearly breast X-ray (mammogram).  If you  have a family history of breast cancer, talk with your health care provider about genetic screening.  If you are at high risk for breast cancer, talk with your health care provider about having an MRI and a mammogram every year.  Breast cancer (BRCA) gene test is recommended for women who have family members with BRCA-related cancers. Results of the assessment will determine the need for genetic counseling and BRCA1 and for BRCA2 testing. BRCA-related cancers include these types: ? Breast. This occurs in males or females. ? Ovarian. ? Tubal. This may also be called fallopian tube cancer. ? Cancer of the abdominal or pelvic lining (peritoneal cancer). ? Prostate. ? Pancreatic.  Cervical, Uterine, and Ovarian Cancer Your health care provider may recommend that you be screened regularly for cancer of the pelvic organs. These include your ovaries, uterus, and vagina. This screening involves a pelvic exam, which includes checking for microscopic changes to the surface of your cervix (Pap test).  For women ages 21-65, health care providers may recommend a pelvic exam and a Pap test every three years. For women ages 79-65, they may recommend the Pap test and pelvic exam, combined with testing for human papilloma virus (HPV), every five years. Some types of HPV increase your risk of cervical cancer. Testing for HPV may also be done on women of any age who have unclear Pap test results.  Other health care providers may not recommend any screening for nonpregnant women who are considered low risk for pelvic cancer and have no symptoms. Ask your health care provider if a screening pelvic exam is right for you.  If you have had past treatment for cervical cancer or a condition that could lead to cancer, you need Pap tests and screening for cancer for at least 20 years after your treatment. If Pap tests have been discontinued for you, your risk factors (such as having a new sexual partner) need to be  reassessed to determine if you should start having screenings again. Some women have medical problems that increase the chance of getting cervical cancer. In these cases, your health care provider may recommend that you have screening and Pap tests more often.  If you have a family history of uterine cancer or ovarian cancer, talk with your health care provider about genetic screening.  If you have vaginal bleeding after reaching menopause, tell your health care provider.  There are currently no reliable tests available to screen for ovarian cancer.  Lung Cancer Lung cancer screening is recommended for adults 69-62 years old who are at high risk for lung cancer because of a history of smoking. A yearly low-dose CT scan of the lungs is recommended if you:  Currently smoke.  Have a history of at least 30 pack-years of smoking and you currently smoke or have quit within the past 15 years. A pack-year is smoking an average of one pack of cigarettes per day for one year.  Yearly screening should:  Continue until it has been 15 years since you quit.  Stop if you develop a health problem that would prevent you from having lung cancer treatment.  Colorectal Cancer  This type of cancer can be detected and can often be prevented.  Routine colorectal cancer screening usually begins at  age 42 and continues through age 45.  If you have risk factors for colon cancer, your health care provider may recommend that you be screened at an earlier age.  If you have a family history of colorectal cancer, talk with your health care provider about genetic screening.  Your health care provider may also recommend using home test kits to check for hidden blood in your stool.  A small camera at the end of a tube can be used to examine your colon directly (sigmoidoscopy or colonoscopy). This is done to check for the earliest forms of colorectal cancer.  Direct examination of the colon should be repeated every  5-10 years until age 71. However, if early forms of precancerous polyps or small growths are found or if you have a family history or genetic risk for colorectal cancer, you may need to be screened more often.  Skin Cancer  Check your skin from head to toe regularly.  Monitor any moles. Be sure to tell your health care provider: ? About any new moles or changes in moles, especially if there is a change in a mole's shape or color. ? If you have a mole that is larger than the size of a pencil eraser.  If any of your family members has a history of skin cancer, especially at a Scar[place] Abad age, talk with your health care provider about genetic screening.  Always use sunscreen. Apply sunscreen liberally and repeatedly throughout the day.  Whenever you are outside, protect yourself by wearing long sleeves, pants, a wide-brimmed hat, and sunglasses.  What should I know about osteoporosis? Osteoporosis is a condition in which bone destruction happens more quickly than new bone creation. After menopause, you may be at an increased risk for osteoporosis. To help prevent osteoporosis or the bone fractures that can happen because of osteoporosis, the following is recommended:  If you are 46-71 years old, get at least 1,000 mg of calcium and at least 600 mg of vitamin D per day.  If you are older than age 55 but younger than age 65, get at least 1,200 mg of calcium and at least 600 mg of vitamin D per day.  If you are older than age 54, get at least 1,200 mg of calcium and at least 800 mg of vitamin D per day.  Smoking and excessive alcohol intake increase the risk of osteoporosis. Eat foods that are rich in calcium and vitamin D, and do weight-bearing exercises several times each week as directed by your health care provider. What should I know about how menopause affects my mental health? Depression may occur at any age, but it is more common as you become older. Common symptoms of depression  include:  Low or sad mood.  Changes in sleep patterns.  Changes in appetite or eating patterns.  Feeling an overall lack of motivation or enjoyment of activities that you previously enjoyed.  Frequent crying spells.  Talk with your health care provider if you think that you are experiencing depression. What should I know about immunizations? It is important that you get and maintain your immunizations. These include:  Tetanus, diphtheria, and pertussis (Tdap) booster vaccine.  Influenza every year before the flu season begins.  Pneumonia vaccine.  Shingles vaccine.  Your health care provider may also recommend other immunizations. This information is not intended to replace advice given to you by your health care provider. Make sure you discuss any questions you have with your health care provider. Document Released: 05/31/2005  Document Revised: 10/27/2015 Document Reviewed: 01/10/2015 Elsevier Interactive Patient Education  2018 [employer]  

## 2017-06-16 NOTE — Progress Notes (Signed)
Natalie [name] Aug 27, 1945 [id number]    History:    Presents for breast and pelvic exam. Normal Pap and mammogram history. 1982 TAH with BSO for DUB on estradiol 0.25 daily. Increased hot flushes with poor sleep when trying to stop HRT in the past. 2013 2 benign colon polyps. 2017 T score -1.3 FRAX 12%/2.6% stable. Declined medication. Shoulder surgery needed. Primary care manages hypertension and hypercholesterolemia. Vaccines current. Report stressful year, husband cardiac arrest while walking, CPR performed, had open heart surgery and is now doing well. Current on vaccines.  Past medical history, past surgical history, family history and social history were all reviewed and documented in the EPIC chart. 2 children both doing well, has planned a [place] cruise vacation with family.  ROS:  A ROS was performed and pertinent positives and negatives are included.  Exam:  Vitals:   06/16/17 1415  Weight: 153 lb (69.4 kg)  Height: 5\' 2"  (1.575 m)   Body mass index is 27.98 kg/m.   General appearance:  Normal Thyroid:  Symmetrical, normal in size, without palpable masses or nodularity. Respiratory  Auscultation:  Clear without wheezing or rhonchi Cardiovascular  Auscultation:  Regular rate, without rubs, murmurs or gallops  Edema/varicosities:  Not grossly evident Abdominal  Soft,nontender, without masses, guarding or rebound.  Liver/spleen:  No organomegaly noted  Hernia:  None appreciated  Skin  Inspection:  Grossly normal mild erythematous yeast appearing patch at gluteal fold   Breasts: Examined lying and sitting.     Right: Without masses, retractions, discharge or axillary adenopathy.     Left: Without masses, retractions, discharge or axillary adenopathy. Gentitourinary   Inguinal/mons:  Normal without inguinal adenopathy  External genitalia:  Normal  BUS/Urethra/Skene's glands:  Normal  Vagina:  Normal  Cervix:  And uterus absent              Adnexa/parametria:      Rt: Without masses or tenderness.   Lt: Without masses or tenderness.  Anus and perineum: Normal  Digital rectal exam: Normal sphincter tone without palpated masses or tenderness  Assessment/Plan:  72 y.o. M WF G4 P2  for breast and pelvic exam.  1982 TAH with BSO on HRT Hypertension/hypercholesterolemia-primary care manages labs and meds Mild irritation at gluteal fold Osteopenia without elevated FRAX  Plan: Mycolog apply small amount twice daily call if no relief or follow-up with primary care. Loose clothing, open to air as able. SBE's, overdue for mammogram instructed to schedule. Due for follow-up colonoscopy, instructed to schedule. Has follow-up scheduled with orthopedist for shoulder problem. Home safety, fall prevention and importance of weightbearing exercise reviewed. Vitamin D 2000 daily encouraged. Estradiol half tablet a 0.5 mg daily, reviewed minimal risks of blood clots, strokes and breast cancer but best if no HRT, will call if continued hot flushes after stopping.    Huel Cote [facility], 2:17 PM 06/16/2017

## 2017-06-18 ENCOUNTER — Other Ambulatory Visit: Payer: Self-pay | Admitting: Gynecology

## 2017-06-18 DIAGNOSIS — Z1231 Encounter for screening mammogram for malignant neoplasm of breast: Secondary | ICD-10-CM

## 2017-06-25 DIAGNOSIS — Z85828 Personal history of other malignant neoplasm of skin: Secondary | ICD-10-CM | POA: Diagnosis not present

## 2017-06-25 DIAGNOSIS — L570 Actinic keratosis: Secondary | ICD-10-CM | POA: Diagnosis not present

## 2017-06-25 DIAGNOSIS — L820 Inflamed seborrheic keratosis: Secondary | ICD-10-CM | POA: Diagnosis not present

## 2017-07-01 ENCOUNTER — Ambulatory Visit: Payer: Medicare PPO

## 2017-07-18 ENCOUNTER — Ambulatory Visit: Payer: Medicare PPO

## 2017-07-23 ENCOUNTER — Ambulatory Visit
Admission: RE | Admit: 2017-07-23 | Discharge: 2017-07-23 | Disposition: A | Discharge status: Home / Self Care | Payer: Medicare PPO | Source: Ambulatory Visit | Attending: Gynecology | Admitting: Gynecology

## 2017-07-23 DIAGNOSIS — Z1231 Encounter for screening mammogram for malignant neoplasm of breast: Secondary | ICD-10-CM

## 2017-07-30 DIAGNOSIS — M75102 Unspecified rotator cuff tear or rupture of left shoulder, not specified as traumatic: Secondary | ICD-10-CM | POA: Diagnosis not present

## 2017-08-04 ENCOUNTER — Other Ambulatory Visit: Payer: Self-pay | Admitting: Pulmonary Disease

## 2017-08-26 DIAGNOSIS — M7542 Impingement syndrome of left shoulder: Secondary | ICD-10-CM | POA: Diagnosis not present

## 2017-08-26 DIAGNOSIS — M75122 Complete rotator cuff tear or rupture of left shoulder, not specified as traumatic: Secondary | ICD-10-CM | POA: Diagnosis not present

## 2017-08-26 DIAGNOSIS — S43432A Superior glenoid labrum lesion of left shoulder, initial encounter: Secondary | ICD-10-CM | POA: Diagnosis not present

## 2017-08-26 DIAGNOSIS — M7522 Bicipital tendinitis, left shoulder: Secondary | ICD-10-CM | POA: Diagnosis not present

## 2017-08-26 DIAGNOSIS — G8918 Other acute postprocedural pain: Secondary | ICD-10-CM | POA: Diagnosis not present

## 2017-08-26 DIAGNOSIS — M659 Synovitis and tenosynovitis, unspecified: Secondary | ICD-10-CM | POA: Diagnosis not present

## 2017-08-26 DIAGNOSIS — M94261 Chondromalacia, right knee: Secondary | ICD-10-CM | POA: Diagnosis not present

## 2017-08-26 DIAGNOSIS — M19012 Primary osteoarthritis, left shoulder: Secondary | ICD-10-CM | POA: Diagnosis not present

## 2017-08-26 DIAGNOSIS — M94212 Chondromalacia, left shoulder: Secondary | ICD-10-CM | POA: Diagnosis not present

## 2017-09-10 DIAGNOSIS — M25612 Stiffness of left shoulder, not elsewhere classified: Secondary | ICD-10-CM | POA: Diagnosis not present

## 2017-09-19 DIAGNOSIS — M25612 Stiffness of left shoulder, not elsewhere classified: Secondary | ICD-10-CM | POA: Diagnosis not present

## 2017-09-26 DIAGNOSIS — M25612 Stiffness of left shoulder, not elsewhere classified: Secondary | ICD-10-CM | POA: Diagnosis not present

## 2017-09-29 DIAGNOSIS — M25612 Stiffness of left shoulder, not elsewhere classified: Secondary | ICD-10-CM | POA: Diagnosis not present

## 2017-10-01 DIAGNOSIS — M25612 Stiffness of left shoulder, not elsewhere classified: Secondary | ICD-10-CM | POA: Diagnosis not present

## 2017-10-06 ENCOUNTER — Other Ambulatory Visit: Payer: Self-pay | Admitting: Pulmonary Disease

## 2017-10-06 DIAGNOSIS — M25612 Stiffness of left shoulder, not elsewhere classified: Secondary | ICD-10-CM | POA: Diagnosis not present

## 2017-10-08 DIAGNOSIS — M25612 Stiffness of left shoulder, not elsewhere classified: Secondary | ICD-10-CM | POA: Diagnosis not present

## 2017-10-13 DIAGNOSIS — M25612 Stiffness of left shoulder, not elsewhere classified: Secondary | ICD-10-CM | POA: Diagnosis not present

## 2017-10-15 DIAGNOSIS — M25612 Stiffness of left shoulder, not elsewhere classified: Secondary | ICD-10-CM | POA: Diagnosis not present

## 2017-10-20 DIAGNOSIS — M25612 Stiffness of left shoulder, not elsewhere classified: Secondary | ICD-10-CM | POA: Diagnosis not present

## 2017-10-22 DIAGNOSIS — M25612 Stiffness of left shoulder, not elsewhere classified: Secondary | ICD-10-CM | POA: Diagnosis not present

## 2017-10-28 DIAGNOSIS — M25612 Stiffness of left shoulder, not elsewhere classified: Secondary | ICD-10-CM | POA: Diagnosis not present

## 2017-10-29 DIAGNOSIS — Z5189 Encounter for other specified aftercare: Secondary | ICD-10-CM | POA: Diagnosis not present

## 2017-10-29 DIAGNOSIS — M7502 Adhesive capsulitis of left shoulder: Secondary | ICD-10-CM | POA: Diagnosis not present

## 2017-10-31 DIAGNOSIS — M25612 Stiffness of left shoulder, not elsewhere classified: Secondary | ICD-10-CM | POA: Diagnosis not present

## 2017-11-03 DIAGNOSIS — M25612 Stiffness of left shoulder, not elsewhere classified: Secondary | ICD-10-CM | POA: Diagnosis not present

## 2017-11-07 DIAGNOSIS — M25612 Stiffness of left shoulder, not elsewhere classified: Secondary | ICD-10-CM | POA: Diagnosis not present

## 2017-11-10 DIAGNOSIS — M25612 Stiffness of left shoulder, not elsewhere classified: Secondary | ICD-10-CM | POA: Diagnosis not present

## 2017-11-17 DIAGNOSIS — M25612 Stiffness of left shoulder, not elsewhere classified: Secondary | ICD-10-CM | POA: Diagnosis not present

## 2017-11-21 DIAGNOSIS — M25612 Stiffness of left shoulder, not elsewhere classified: Secondary | ICD-10-CM | POA: Diagnosis not present

## 2017-11-25 DIAGNOSIS — M25612 Stiffness of left shoulder, not elsewhere classified: Secondary | ICD-10-CM | POA: Diagnosis not present

## 2017-12-16 ENCOUNTER — Other Ambulatory Visit: Payer: Self-pay | Admitting: *Deleted

## 2017-12-16 ENCOUNTER — Ambulatory Visit: Payer: Medicare PPO

## 2017-12-16 DIAGNOSIS — I83893 Varicose veins of bilateral lower extremities with other complications: Secondary | ICD-10-CM

## 2017-12-16 NOTE — Progress Notes (Signed)
Patient wanted to see if sclero would work to treat her unwanted veins. She presents with bulging varicose veins as well as some blue spiders on her feet. Had Dr. Donnetta Hutching consult. She had previous laser ablation on the LGSV, stabs and sclerotherapy in 2013. He suggested obtaining bilateral reflux study in 3 months and told her to begin the three month trial of conservative therapy with thigh highs 20-30 mm HG, elevation and Ibuprofen for pain. Study and appt made. We will be able to precert her then if that is what is needed. Follow prn.

## 2018-01-07 DIAGNOSIS — M19012 Primary osteoarthritis, left shoulder: Secondary | ICD-10-CM | POA: Diagnosis not present

## 2018-02-04 ENCOUNTER — Encounter: Payer: Self-pay | Admitting: Women's Health

## 2018-02-04 ENCOUNTER — Ambulatory Visit: Payer: Medicare PPO | Admitting: Women's Health

## 2018-02-04 VITALS — BP 126/78

## 2018-02-04 DIAGNOSIS — N898 Other specified noninflammatory disorders of vagina: Secondary | ICD-10-CM | POA: Diagnosis not present

## 2018-02-04 DIAGNOSIS — R350 Frequency of micturition: Secondary | ICD-10-CM

## 2018-02-04 LAB — WET PREP FOR TRICH, YEAST, CLUE

## 2018-02-04 MED ORDER — FLUCONAZOLE 150 MG PO TABS: 150.0000 mg | ORAL_TABLET | Freq: Once | ORAL | 0 refills | Status: AC

## 2018-02-04 NOTE — Progress Notes (Signed)
we

## 2018-02-04 NOTE — Patient Instructions (Signed)

## 2018-02-04 NOTE — Progress Notes (Signed)
72 year old MWF G4, P2 presents with complaint of vaginal discharge noted on underwear yellow in color and questionable vaginal versus urine odor for the past week.  Only notices the odor with urination.  Denies pain or burning with urination but has had some increased frequency.  Denies abdominal/back pain, nausea or fever.  No recent antibiotic use.  Did have left shoulder surgery 07/2017 is completed rehab and doing okay.  1982 TAH with BSO for DU B on estradiol 0.25 every other day with occasional hot flushes and no bleeding.  Primary care manages hypertension and hypercholesteremia.  Exam: Appears well.  No CVAT.  Abdomen soft without rebound or radiation, external genitalia erythematous at introitus, speculum exam no visible discharge, mild erythema to vaginal walls, no odor noted, wet prep positive for yeast. UA: Urine clear yellow, trace blood, trace leukocytes, 0-5 WBCs, 0-2 RBCs, 0-5 squamous epithelials, no bacteria  Yeast vaginitis  Plan: Diflucan 150 p.o. x1 dose.  Instructed to call if continued problems.  Yeast prevention discussed.  Urine culture pending.

## 2018-02-06 LAB — URINALYSIS, COMPLETE W/RFL CULTURE
BACTERIA UA: NONE SEEN /HPF
BILIRUBIN URINE: NEGATIVE
Glucose, UA: NEGATIVE
HYALINE CAST: NONE SEEN /LPF
KETONES UR: NEGATIVE
Nitrites, Initial: NEGATIVE
PROTEIN: NEGATIVE
Specific Gravity, Urine: 1.01 (ref 1.001–1.03)
pH: 6.5 (ref 5.0–8.0)

## 2018-02-06 LAB — URINE CULTURE
MICRO NUMBER:: 91249145
SPECIMEN QUALITY: ADEQUATE

## 2018-02-06 LAB — CULTURE INDICATED

## 2018-03-02 ENCOUNTER — Ambulatory Visit: Payer: Medicare PPO | Admitting: Podiatry

## 2018-03-02 ENCOUNTER — Ambulatory Visit (INDEPENDENT_AMBULATORY_CARE_PROVIDER_SITE_OTHER): Payer: Medicare PPO

## 2018-03-02 ENCOUNTER — Other Ambulatory Visit: Payer: Self-pay | Admitting: Podiatry

## 2018-03-02 DIAGNOSIS — M779 Enthesopathy, unspecified: Secondary | ICD-10-CM

## 2018-03-02 DIAGNOSIS — M79672 Pain in left foot: Secondary | ICD-10-CM

## 2018-03-02 DIAGNOSIS — M2041 Other hammer toe(s) (acquired), right foot: Secondary | ICD-10-CM

## 2018-03-04 NOTE — Progress Notes (Signed)
Subjective: 72 year old female presents the office today for an acute appointment for pain on her left foot.  She states that she was at the gym this morning and she tripped over a bench and she states that she had pain to the foot.  She states that it felt like it did when she broke her right foot and she went to the area checked.  She was able to ambulate to the office with some discomfort.  She has had some minimal swelling since the injury occurred.  While she is here she also has concerns of her chronic hammertoe of the right second toe which causes irritation inside shoes. Denies any systemic complaints such as fevers, chills, nausea, vomiting. No acute changes since last appointment, and no other complaints at this time.   Objective: AAO x3, NAD DP/PT pulses palpable bilaterally, CRT less than 3 seconds There is mild tenderness palpation and this is most in the course the peroneal tendon posterior and inferior to the lateral malleolus.  There is no area pinpoint bony tenderness to the ankle, tibia, fibula, fifth metatarsal base, talus or other areas of the foot.  There is no pain to vibratory sensation either.  There is some minimal to trace edema along the lateral aspect of the foot on the course the peroneal tendon. Rigid hammertoe contracture present of the right second toe. No open lesions or pre-ulcerative lesions.  No pain with calf compression, swelling, warmth, erythema  Assessment: Peroneal tendinitis; right second toe hammertoe  Plan: -All treatment options discussed with the patient including all alternatives, risks, complications.  -X-rays were obtained reviewed.  No definitive evidence of acute fracture or stress fracture identified today. -Regards to left foot pain she has an ankle brace at home that I gave to her previously that she has not worn.  I want her to wear this.  Ice the area as well as anti-inflammatories.  If symptoms worsen to let me know.  We discussed going to a  walking boot but she is to start with the brace. -In regard to the right second digit hammertoe discussed offloading as well as shoe modifications.  Also discussed surgery including a PIPJ arthrodesis with K wire fixation.  She will consider this in the future. -Follow-up in 2 weeks or sooner if needed. -Patient encouraged to call the office with any questions, concerns, change in symptoms.   Trula Slade DPM

## 2018-03-10 DIAGNOSIS — Z86010 Personal history of colonic polyps: Secondary | ICD-10-CM | POA: Diagnosis not present

## 2018-03-10 DIAGNOSIS — R635 Abnormal weight gain: Secondary | ICD-10-CM | POA: Diagnosis not present

## 2018-03-10 DIAGNOSIS — K5900 Constipation, unspecified: Secondary | ICD-10-CM | POA: Diagnosis not present

## 2018-03-10 DIAGNOSIS — Z1211 Encounter for screening for malignant neoplasm of colon: Secondary | ICD-10-CM | POA: Diagnosis not present

## 2018-03-10 DIAGNOSIS — K219 Gastro-esophageal reflux disease without esophagitis: Secondary | ICD-10-CM | POA: Diagnosis not present

## 2018-03-11 ENCOUNTER — Ambulatory Visit (INDEPENDENT_AMBULATORY_CARE_PROVIDER_SITE_OTHER): Payer: Medicare PPO | Admitting: Vascular Surgery

## 2018-03-11 ENCOUNTER — Ambulatory Visit: Payer: Medicare PPO | Admitting: Vascular Surgery

## 2018-03-11 ENCOUNTER — Other Ambulatory Visit: Payer: Self-pay

## 2018-03-11 ENCOUNTER — Encounter: Payer: Self-pay | Admitting: Vascular Surgery

## 2018-03-11 ENCOUNTER — Encounter (HOSPITAL_COMMUNITY): Payer: Medicare PPO

## 2018-03-11 ENCOUNTER — Ambulatory Visit (HOSPITAL_COMMUNITY)
Admission: RE | Admit: 2018-03-11 | Discharge: 2018-03-11 | Disposition: A | Discharge status: Home / Self Care | Payer: Medicare PPO | Source: Ambulatory Visit | Attending: Vascular Surgery | Admitting: Vascular Surgery

## 2018-03-11 VITALS — BP 152/79 | HR 70 | Temp 97.3°F | Resp 18 | Ht 62.0 in | Wt 154.0 lb

## 2018-03-11 DIAGNOSIS — I83813 Varicose veins of bilateral lower extremities with pain: Secondary | ICD-10-CM

## 2018-03-11 DIAGNOSIS — I83893 Varicose veins of bilateral lower extremities with other complications: Secondary | ICD-10-CM | POA: Diagnosis not present

## 2018-03-11 NOTE — Progress Notes (Signed)
Patient name: Natalie [name] MRN: [id number] DOB: [birth year] Sex: female  HPI: Natalie [name] is a 72 y.o. female, who presents with painful varicose veins left and right leg.  She complains primarily of pain in her left foot and left ankle.  She also has some bulging varicose veins in her right inner thigh and that bother her as well as her right lateral leg.  She previously had a laser ablation of her left greater saphenous vein in 2005.  She subsequently had stab avulsions in her left leg.  She has had more recurrent varicosities.  She was seen by our vein nurse in August of this year and it was recommended that she start wearing compression stockings again.  She has been compliant with this for 3 months.  She does get some mild swelling but is more bothered by aches and pains from the veins.  Other medical problems include arthritis hyperlipidemia hypertension all of which are currently stable.  Past Medical History:  Diagnosis Date  . Arthritis   . Back pain   . Change in hearing   . Generalized headaches   . Heart murmur   . Hyperlipidemia   . Hypertension   . Joint pain   . Leg pain   . Osteopenia 04/2015   T score -1.9 FRAX 12%/2.6% stable from prior DEXA  . Varicose veins of leg with pain Left  > Right   Past Surgical History:  Procedure Laterality Date  . ABDOMINAL HYSTERECTOMY  1982   TAH BSO  . arm surgery  1998 right arm  . CESAREAN SECTION    . ECTOPIC PREGNANCY SURGERY    . ENDOVENOUS ABLATION SAPHENOUS VEIN W/ LASER  left leg 10 years ago per pt.  . EXPLORATORY TYMPANOTOMY    . FINGER SURGERY  2007  right hand 4th and 5th digits  . STAPEDECTOMY  2001  . STAPEDECTOMY  revision 2002  . TONSILLECTOMY  1954  . VEIN LIGATION AND STRIPPING  9-12   LEFT- RIGHT LEG INJECTED FOR SPIDER VEINS    Family History  Problem Relation Age of Onset  . Heart disease Mother   . Hypertension Mother   . Heart disease Father   . Hypertension Father     SOCIAL HISTORY: Social  History   Socioeconomic History  . Marital status: Married    Spouse name: Not on file  . Number of children: Not on file  . Years of education: Not on file  . Highest education level: Not on file  Occupational History  . Not on file  Social Needs  . Financial resource strain: Not on file  . Food insecurity:    Worry: Not on file    Inability: Not on file  . Transportation needs:    Medical: Not on file    Non-medical: Not on file  Tobacco Use  . Smoking status: Never Smoker  . Smokeless tobacco: Never Used  Substance and Sexual Activity  . Alcohol use: No  . Drug use: No  . Sexual activity: Yes    Birth control/protection: None  Lifestyle  . Physical activity:    Days per week: Not on file    Minutes per session: Not on file  . Stress: Not on file  Relationships  . Social connections:    Talks on phone: Not on file    Gets together: Not on file    Attends religious service: Not on file    Active member of club or  organization: Not on file    Attends meetings of clubs or organizations: Not on file    Relationship status: Not on file  . Intimate partner violence:    Fear of current or ex partner: Not on file    Emotionally abused: Not on file    Physically abused: Not on file    Forced sexual activity: Not on file  Other Topics Concern  . Not on file  Social History Narrative  . Not on file    Allergies  Allergen Reactions  . Cephalexin   . Nitrofurantoin Monohyd Macro   . Other   . Sulfa Antibiotics     Current Outpatient Medications  Medication Sig Dispense Refill  . aspirin 81 MG tablet Take 81 mg by mouth daily.      Marland Kitchen aspirin-acetaminophen-caffeine (EXCEDRIN MIGRAINE) 250-250-65 MG per tablet Take 1 tablet by mouth every 6 (six) hours as needed.    Marland Kitchen atenolol-chlorthalidone (TENORETIC) 100-25 MG per tablet Take 0.5 tablets by mouth daily.      Marland Kitchen atorvastatin (LIPITOR) 20 MG tablet Take 20 mg by mouth daily.      Marland Kitchen azelastine (ASTELIN) 0.1 % nasal  spray Place 2 sprays into both nostrils 2 (two) times daily. Use in each nostril as directed 30 mL 12  . chlorpheniramine (CHLOR-TRIMETON) 4 MG tablet Take 2 tablets (8 mg total) by mouth 3 (three) times daily. 90 tablet 1  . Cholecalciferol (VITAMIN D) 2000 UNITS tablet Take 2,000 Units by mouth daily.      Marland Kitchen estradiol (ESTRACE) 0.5 MG tablet Take 1 tablet (0.5 mg total) by mouth daily. 90 tablet 4  . fluconazole (DIFLUCAN) 150 MG tablet TAKE ONE TABLET BY MOUTH AS A ONE TIME DOSE  0  . fluorouracil (EFUDEX) 5 % cream as directed.    Marland Kitchen ketoconazole (NIZORAL) 2 % cream as directed.    . meloxicam (MOBIC) 15 MG tablet Take 1 tablet (15 mg total) by mouth daily. 14 tablet 0  . NONFORMULARY OR COMPOUNDED ITEM [facility]:  Anti-inflammatory cream - Diclofenac 3%, Baclofen 2%, Lidocaine 2%, apply 1-2 grams to affected area 3-4 times daily 120 each 2  . nystatin (MYCOSTATIN/NYSTOP) powder Apply topically 4 (four) times daily. 30 g 0  . nystatin-triamcinolone ointment (MYCOLOG) Apply 1 application topically 2 (two) times daily. 30 g 0  . omeprazole (PRILOSEC) 20 MG capsule TAKE 1 CAPSULE (20 MG TOTAL) TWO TIMES DAILY. 180 capsule 0  . pramipexole (MIRAPEX) 0.5 MG tablet     . tiZANidine (ZANAFLEX) 4 MG capsule Zanaflex 4 mg capsule  Take 1 capsule(s) EVERY 6 HOURS as needed for spasm     No current facility-administered medications for this visit.     ROS:   General:  No weight loss, Fever, chills  HEENT: No recent headaches, no nasal bleeding, no visual changes, no sore throat  Neurologic: No dizziness, blackouts, seizures. No recent symptoms of stroke or mini- stroke. No recent episodes of slurred speech, or temporary blindness.  Cardiac: No recent episodes of chest pain/pressure, no shortness of breath at rest.  No shortness of breath with exertion.  Denies history of atrial fibrillation or irregular heartbeat  Vascular: No history of rest pain in feet.  No history of claudication.   No history of non-healing ulcer, No history of DVT   Pulmonary: No home oxygen, no productive cough, no hemoptysis,  No asthma or wheezing  Musculoskeletal:  [X]  Arthritis, [X]  Low back pain,  [X]  Joint pain  Hematologic:No  history of hypercoagulable state.  No history of easy bleeding.  No history of anemia  Gastrointestinal: No hematochezia or melena,  No gastroesophageal reflux, no trouble swallowing  Urinary: [ ]  chronic Kidney disease, [ ]  on HD - [ ]  MWF or [ ]  TTHS, [ ]  Burning with urination, [ ]  Frequent urination, [ ]  Difficulty urinating;   Skin: No rashes  Psychological: No history of anxiety,  No history of depression   Physical Examination   Vitals:   03/11/18 1617  BP: (!) 152/79  Pulse: 70  Resp: 18  Temp: (!) 97.3 F (36.3 C)  TempSrc: Oral  SpO2: 97%  Weight: 154 lb (69.9 kg)  Height: 5\' 2"  (1.575 m)    General:  Alert and oriented, no acute distress HEENT: Normal Cardiac: Regular Rate and Rhythm  Skin: No rash, scattered spider and reticular type varicosities.  Palpable varicosity in the right upper inner thigh about 3 to 4 mm diameter.  Right lateral knee 3 to 4 mm diameter.  Left leg just above the ankle 4 mm diameter lateral knee 3 to 4 mm diameter         Extremity Pulses:  2+ radial, brachial, femoral, dorsalis pedis, posterior tibial pulses bilaterally Musculoskeletal: No deformity or edema  Neurologic: Upper and lower extremity motor 5/5 and symmetric  DATA:  Had a venous reflux exam today which showed successful closure of the left greater saphenous vein.  No DVT.  Right leg had diffuse reflux with a 4 to 6 mm greater saphenous vein.  I examined her right greater saphenous vein with the SonoSite at the bedside.  This showed a 5 to 6 mm vein extending from the mid thigh up to the groin.  ASSESSMENT: Symptomatic varicose veins with pain left leg greater than right   PLAN: Patient has been compliant wearing her compression stockings for  the last 3 months.  She wishes to pursue a intervention in the left and right legs.  I discussed with her today that her left greater saphenous vein is out of the circuit and she would be a candidate possibly for some additional stab avulsions in the left leg pending insurance approval.  In the right leg I believe she would benefit from right greater saphenous vein laser ablation and stab avulsions.  This would also be pending insurance approval.  We will call her in the near future after we discussed this with Medicare.  In the meanwhile she will continue to wear her compression stockings.   Ruta Hinds, MD [facility] Office: [phone] Pager: [phone]

## 2018-03-27 ENCOUNTER — Other Ambulatory Visit: Payer: Self-pay | Admitting: Pulmonary Disease

## 2018-04-17 DIAGNOSIS — M859 Disorder of bone density and structure, unspecified: Secondary | ICD-10-CM | POA: Diagnosis not present

## 2018-04-17 DIAGNOSIS — E7849 Other hyperlipidemia: Secondary | ICD-10-CM | POA: Diagnosis not present

## 2018-04-17 DIAGNOSIS — I10 Essential (primary) hypertension: Secondary | ICD-10-CM | POA: Diagnosis not present

## 2018-04-17 DIAGNOSIS — R05 Cough: Secondary | ICD-10-CM | POA: Diagnosis not present

## 2018-04-17 DIAGNOSIS — R82998 Other abnormal findings in urine: Secondary | ICD-10-CM | POA: Diagnosis not present

## 2018-04-24 DIAGNOSIS — J3089 Other allergic rhinitis: Secondary | ICD-10-CM | POA: Diagnosis not present

## 2018-04-24 DIAGNOSIS — Z0000 Encounter for general adult medical examination without abnormal findings: Secondary | ICD-10-CM | POA: Diagnosis not present

## 2018-04-24 DIAGNOSIS — M1990 Unspecified osteoarthritis, unspecified site: Secondary | ICD-10-CM | POA: Diagnosis not present

## 2018-04-24 DIAGNOSIS — M859 Disorder of bone density and structure, unspecified: Secondary | ICD-10-CM | POA: Diagnosis not present

## 2018-04-24 DIAGNOSIS — K219 Gastro-esophageal reflux disease without esophagitis: Secondary | ICD-10-CM | POA: Diagnosis not present

## 2018-04-24 DIAGNOSIS — M25512 Pain in left shoulder: Secondary | ICD-10-CM | POA: Diagnosis not present

## 2018-04-24 DIAGNOSIS — I10 Essential (primary) hypertension: Secondary | ICD-10-CM | POA: Diagnosis not present

## 2018-04-24 DIAGNOSIS — M7989 Other specified soft tissue disorders: Secondary | ICD-10-CM | POA: Diagnosis not present

## 2018-04-24 DIAGNOSIS — E7849 Other hyperlipidemia: Secondary | ICD-10-CM | POA: Diagnosis not present

## 2018-05-06 DIAGNOSIS — K621 Rectal polyp: Secondary | ICD-10-CM | POA: Diagnosis not present

## 2018-05-06 DIAGNOSIS — Z800 Family history of malignant neoplasm of digestive organs: Secondary | ICD-10-CM | POA: Diagnosis not present

## 2018-05-06 DIAGNOSIS — K635 Polyp of colon: Secondary | ICD-10-CM | POA: Diagnosis not present

## 2018-05-06 DIAGNOSIS — Z1211 Encounter for screening for malignant neoplasm of colon: Secondary | ICD-10-CM | POA: Diagnosis not present

## 2018-05-06 DIAGNOSIS — D122 Benign neoplasm of ascending colon: Secondary | ICD-10-CM | POA: Diagnosis not present

## 2018-05-06 DIAGNOSIS — D128 Benign neoplasm of rectum: Secondary | ICD-10-CM | POA: Diagnosis not present

## 2018-05-13 DIAGNOSIS — Z1212 Encounter for screening for malignant neoplasm of rectum: Secondary | ICD-10-CM | POA: Diagnosis not present

## 2018-05-19 DIAGNOSIS — I10 Essential (primary) hypertension: Secondary | ICD-10-CM | POA: Diagnosis not present

## 2018-05-19 DIAGNOSIS — N6313 Unspecified lump in the right breast, lower outer quadrant: Secondary | ICD-10-CM | POA: Diagnosis not present

## 2018-05-19 DIAGNOSIS — Z6827 Body mass index (BMI) 27.0-27.9, adult: Secondary | ICD-10-CM | POA: Diagnosis not present

## 2018-05-20 ENCOUNTER — Other Ambulatory Visit: Payer: Self-pay | Admitting: Internal Medicine

## 2018-05-20 DIAGNOSIS — N6310 Unspecified lump in the right breast, unspecified quadrant: Secondary | ICD-10-CM

## 2018-05-27 ENCOUNTER — Ambulatory Visit
Admission: RE | Admit: 2018-05-27 | Discharge: 2018-05-27 | Disposition: A | Discharge status: Home / Self Care | Payer: Medicare PPO | Source: Ambulatory Visit | Attending: Internal Medicine | Admitting: Internal Medicine

## 2018-05-27 DIAGNOSIS — N6310 Unspecified lump in the right breast, unspecified quadrant: Secondary | ICD-10-CM | POA: Diagnosis not present

## 2018-05-27 DIAGNOSIS — R928 Other abnormal and inconclusive findings on diagnostic imaging of breast: Secondary | ICD-10-CM | POA: Diagnosis not present

## 2018-06-03 DIAGNOSIS — Z85828 Personal history of other malignant neoplasm of skin: Secondary | ICD-10-CM | POA: Diagnosis not present

## 2018-06-03 DIAGNOSIS — D2272 Melanocytic nevi of left lower limb, including hip: Secondary | ICD-10-CM | POA: Diagnosis not present

## 2018-06-03 DIAGNOSIS — L570 Actinic keratosis: Secondary | ICD-10-CM | POA: Diagnosis not present

## 2018-06-03 DIAGNOSIS — D1801 Hemangioma of skin and subcutaneous tissue: Secondary | ICD-10-CM | POA: Diagnosis not present

## 2018-06-03 DIAGNOSIS — L603 Nail dystrophy: Secondary | ICD-10-CM | POA: Diagnosis not present

## 2018-06-03 DIAGNOSIS — L821 Other seborrheic keratosis: Secondary | ICD-10-CM | POA: Diagnosis not present

## 2018-06-03 DIAGNOSIS — L2089 Other atopic dermatitis: Secondary | ICD-10-CM | POA: Diagnosis not present

## 2018-06-03 DIAGNOSIS — D225 Melanocytic nevi of trunk: Secondary | ICD-10-CM | POA: Diagnosis not present

## 2018-06-03 DIAGNOSIS — L820 Inflamed seborrheic keratosis: Secondary | ICD-10-CM | POA: Diagnosis not present

## 2018-06-10 DIAGNOSIS — Z6827 Body mass index (BMI) 27.0-27.9, adult: Secondary | ICD-10-CM | POA: Diagnosis not present

## 2018-06-10 DIAGNOSIS — I10 Essential (primary) hypertension: Secondary | ICD-10-CM | POA: Diagnosis not present

## 2018-06-10 DIAGNOSIS — N6313 Unspecified lump in the right breast, lower outer quadrant: Secondary | ICD-10-CM | POA: Diagnosis not present

## 2018-06-17 ENCOUNTER — Encounter: Payer: Medicare PPO | Admitting: Women's Health

## 2018-06-30 ENCOUNTER — Ambulatory Visit: Payer: Medicare PPO | Admitting: Women's Health

## 2018-06-30 ENCOUNTER — Encounter: Payer: Self-pay | Admitting: Women's Health

## 2018-06-30 VITALS — BP 120/78 | Ht 62.0 in | Wt 156.0 lb

## 2018-06-30 DIAGNOSIS — Z01419 Encounter for gynecological examination (general) (routine) without abnormal findings: Secondary | ICD-10-CM | POA: Diagnosis not present

## 2018-06-30 NOTE — Progress Notes (Signed)
Natalie [name] 05-17-1945 [id number]    History:    Presents for breast and pelvic exam.  1982 TAH with BSO for DU B on Estrace 0.25 every other day and having minimal hot flushes.  In the past when trying to decrease or stop HRT had numerous hot flushes and poor sleep.  Normal Pap and mammogram history.  2021 benign colon polyp was told does not need to have any further testing.  Current vaccines but has not had the new Shingrix.  Primary care manages hypertension, hyper cholesteremia.  Has annual skin checks with dermatologist benign biopsies.  2017 T score -1.9 at left femoral neck hip average -1.1 spine normal, FRAX 12% / 2.6%,  Stable.   Past medical history, past surgical history, family history and social history were all reviewed and documented in the EPIC chart.  Occasional intercourse.  Husband history of MI doing better.  2 children and 2 grandchildren.  ROS:  A ROS was performed and pertinent positives and negatives are included.  Exam:  Vitals:   06/30/18 1155  BP: 120/78  Weight: 156 lb (70.8 kg)  Height: 5\' 2"  (1.575 m)   Body mass index is 28.53 kg/m.   General appearance:  Normal Thyroid:  Symmetrical, normal in size, without palpable masses or nodularity. Respiratory  Auscultation:  Clear without wheezing or rhonchi Cardiovascular  Auscultation:  Regular rate, without rubs, murmurs or gallops  Edema/varicosities:  Not grossly evident Abdominal  Soft,nontender, without masses, guarding or rebound.  Liver/spleen:  No organomegaly noted  Hernia:  None appreciated  Skin  Inspection:  Grossly normal   Breasts: Examined lying and sitting.     Right: Without masses, retractions, discharge or axillary adenopathy.     Left: Without masses, retractions, discharge or axillary adenopathy. Gentitourinary   Inguinal/mons:  Normal without inguinal adenopathy  External genitalia:  Normal  BUS/Urethra/Skene's glands:  Normal  Vagina: Atrophic  Cervix: And uterus  absent  Adnexa/parametria:     Rt: Without masses or tenderness.   Lt: Without masses or tenderness.  Anus and perineum: Normal  Digital rectal exam: Normal sphincter tone without palpated masses or tenderness  Assessment/Plan:  73 y.o. MWF G4, P2 for breast and pelvic exam without complaint.  1982 TAH with BSO for DU B on Estrace 0.25 every other day  Hypertension, hypercholesteremia-primary care manages labs and meds Osteopenia without elevated FRAX  Plan: Shingrex reviewed and encouraged.  Has gained 20 pounds in the past year, reviewed need to decrease calorie/carbs, increase cardio type exercise, calcium rich foods, vitamin D 2000 daily encouraged.  Home safety, fall prevention and encouraged balance type exercise such as yoga.  Will finish Estrace 0.25 every other day and then stop.  Instructed to call if increased hot flushes and poor sleep.  Aware of risk for blood clots, strokes and breast cancer with HRT.    [place], 12:58 PM 06/30/2018

## 2018-06-30 NOTE — Patient Instructions (Signed)
shingrex  Shingles vaccine  Health Maintenance After Age 75 After age 75, you are at a higher risk for certain long-term diseases and infections as well as injuries from falls. Falls are a major cause of broken bones and head injuries in people who are older than age 75. Getting regular preventive care can help to keep you healthy and well. Preventive care includes getting regular testing and making lifestyle changes as recommended by your health care provider. Talk with your health care provider about:  Which screenings and tests you should have. A screening is a test that checks for a disease when you have no symptoms.  A diet and exercise plan that is right for you. What should I know about screenings and tests to prevent falls? Screening and testing are the best ways to find a health problem early. Early diagnosis and treatment give you the best chance of managing medical conditions that are common after age 75. Certain conditions and lifestyle choices may make you more likely to have a fall. Your health care provider may recommend:  Regular vision checks. Poor vision and conditions such as cataracts can make you more likely to have a fall. If you wear glasses, make sure to get your prescription updated if your vision changes.  Medicine review. Work with your health care provider to regularly review all of the medicines you are taking, including over-the-counter medicines. Ask your health care provider about any side effects that may make you more likely to have a fall. Tell your health care provider if any medicines that you take make you feel dizzy or sleepy.  Osteoporosis screening. Osteoporosis is a condition that causes the bones to get weaker. This can make the bones weak and cause them to break more easily.  Blood pressure screening. Blood pressure changes and medicines to control blood pressure can make you feel dizzy.  Strength and balance checks. Your health care provider may  recommend certain tests to check your strength and balance while standing, walking, or changing positions.  Foot health exam. Foot pain and numbness, as well as not wearing proper footwear, can make you more likely to have a fall.  Depression screening. You may be more likely to have a fall if you have a fear of falling, feel emotionally low, or feel unable to do activities that you used to do.  Alcohol use screening. Using too much alcohol can affect your balance and may make you more likely to have a fall. What actions can I take to lower my risk of falls? General instructions  Talk with your health care provider about your risks for falling. Tell your health care provider if: ? You fall. Be sure to tell your health care provider about all falls, even ones that seem minor. ? You feel dizzy, sleepy, or off-balance.  Take over-the-counter and prescription medicines only as told by your health care provider. These include any supplements.  Eat a healthy diet and maintain a healthy weight. A healthy diet includes low-fat dairy products, low-fat (lean) meats, and fiber from whole grains, beans, and lots of fruits and vegetables. Home safety  Remove any tripping hazards, such as rugs, cords, and clutter.  Install safety equipment such as grab bars in bathrooms and safety rails on stairs.  Keep rooms and walkways well-lit. Activity   Follow a regular exercise program to stay fit. This will help you maintain your balance. Ask your health care provider what types of exercise are appropriate for you.  If   you need a cane or walker, use it as recommended by your health care provider.  Wear supportive shoes that have nonskid soles. Lifestyle  Do not drink alcohol if your health care provider tells you not to drink.  If you drink alcohol, limit how much you have: ? 0-1 drink a day for women. ? 0-2 drinks a day for men.  Be aware of how much alcohol is in your drink. In the [place] one drink  equals one typical bottle of beer (12 oz), one-half glass of wine (5 oz), or one shot of hard liquor (1 oz).  Do not use any products that contain nicotine or tobacco, such as cigarettes and e-cigarettes. If you need help quitting, ask your health care provider. Summary  Having a healthy lifestyle and getting preventive care can help to protect your health and wellness after age 75.  Screening and testing are the best way to find a health problem early and help you avoid having a fall. Early diagnosis and treatment give you the best chance for managing medical conditions that are more common for people who are older than age 75.  Falls are a major cause of broken bones and head injuries in people who are older than age 75. Take precautions to prevent a fall at home.  Work with your health care provider to learn what changes you can make to improve your health and wellness and to prevent falls. This information is not intended to replace advice given to you by your health care provider. Make sure you discuss any questions you have with your health care provider. Document Released: 02/19/2017 Document Revised: 02/19/2017 Document Reviewed: 02/19/2017 Elsevier Interactive Patient Education  2019 [employer]  

## 2018-07-03 ENCOUNTER — Telehealth: Payer: Self-pay | Admitting: *Deleted

## 2018-07-03 NOTE — Telephone Encounter (Signed)
"  I need to talk to someone about having some surgery that I already talked to Dr. Jacqualyn Posey about a little while back.  Give me a call back."

## 2018-07-07 NOTE — Telephone Encounter (Signed)
I am returning your call.  You want to discuss scheduling surgery?  "Yes, but I think I want to hold off until all this mess subsides."  You will need to schedule an appointment to see Dr. Jacqualyn Posey prior to scheduling your surgery.  "Will I have to get authorization through my insurance for my surgery?"  What insurance do you have?  "I have [employer]."  [employer] does require prior authorization for surgeries.  We we will obtain the authorization for you.  "I'll call back to schedule an appointment.  Will I have a hard time getting an appointment?"  No, his schedule is flexible.

## 2018-08-27 ENCOUNTER — Other Ambulatory Visit: Payer: Self-pay | Admitting: Internal Medicine

## 2018-08-27 DIAGNOSIS — Z1231 Encounter for screening mammogram for malignant neoplasm of breast: Secondary | ICD-10-CM

## 2018-10-16 ENCOUNTER — Ambulatory Visit: Payer: Medicare PPO | Admitting: Podiatry

## 2018-10-16 ENCOUNTER — Encounter: Payer: Self-pay | Admitting: Podiatry

## 2018-10-16 ENCOUNTER — Other Ambulatory Visit: Payer: Self-pay

## 2018-10-16 DIAGNOSIS — L84 Corns and callosities: Secondary | ICD-10-CM

## 2018-10-16 DIAGNOSIS — M2041 Other hammer toe(s) (acquired), right foot: Secondary | ICD-10-CM

## 2018-10-16 NOTE — Patient Instructions (Signed)
Pre-Operative Instructions  Congratulations, you have decided to take an important step towards improving your quality of life.  You can be assured that the doctors and staff at [facility] will be with you every step of the way.  Here are some important things you should know:  1. Plan to be at the surgery center/hospital at least 1 (one) hour prior to your scheduled time, unless otherwise directed by the surgical center/hospital staff.  You must have a responsible adult accompany you, remain during the surgery and drive you home.  Make sure you have directions to the surgical center/hospital to ensure you arrive on time. 2. If you are having surgery at [facility] or [facility], you will need a copy of your medical history and physical form from your family physician within one month prior to the date of surgery. We will give you a form for your primary physician to complete.  3. We make every effort to accommodate the date you request for surgery.  However, there are times where surgery dates or times have to be moved.  We will contact you as soon as possible if a change in schedule is required.   4. No aspirin/ibuprofen for one week before surgery.  If you are on aspirin, any non-steroidal anti-inflammatory medications (Mobic, Aleve, Ibuprofen) should not be taken seven (7) days prior to your surgery.  You make take Tylenol for pain prior to surgery.  5. Medications - If you are taking daily heart and blood pressure medications, seizure, reflux, allergy, asthma, anxiety, pain or diabetes medications, make sure you notify the surgery center/hospital before the day of surgery so they can tell you which medications you should take or avoid the day of surgery. 6. No food or drink after midnight the night before surgery unless directed otherwise by surgical center/hospital staff. 7. No alcoholic beverages 24-hours prior to surgery.  No smoking 24-hours prior or 24-hours after  surgery. 8. Wear loose pants or shorts. They should be loose enough to fit over bandages, boots, and casts. 9. Don't wear slip-on shoes. Sneakers are preferred. 10. Bring your boot with you to the surgery center/hospital.  Also bring crutches or a walker if your physician has prescribed it for you.  If you do not have this equipment, it will be provided for you after surgery. 11. If you have not been contacted by the surgery center/hospital by the day before your surgery, call to confirm the date and time of your surgery. 12. Leave-time from work may vary depending on the type of surgery you have.  Appropriate arrangements should be made prior to surgery with your employer. 13. Prescriptions will be provided immediately following surgery by your doctor.  Fill these as soon as possible after surgery and take the medication as directed. Pain medications will not be refilled on weekends and must be approved by the doctor. 14. Remove nail polish on the operative foot and avoid getting pedicures prior to surgery. 15. Wash the night before surgery.  The night before surgery wash the foot and leg well with water and the antibacterial soap provided. Be sure to pay special attention to beneath the toenails and in between the toes.  Wash for at least three (3) minutes. Rinse thoroughly with water and dry well with a towel.  Perform this wash unless told not to do so by your physician.  Enclosed: 1 Ice pack (please put in freezer the night before surgery)   1 Hibiclens skin cleaner     Pre-op instructions  If you have any questions regarding the instructions, please do not hesitate to call our office.  [place]: [address], [place], [place] [address] -- [phone]  [place]: [address] [place], [place] [address] -- [phone]  [place]: [address]  [place], [place] [address] -- [phone]  [place]: [address], [address], [place], [place] [address] -- [phone]  Website:  [email or web address] 

## 2018-10-18 NOTE — Progress Notes (Signed)
Subjective: 73 year old female presents the office today for surgical consultation given painful hammertoes to both of her second toes.  This is been an ongoing issue for her and she wants to go to proceed with surgical intervention.  She is tried conservative care including, but not limited to, shoe modifications, offloading padding but any significant resolution.  She is also asking to trim the calluses ball of her foot today.  She tried to do it herself however she because of the bleed on the left side. Denies any systemic complaints such as fevers, chills, nausea, vomiting. No acute changes since last appointment, and no other complaints at this time.   Objective: AAO x3, NAD DP/PT pulses palpable bilaterally, CRT less than 3 seconds Rigid hammertoe contractures present to bilateral second toes with mild erythema of the dorsal IPJ from irritation site shoes.  This is where she gets tenderness with pressure.  No open lesions identified.  There is hyperkeratotic tissue diffusely submetatarsal 2 through 4 on the right side and minimally on the left side.  There is a scab on the left side, she trimmed it to deeper self.  No surrounding erythema, ascending cellulitis.  No signs of infection. No open lesions or pre-ulcerative lesions.  No pain with calf compression, swelling, warmth, erythema  Assessment: Symptomatic hammertoe deformity bilateral second toes, callus formation bilateral  Plan: -All treatment options discussed with the patient including all alternatives, risks, complications.  -Reviewed the x-rays with her.  Discussed with conservative as well as surgical care.  As long she was to proceed with surgical intervention. -We will plan for bilateral second PIPJ arthrodesis with K wire fixation. -The incision placement as well as the postoperative course was discussed with the patient. I discussed risks of the surgery which include, but not limited to, infection, bleeding, pain, swelling, need  for further surgery, delayed or nonhealing, painful or ugly scar, numbness or sensation changes, over/under correction, recurrence, transfer lesions, further deformity, hardware failure, DVT/PE, loss of toe/foot. Patient understands these risks and wishes to proceed with surgery. The surgical consent was reviewed with the patient all 3 pages were signed. No promises or guarantees were given to the outcome of the procedure. All questions were answered to the best of my ability. Before the surgery the patient was encouraged to call the office if there is any further questions. The surgery will be performed at the [facility] on an outpatient basis. -Debrided hyperkeratotic lesions bilaterally without any complications or bleeding. -Order arterial duplex to ensure healing prior to surgery. -Patient encouraged to call the office with any questions, concerns, change in symptoms.   Trula Slade DPM

## 2018-10-19 ENCOUNTER — Other Ambulatory Visit: Payer: Self-pay | Admitting: Podiatry

## 2018-10-19 ENCOUNTER — Other Ambulatory Visit: Payer: Self-pay | Admitting: *Deleted

## 2018-10-19 DIAGNOSIS — R0989 Other specified symptoms and signs involving the circulatory and respiratory systems: Secondary | ICD-10-CM

## 2018-10-20 ENCOUNTER — Telehealth: Payer: Self-pay | Admitting: *Deleted

## 2018-10-20 NOTE — Telephone Encounter (Signed)
"  I'm scheduled to have surgery at the end of the month.  I was told I needed to register with the surgery center.  I don't have any information on how to do that."  We gave you a brochure in your grey bag, the instructions are in it.  "I have that."  Once you open the brochure, there's a dark blue page.  It says [employer].  "Okay, I see.  Thank you."

## 2018-10-21 ENCOUNTER — Ambulatory Visit
Admission: RE | Admit: 2018-10-21 | Discharge: 2018-10-21 | Disposition: A | Discharge status: Home / Self Care | Payer: Medicare PPO | Source: Ambulatory Visit | Attending: Internal Medicine | Admitting: Internal Medicine

## 2018-10-21 ENCOUNTER — Other Ambulatory Visit: Payer: Self-pay

## 2018-10-21 DIAGNOSIS — Z1231 Encounter for screening mammogram for malignant neoplasm of breast: Secondary | ICD-10-CM

## 2018-10-22 ENCOUNTER — Other Ambulatory Visit: Payer: Self-pay | Admitting: Podiatry

## 2018-10-22 DIAGNOSIS — R0989 Other specified symptoms and signs involving the circulatory and respiratory systems: Secondary | ICD-10-CM

## 2018-10-30 ENCOUNTER — Ambulatory Visit (HOSPITAL_COMMUNITY)
Admission: RE | Admit: 2018-10-30 | Discharge: 2018-10-30 | Disposition: A | Discharge status: Home / Self Care | Payer: Medicare PPO | Source: Ambulatory Visit | Attending: Internal Medicine | Admitting: Internal Medicine

## 2018-10-30 ENCOUNTER — Other Ambulatory Visit: Payer: Self-pay

## 2018-10-30 DIAGNOSIS — R0989 Other specified symptoms and signs involving the circulatory and respiratory systems: Secondary | ICD-10-CM

## 2018-11-02 ENCOUNTER — Telehealth: Payer: Self-pay | Admitting: *Deleted

## 2018-11-02 NOTE — Telephone Encounter (Signed)
I informed pt of Dr. Leigh Aurora review of results and statement.

## 2018-11-02 NOTE — Telephone Encounter (Signed)
-----   Message from Trula Slade, DPM sent at 11/02/2018  7:05 AM EDT ----- Val- please let her know that the circulation test is normal and will proceed with surgery as planned. Thanks.

## 2018-11-13 ENCOUNTER — Telehealth: Payer: Self-pay | Admitting: *Deleted

## 2018-11-13 NOTE — Telephone Encounter (Signed)
"  I'm scheduled for surgery on Wednesday.  I have already filled out everything with the surgery center.  I'm scheduled to come in there on Monday.  Is there anything else I need to do?"  You do not need to do anything else for [place].

## 2018-11-16 ENCOUNTER — Ambulatory Visit (INDEPENDENT_AMBULATORY_CARE_PROVIDER_SITE_OTHER): Payer: Medicare PPO

## 2018-11-16 ENCOUNTER — Other Ambulatory Visit: Payer: Self-pay | Admitting: Podiatry

## 2018-11-16 ENCOUNTER — Ambulatory Visit: Payer: Medicare PPO | Admitting: Podiatry

## 2018-11-16 ENCOUNTER — Other Ambulatory Visit: Payer: Self-pay

## 2018-11-16 ENCOUNTER — Telehealth: Payer: Self-pay | Admitting: *Deleted

## 2018-11-16 VITALS — Temp 98.1°F

## 2018-11-16 DIAGNOSIS — M2041 Other hammer toe(s) (acquired), right foot: Secondary | ICD-10-CM | POA: Diagnosis not present

## 2018-11-16 DIAGNOSIS — M216X2 Other acquired deformities of left foot: Secondary | ICD-10-CM | POA: Diagnosis not present

## 2018-11-16 DIAGNOSIS — M7742 Metatarsalgia, left foot: Secondary | ICD-10-CM

## 2018-11-16 DIAGNOSIS — M7741 Metatarsalgia, right foot: Secondary | ICD-10-CM | POA: Diagnosis not present

## 2018-11-16 DIAGNOSIS — L84 Corns and callosities: Secondary | ICD-10-CM

## 2018-11-16 DIAGNOSIS — M79671 Pain in right foot: Secondary | ICD-10-CM

## 2018-11-16 DIAGNOSIS — M79672 Pain in left foot: Secondary | ICD-10-CM

## 2018-11-16 MED ORDER — CLINDAMYCIN HCL 300 MG PO CAPS: 300.0000 mg | ORAL_CAPSULE | Freq: Three times a day (TID) | ORAL | 0 refills | Status: DC

## 2018-11-16 MED ORDER — PROMETHAZINE HCL 25 MG PO TABS: 25.0000 mg | ORAL_TABLET | Freq: Three times a day (TID) | ORAL | 0 refills | Status: DC | PRN

## 2018-11-16 MED ORDER — HYDROCODONE-ACETAMINOPHEN 5-325 MG PO TABS: 1.0000 | ORAL_TABLET | Freq: Four times a day (QID) | ORAL | 0 refills | Status: DC | PRN

## 2018-11-16 NOTE — Telephone Encounter (Signed)
[employer] sent an authorization for the surgery.  However, it was only approved for one unit.  The authorization number is [id number] and it is valid from 11/18/2018 to 01/02/2019.   I am calling in regards to an authorization I received for a patient.  Only one unit was authorized.  "We do not authorize based upon the units.  I see for this one the [id number] was authorized for bilateral foot."  I just wanted to make sure.

## 2018-11-16 NOTE — Telephone Encounter (Signed)
DOS 11/18/2018; HAMMER TOE REPAIR 2ND B/L FOOT - [id number]  [employer]:  I sent a authorization request form for the surgery to [place] along with clinical notes.

## 2018-11-16 NOTE — Progress Notes (Signed)
Subjective: 73 year old female presents the office today for concerns of calluses of the balls of her feet.  She is scheduled for hammertoe surgery on both feet on Wednesday but she went to see if there was something we can dofor the calluses during surgery.  This is been a chronic issue for her for quite some time.  She has tried shoe modifications with a significant improvement.  Also she states right big toenail did come off partially and she is been using antifungal medicine.  She has a fungus of the causing nail to come off.  She says it still does not look 100% but denies any pain, redness or drainage or any signs of infection. Denies any systemic complaints such as fevers, chills, nausea, vomiting. No acute changes since last appointment, and no other complaints at this time.   Objective: AAO x3, NAD DP/PT pulses palpable bilaterally, CRT less than 3 seconds Hammertoe contractures present bilateral second toes.  There is prominent metatarsal heads plantarly with atrophy today and there is mild diffuse calluses submetatarsal 2 through 4 on the right side worse than left.  No ongoing ulceration drainage or signs of infection.  No edema, erythema. The right hallux toenail is mildly hypertrophic, dystrophic with yellow discoloration worse than the left. No open lesions or pre-ulcerative lesions.  No pain with calf compression, swelling, warmth, erythema  Assessment: Hammertoe deformity with prominent metatarsal heads  Plan: -All treatment options discussed with the patient including all alternatives, risks, complications.  -X-rays were obtained and reviewed.  Hammertoe contractures present.  No evidence of acute fracture -We discussed in detail further surgical intervention including metatarsal osteotomies.  However for short and second metatarsal needed shortening 2, 3, 4.  We did this the first metatarsal would be long and not sure that this is a negative result but she wants given significant  atrophy of the fat pad.  After discussion regards to risks she wants to hold off on that.  We will plan on schedule foot hammertoe repair as well as adding callus debridement.  Again discussed the surgery as well as postoperative course.  She wishes to proceed.  Postoperative medications were sent to her pharmacy. -Order fungus medicine through [facility] -Patient encouraged to call the office with any questions, concerns, change in symptoms.   Trula Slade DPM

## 2018-11-18 ENCOUNTER — Encounter: Payer: Self-pay | Admitting: Podiatry

## 2018-11-18 DIAGNOSIS — M2042 Other hammer toe(s) (acquired), left foot: Secondary | ICD-10-CM | POA: Diagnosis not present

## 2018-11-18 DIAGNOSIS — M7752 Other enthesopathy of left foot: Secondary | ICD-10-CM | POA: Diagnosis not present

## 2018-11-18 DIAGNOSIS — I10 Essential (primary) hypertension: Secondary | ICD-10-CM | POA: Diagnosis not present

## 2018-11-18 DIAGNOSIS — M79671 Pain in right foot: Secondary | ICD-10-CM | POA: Diagnosis not present

## 2018-11-18 DIAGNOSIS — M2040 Other hammer toe(s) (acquired), unspecified foot: Secondary | ICD-10-CM | POA: Diagnosis not present

## 2018-11-18 DIAGNOSIS — M2041 Other hammer toe(s) (acquired), right foot: Secondary | ICD-10-CM | POA: Diagnosis not present

## 2018-11-18 DIAGNOSIS — M24574 Contracture, right foot: Secondary | ICD-10-CM | POA: Diagnosis not present

## 2018-11-18 DIAGNOSIS — M79672 Pain in left foot: Secondary | ICD-10-CM | POA: Diagnosis not present

## 2018-11-20 ENCOUNTER — Telehealth: Payer: Self-pay | Admitting: *Deleted

## 2018-11-20 NOTE — Telephone Encounter (Signed)
Called and spoke with the patient and patient is fine just a little pain and there is no fever or chills and no nausea and the ice helps and I have been staying off of it and only 15 minutes that I have been getting up and I have had to take no pain medicine and patient stated that could she take the ace bandage off and I stated that she could and just re-wrap lightly not too tight and patient stated that she was taken tylenol and ibuprofen and I stated to call the office if any concerns or questions. Lattie Haw

## 2018-11-24 ENCOUNTER — Encounter: Payer: Self-pay | Admitting: Podiatry

## 2018-11-24 ENCOUNTER — Ambulatory Visit (INDEPENDENT_AMBULATORY_CARE_PROVIDER_SITE_OTHER): Payer: Self-pay | Admitting: Podiatry

## 2018-11-24 ENCOUNTER — Ambulatory Visit (INDEPENDENT_AMBULATORY_CARE_PROVIDER_SITE_OTHER): Payer: Medicare PPO

## 2018-11-24 ENCOUNTER — Other Ambulatory Visit: Payer: Self-pay

## 2018-11-24 VITALS — Temp 97.2°F

## 2018-11-24 DIAGNOSIS — Z09 Encounter for follow-up examination after completed treatment for conditions other than malignant neoplasm: Secondary | ICD-10-CM

## 2018-11-24 DIAGNOSIS — M216X2 Other acquired deformities of left foot: Secondary | ICD-10-CM | POA: Diagnosis not present

## 2018-11-24 DIAGNOSIS — M2031 Hallux varus (acquired), right foot: Secondary | ICD-10-CM

## 2018-11-24 DIAGNOSIS — M2032 Hallux varus (acquired), left foot: Secondary | ICD-10-CM

## 2018-12-03 NOTE — Progress Notes (Signed)
Subjective: Natalie [name] is a 72 y.o. is seen today in office s/p bilateral second digit hammertoe repair preformed on 11/18/2018.  She is doing well and her pain is controlled.  Treatment with surgical shoes.  Denies any systemic complaints such as fevers, chills, nausea, vomiting. No calf pain, chest pain, shortness of breath.   Objective: General: No acute distress, AAOx3  DP/PT pulses palpable 2/4, CRT < 3 sec to all digits.  Protective sensation intact. Motor function intact.  Bilateral feet: Incision is well coapted without any evidence of dehiscence and sutures are intact as well as the K wire intact the distal digits. There is no surrounding erythema, ascending cellulitis, fluctuance, crepitus, malodor, drainage/purulence. There is minimal edema around the surgical site. There is no pain along the surgical site.  Toes are nice position No other areas of tenderness to bilateral lower extremities.  No other open lesions or pre-ulcerative lesions.  No pain with calf compression, swelling, warmth, erythema.   Assessment and Plan:  Status post bilateral second digit hammertoe repair, doing well with no complications   -Treatment options discussed including all alternatives, risks, and complications -X-rays were obtained reviewed.  Status post hammertoe repair with K wires intact.  No evidence of acute fracture. -Antibiotic ointment and dressing was applied.  Keep the dressing clean, dry, intact. -Ice/elevation -Pain medication as needed. -Monitor for any clinical signs or symptoms of infection and DVT/PE and directed to call the office immediately should any occur or go to the ER. -Follow-up as scheduled for possible suture removal.  Or sooner if any problems arise. In the meantime, encouraged to call the office with any questions, concerns, change in symptoms.   Celesta Gentile, DPM

## 2018-12-04 ENCOUNTER — Other Ambulatory Visit: Payer: Self-pay

## 2018-12-04 ENCOUNTER — Ambulatory Visit (INDEPENDENT_AMBULATORY_CARE_PROVIDER_SITE_OTHER): Payer: Medicare PPO | Admitting: Podiatry

## 2018-12-04 VITALS — Temp 98.0°F

## 2018-12-04 DIAGNOSIS — Z09 Encounter for follow-up examination after completed treatment for conditions other than malignant neoplasm: Secondary | ICD-10-CM

## 2018-12-04 DIAGNOSIS — M2031 Hallux varus (acquired), right foot: Secondary | ICD-10-CM

## 2018-12-04 DIAGNOSIS — M2032 Hallux varus (acquired), left foot: Secondary | ICD-10-CM

## 2018-12-04 NOTE — Progress Notes (Signed)
Subjective:   Patient ID: Natalie [name], female   DOB: 73 y.o.   MRN: [id number]   HPI Patient states doing well after digital fusion of the second toes both feet stating she is having minimal discomfort and wearing her surgical shoes as recommended   ROS      Objective:  Physical Exam  Neurovascular status intact negative Homans sign noted with wound edges well coapted stitches intact digit to both feet with pins in place with the ball having fallen off second pin but not giving her any problems     Assessment:  Patient is doing well post digital fusion digit to both feet with good alignment noted     Plan:  H&P reviewed continued immobilization reapplied sterile dressing and stitch removal in 1 week.  Gave instructions for continued elevation and encouraged her to call with any questions concerns she may half

## 2018-12-11 ENCOUNTER — Other Ambulatory Visit: Payer: Self-pay

## 2018-12-11 ENCOUNTER — Encounter: Payer: Self-pay | Admitting: Podiatry

## 2018-12-11 ENCOUNTER — Ambulatory Visit (INDEPENDENT_AMBULATORY_CARE_PROVIDER_SITE_OTHER): Payer: Medicare PPO | Admitting: Podiatry

## 2018-12-11 VITALS — Temp 98.1°F

## 2018-12-11 DIAGNOSIS — M2041 Other hammer toe(s) (acquired), right foot: Secondary | ICD-10-CM

## 2018-12-11 DIAGNOSIS — M2042 Other hammer toe(s) (acquired), left foot: Secondary | ICD-10-CM

## 2018-12-11 DIAGNOSIS — Z09 Encounter for follow-up examination after completed treatment for conditions other than malignant neoplasm: Secondary | ICD-10-CM

## 2018-12-11 NOTE — Progress Notes (Signed)
Subjective: Natalie [name] is a 73 y.o. is seen today in office s/p bilateral second digit hammertoe repair preformed on 11/18/2018.  She presents here for suture removal.  Otherwise she states that she is doing well.  She has some good days and some bad days.  She is been wearing a surgical shoe that she is ready to get the K wires out.  She has no new concerns. Denies any systemic complaints such as fevers, chills, nausea, vomiting. No calf pain, chest pain, shortness of breath.   Objective: General: No acute distress, AAOx3  DP/PT pulses palpable 2/4, CRT < 3 sec to all digits.  Protective sensation intact. Motor function intact.  Bilateral feet: Incision is well coapted without any evidence of dehiscence and sutures are intact as well as the K wire intact the distal digits. There is no surrounding erythema, ascending cellulitis, fluctuance, crepitus, malodor, drainage/purulence. There is minimal edema around the surgical site. There is no pain along the surgical site.  Toes are in a rectus position No pain with calf compression, swelling, warmth, erythema.   Assessment and Plan:  Status post bilateral second digit hammertoe repair, doing well with no complications   -Treatment options discussed including all alternatives, risks, and complications -Sutures removed today and complication.  Incisions are healing well.  Antibiotic ointment and dressing applied.  Keep the dressing clean, dry, intact.  Remain in surgical shoes.  Ice elevation.  Pain medication as needed I will see her back in 2 weeks time repeat x-rays and likely pin removal.  Trula Slade DPM

## 2018-12-18 ENCOUNTER — Telehealth: Payer: Self-pay | Admitting: Podiatry

## 2018-12-18 NOTE — Telephone Encounter (Signed)
Pt had hammertoe surgery on 11/18/18 and states that she has wires coming out of her toes and feels like she still has stitches in her toes. Pt would like to discuss with the nurse. Please give patient a call.

## 2018-12-18 NOTE — Telephone Encounter (Signed)
I called pt and told her not to push the pin back in the left 2nd, but to cover the pin with a gauze and/or cover with a sock loosely to keep from being pulled out. I told to that in these cases sometimes the pin will come all the way out, if so then cover with a antibiotic and dressing and call our office but never push back into the toe. Pt states she also feels like there is a stitch in the two toes will that be okay. I told pt that can be taken care of on the new appt time, if the area should become red put a small amount of antibiotic ointment on the area and cover with a bandaid. Transferred pt to scheduler for an appt Monday.

## 2018-12-21 ENCOUNTER — Ambulatory Visit (INDEPENDENT_AMBULATORY_CARE_PROVIDER_SITE_OTHER): Payer: Medicare PPO

## 2018-12-21 ENCOUNTER — Ambulatory Visit (INDEPENDENT_AMBULATORY_CARE_PROVIDER_SITE_OTHER): Payer: Self-pay | Admitting: Podiatry

## 2018-12-21 ENCOUNTER — Other Ambulatory Visit: Payer: Self-pay

## 2018-12-21 VITALS — Temp 97.9°F

## 2018-12-21 DIAGNOSIS — M2042 Other hammer toe(s) (acquired), left foot: Secondary | ICD-10-CM

## 2018-12-21 DIAGNOSIS — M2041 Other hammer toe(s) (acquired), right foot: Secondary | ICD-10-CM

## 2018-12-21 NOTE — Progress Notes (Signed)
Subjective: Natalie [name] is a 73 y.o. is seen today in office s/p bilateral second digit hammertoe repair preformed on 11/18/2018. She presents today as a walk-in appointment as the wire on the left 2nd toe is coming out more.  She denies any recent injury to the area.  No increase in pain.  She is either requires. Denies any systemic complaints such as fevers, chills, nausea, vomiting. No calf pain, chest pain, shortness of breath.   Objective: General: No acute distress, AAOx3  DP/PT pulses palpable 2/4, CRT < 3 sec to all digits.  Protective sensation intact. Motor function intact.  Bilateral feet: Incision is well coapted without any evidence of dehiscence and sutures are intact as well as the K wire intact the distal digits. On the second toe on the right but the right has come out some.  The toe is still stable in rectus position.  There is mild edema of the toes there is no erythema or warmth.  No signs of infection.  Toes are in a rectus position No pain with calf compression, swelling, warmth, erythema.   Assessment and Plan:  Status post bilateral second digit hammertoe repair, doing well with no complications   -Treatment options discussed including all alternatives, risks, and complications -X-rays were obtained and reviewed.  Hardware still across the PIPJ on the right side.  No evidence of acute fracture. -Left the K wire intact.  Antibiotic ointment is applied to the distal toe.  Followed by dry sterile dressing. -Remain in surgical shoe, elevation. -Pain medication as needed but she has not been requiring this.  Follow-up as scheduled for likely pin removal.  Trula Slade DPM

## 2018-12-22 ENCOUNTER — Ambulatory Visit: Payer: Medicare PPO | Admitting: Podiatry

## 2018-12-25 ENCOUNTER — Ambulatory Visit: Payer: Medicare PPO | Admitting: Podiatry

## 2018-12-29 ENCOUNTER — Ambulatory Visit: Payer: Medicare PPO | Admitting: Podiatry

## 2019-01-05 ENCOUNTER — Ambulatory Visit (INDEPENDENT_AMBULATORY_CARE_PROVIDER_SITE_OTHER): Payer: Medicare PPO

## 2019-01-05 ENCOUNTER — Ambulatory Visit (INDEPENDENT_AMBULATORY_CARE_PROVIDER_SITE_OTHER): Payer: Self-pay | Admitting: Podiatry

## 2019-01-05 ENCOUNTER — Other Ambulatory Visit: Payer: Self-pay

## 2019-01-05 VITALS — Temp 98.1°F

## 2019-01-05 DIAGNOSIS — M2041 Other hammer toe(s) (acquired), right foot: Secondary | ICD-10-CM

## 2019-01-05 DIAGNOSIS — M2042 Other hammer toe(s) (acquired), left foot: Secondary | ICD-10-CM

## 2019-01-05 NOTE — Progress Notes (Signed)
Subjective: Natalie [name] is a 73 y.o. is seen today in office s/p bilateral second digit hammertoe repair preformed on 11/18/2018.  She presents today for likely K wire removal of the second toes.  She states that she is eagerly back into a regular shoe.  She not taking any pain medication.  She has no new concerns today. Denies any systemic complaints such as fevers, chills, nausea, vomiting. No calf pain, chest pain, shortness of breath.   Objective: General: No acute distress, AAOx3  DP/PT pulses palpable 2/4, CRT < 3 sec to all digits.  Protective sensation intact. Motor function intact.  Bilateral feet: Incision is well coapted without any evidence of dehiscence and sutures are intact as well as the K wire intact the distal digits.  The toes are in rectus position.  There is minimal edema to the toes there is no erythema or warmth or any clinical signs of infection. No pain with calf compression, swelling, warmth, erythema.   Assessment and Plan:  Status post bilateral second digit hammertoe repair, doing well with no complications   -Treatment options discussed including all alternatives, risks, and complications -X-rays were obtained and reviewed.  Hardware still across the PIPJ on the right side.  No evidence of acute fracture. -The skin was prepped today with Betadine.  The K wires removed today in total without complications.  Antibiotic ointment and a bandage was applied.  She can start to shower tomorrow as long as the site is healed.  She can start to slowly transition back to regular shoe.  I also showed her how to tape the toes to help prevent edema.  Trula Slade DPM

## 2019-01-08 ENCOUNTER — Ambulatory Visit: Payer: Medicare PPO | Admitting: Podiatry

## 2019-01-26 ENCOUNTER — Ambulatory Visit (INDEPENDENT_AMBULATORY_CARE_PROVIDER_SITE_OTHER): Payer: Medicare PPO

## 2019-01-26 ENCOUNTER — Other Ambulatory Visit: Payer: Self-pay

## 2019-01-26 ENCOUNTER — Ambulatory Visit (INDEPENDENT_AMBULATORY_CARE_PROVIDER_SITE_OTHER): Payer: Self-pay | Admitting: Podiatry

## 2019-01-26 DIAGNOSIS — M2041 Other hammer toe(s) (acquired), right foot: Secondary | ICD-10-CM | POA: Diagnosis not present

## 2019-01-26 DIAGNOSIS — M2042 Other hammer toe(s) (acquired), left foot: Secondary | ICD-10-CM

## 2019-01-27 NOTE — Progress Notes (Signed)
Subjective: Natalie [name] is a 73 y.o. is seen today in office s/p bilateral second digit hammertoe repair preformed on 11/18/2018.  She still reports doing well.  On the bottom of the right second toe she feels a fair something on the bottom of the toe at times.  She is wearing a regular shoe although a slide on.  She has not taken any pain medication.  Denies any systemic complaints such as fevers, chills, nausea, vomiting. No calf pain, chest pain, shortness of breath.   Objective: General: No acute distress, AAOx3  DP/PT pulses palpable 2/4, CRT < 3 sec to all digits.  Protective sensation intact. Motor function intact.  Bilateral feet: Incision is well coapted without any evidence of dehiscence and a scar is formed.  The toes are in rectus position.  There is some fatty tissue on the plantar aspect of the second digit on the right foot but does not appear to be bone.  This is more from excess skin on the procedure.  No pain with calf compression, swelling, warmth, erythema.   Assessment and Plan:  Status post bilateral second digit hammertoe repair, doing well with no complications   -Treatment options discussed including all alternatives, risks, and complications -X-rays obtained reviewed.  Status post second hammertoe repair. -I dispensed a toe regulator for the right second toe to see if this will be helpful.  Continue with supportive shoes.  She can also tape the toe to help with swelling.  Return in about 6 weeks (around 03/09/2019), or if symptoms worsen or fail to improve.  Trula Slade DPM

## 2019-01-28 ENCOUNTER — Encounter: Payer: Self-pay | Admitting: Gynecology

## 2019-02-23 DIAGNOSIS — R300 Dysuria: Secondary | ICD-10-CM | POA: Diagnosis not present

## 2019-02-24 DIAGNOSIS — Z23 Encounter for immunization: Secondary | ICD-10-CM | POA: Diagnosis not present

## 2019-02-24 DIAGNOSIS — K8020 Calculus of gallbladder without cholecystitis without obstruction: Secondary | ICD-10-CM | POA: Diagnosis not present

## 2019-02-24 DIAGNOSIS — M546 Pain in thoracic spine: Secondary | ICD-10-CM | POA: Diagnosis not present

## 2019-02-24 DIAGNOSIS — M25512 Pain in left shoulder: Secondary | ICD-10-CM | POA: Diagnosis not present

## 2019-03-09 ENCOUNTER — Other Ambulatory Visit: Payer: Self-pay

## 2019-03-09 ENCOUNTER — Ambulatory Visit (INDEPENDENT_AMBULATORY_CARE_PROVIDER_SITE_OTHER): Payer: Medicare PPO

## 2019-03-09 ENCOUNTER — Other Ambulatory Visit: Payer: Self-pay | Admitting: Podiatry

## 2019-03-09 ENCOUNTER — Ambulatory Visit (INDEPENDENT_AMBULATORY_CARE_PROVIDER_SITE_OTHER): Payer: Medicare PPO | Admitting: Podiatry

## 2019-03-09 DIAGNOSIS — M2041 Other hammer toe(s) (acquired), right foot: Secondary | ICD-10-CM

## 2019-03-09 DIAGNOSIS — M2042 Other hammer toe(s) (acquired), left foot: Secondary | ICD-10-CM

## 2019-03-09 DIAGNOSIS — M2450 Contracture, unspecified joint: Secondary | ICD-10-CM | POA: Diagnosis not present

## 2019-03-09 DIAGNOSIS — Q828 Other specified congenital malformations of skin: Secondary | ICD-10-CM

## 2019-03-09 DIAGNOSIS — M216X2 Other acquired deformities of left foot: Secondary | ICD-10-CM | POA: Diagnosis not present

## 2019-03-14 NOTE — Progress Notes (Signed)
Subjective: 73 year old female presents the office today for painful calluses to both balls of her feet.  Also she states that she still gets some discomfort of the second toes and she states the toes do not purchase the ground she stands.  She states they occasionally cause some rubbing.  Denies any recent injury.Denies any systemic complaints such as fevers, chills, nausea, vomiting. No acute changes since last appointment, and no other complaints at this time.   Objective: AAO x3, NAD DP/PT pulses palpable bilaterally, CRT less than 3 seconds Previous hammertoe repair of the second digits.  Upon weightbearing evaluation the second toes are sitting off the ground.  No significant tenderness palpation of the toes.  Hyperkeratotic lesion submetatarsal area with majority to the lateral aspect plantarly.  Upon debridement there is no underlying ulceration drainage or any signs of infection.  Prominent metatarsal heads plantarly with atrophy of the fat pad.  These calluses are the same cough as she had prior to surgery.  No pain with calf compression, swelling, warmth, erythema  Assessment: Second MPJ contracture bilaterally; porokeratosis  Plan: -All treatment options discussed with the patient including all alternatives, risks, complications.  -X-rays obtained reviewed.  Status post hammertoe repair.  No evidence of acute fracture. -Regards to the elevation we discussed doing a capsular release under local anesthesia in the office.  Discussed this is not a guarantee that the toes will sit flush with the ground.  Also the calluses were the same calluses that she had prior to surgery and I do not think that the second toe elevation is causing the calluses.  I debrided the calluses today without any complications or bleeding.  Discussed with the capsular release.  She will consider this. -Patient encouraged to call the office with any questions, concerns, change in symptoms.   Trula Slade  DPM

## 2019-04-02 IMAGING — MG DIGITAL DIAGNOSTIC UNILATERAL RIGHT MAMMOGRAM WITH TOMO AND CAD
6 series · 6 of 18 positions shown · non-contrast
Comparison: Previous exam(s).

CLINICAL DATA: 72-year-old female with a focal right breast
palpable lump with associated itching for 2 weeks.

EXAM:
DIGITAL DIAGNOSTIC RIGHT MAMMOGRAM WITH CAD AND TOMO
ULTRASOUND RIGHT BREAST

[R TAN synth-2D]
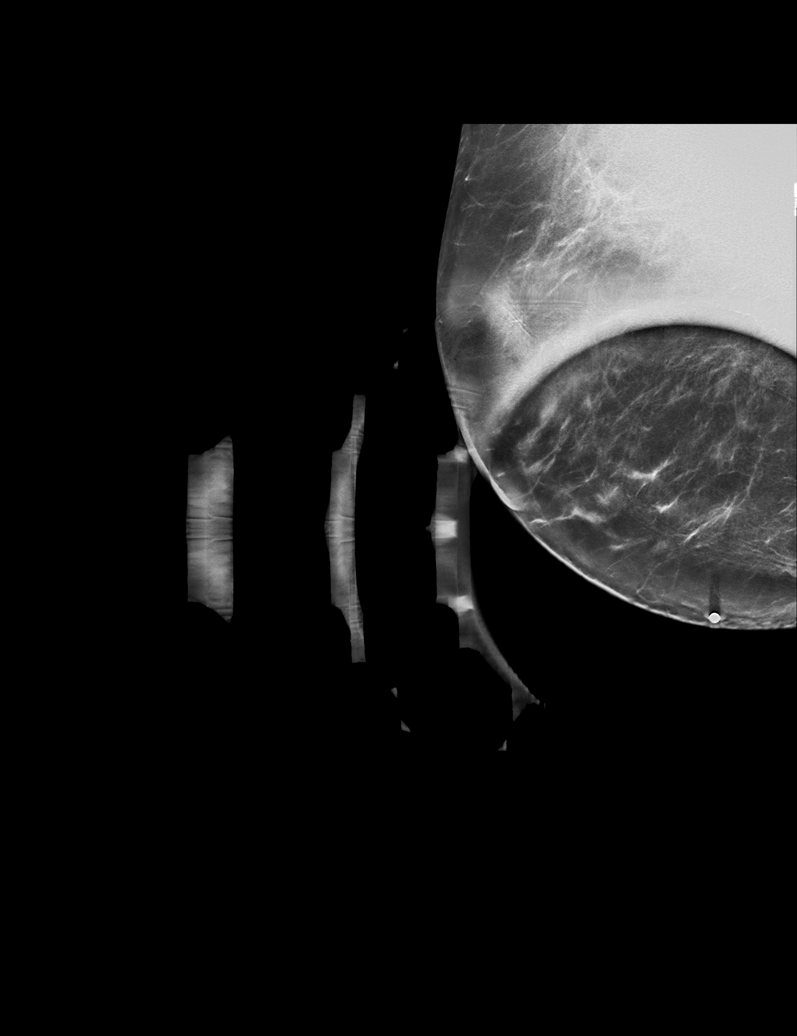

[R CC synth-2D]
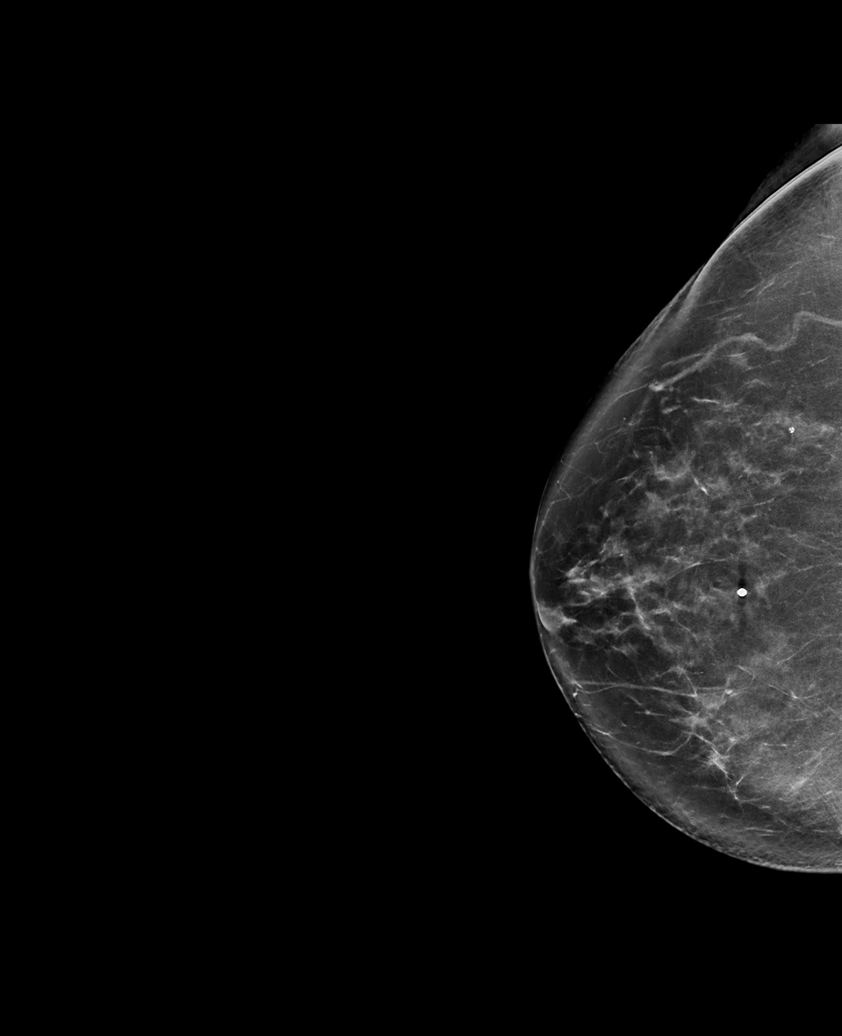

[R MLO synth-2D]
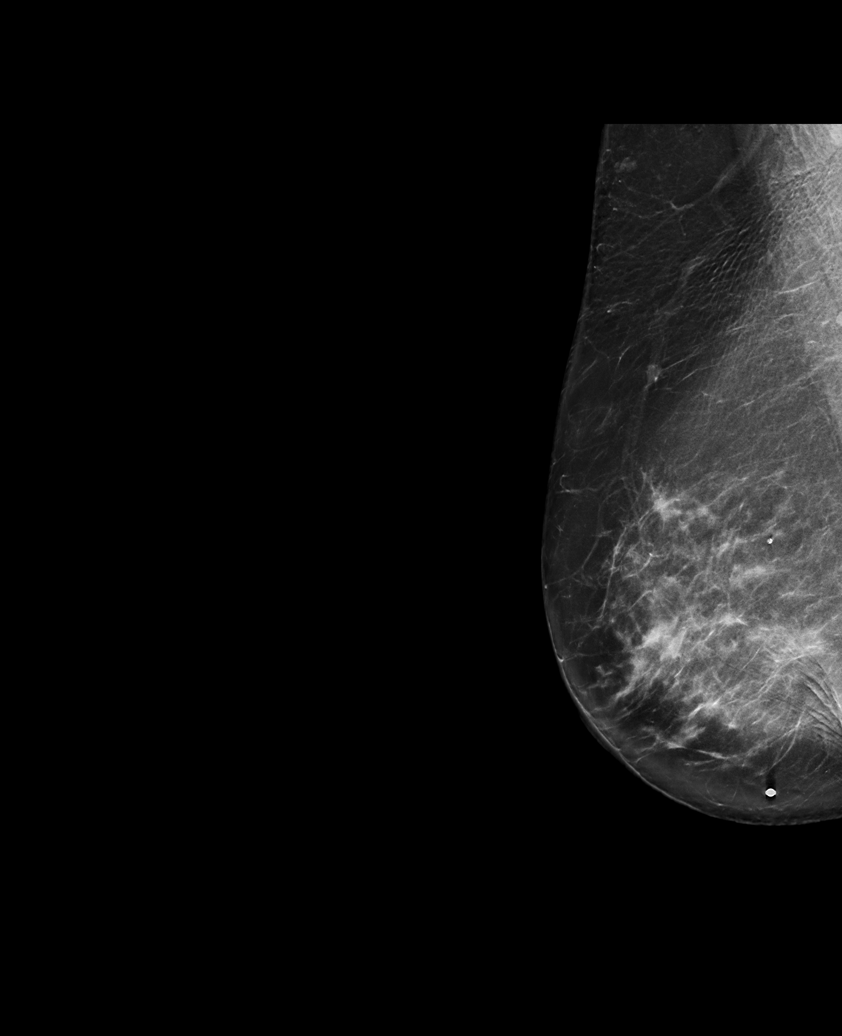

[R CC tomo · tomo slice 41/81.0]
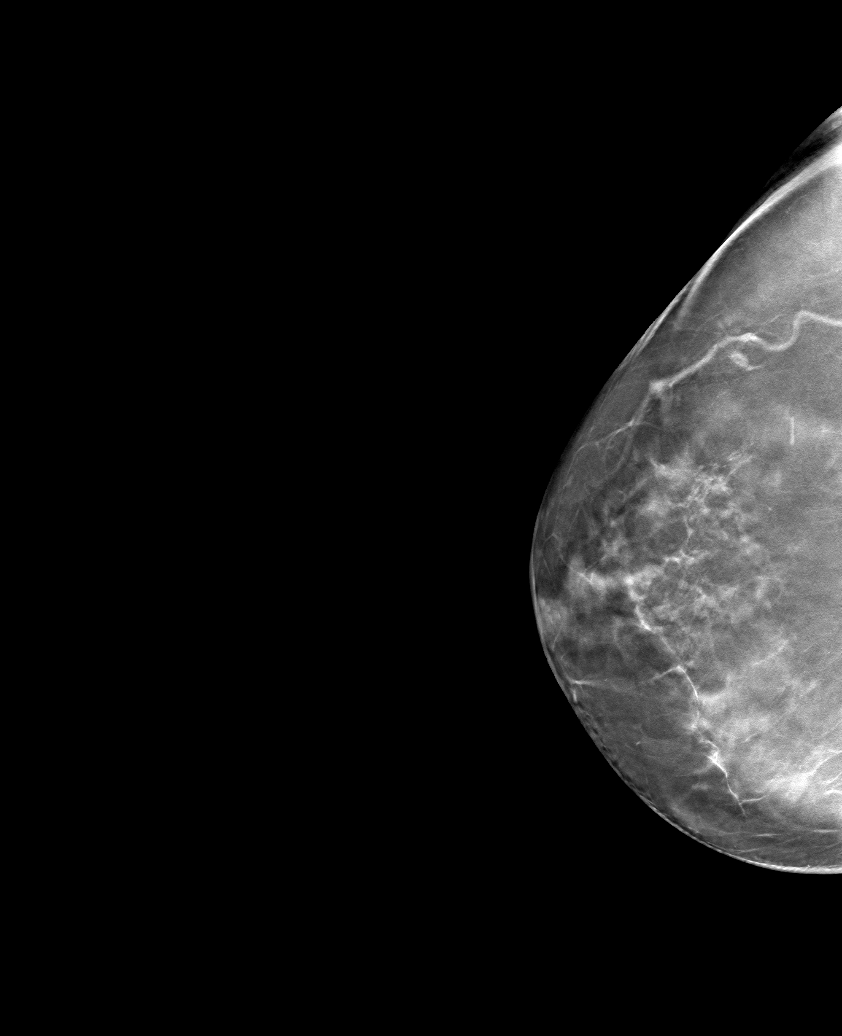

[R TAN tomo · tomo slice 33/66.0]
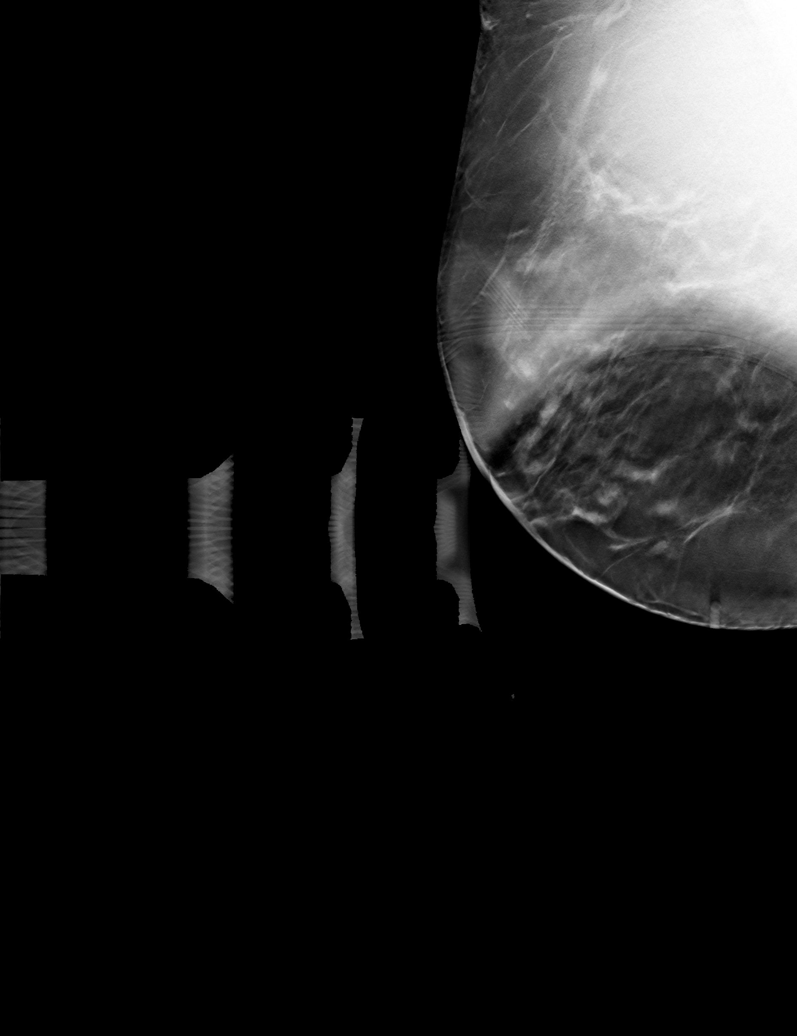

[R MLO tomo · tomo slice 44/87.0]
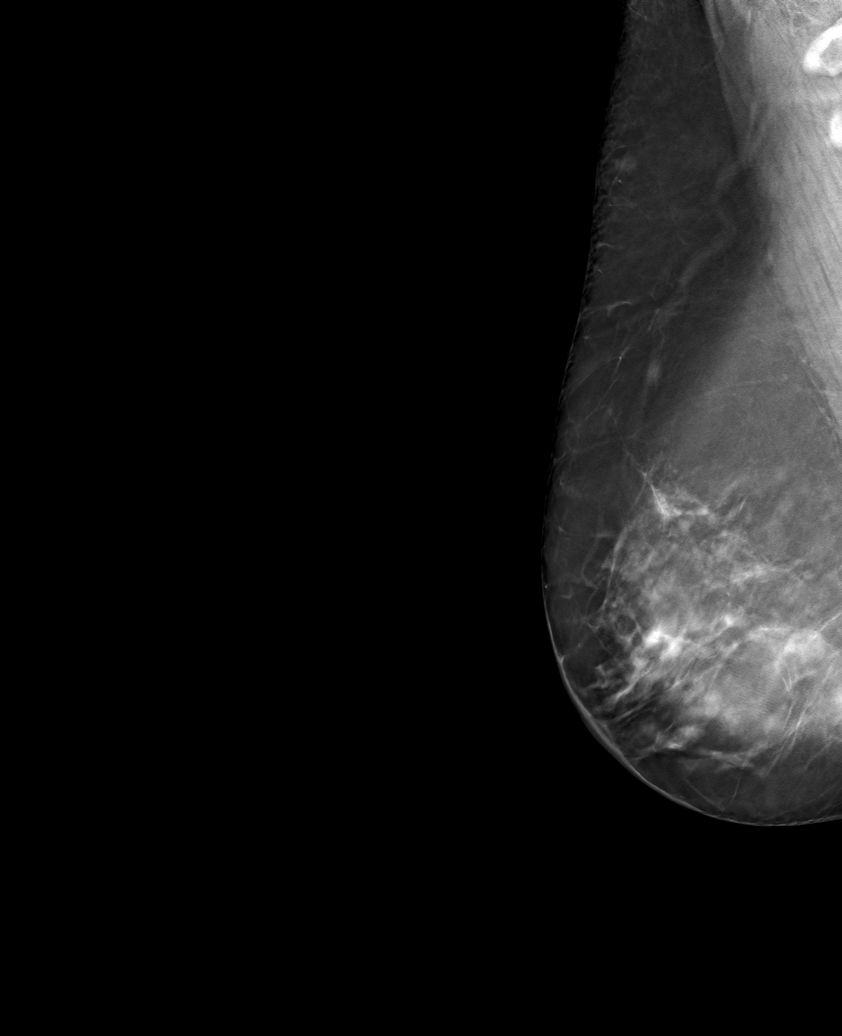

[6 of 18 positions shown; findings below may reference images not displayed]

ACR Breast Density Category b: There are scattered areas of
fibroglandular density.
FINDINGS: A radiopaque BB was placed at the site of the patient's palpable
lump inferior right breast at middle depth. No focal or suspicious
findings are seen deep to the radiopaque BB or within the remainder
of the breast.

Mammographic images were processed with CAD.

On physical exam, I palpate no suspicious lumps in the inferior
right breast.

Targeted ultrasound is performed, showing a prominent fat lobule
measuring up to 2.8 cm at the [DATE] position 4 cm from the nipple.
This corresponds with the patient's palpable lump. No additional
suspicious findings are identified.
IMPRESSION: Benign right breast palpable lump corresponding with a prominent fat
lobule. No further imaging follow-up required.

RECOMMENDATION:
Patient is due for annual bilateral screening in July 2018.

I have discussed the findings and recommendations with the patient.
Results were also provided in writing at the conclusion of the
visit. If applicable, a reminder letter will be sent to the patient
regarding the next appointment.

BI-RADS CATEGORY  2: Benign.

## 2019-04-22 ENCOUNTER — Encounter: Payer: Self-pay | Admitting: Vascular Surgery

## 2019-05-03 DIAGNOSIS — M859 Disorder of bone density and structure, unspecified: Secondary | ICD-10-CM | POA: Diagnosis not present

## 2019-05-03 DIAGNOSIS — E7849 Other hyperlipidemia: Secondary | ICD-10-CM | POA: Diagnosis not present

## 2019-05-10 DIAGNOSIS — K219 Gastro-esophageal reflux disease without esophagitis: Secondary | ICD-10-CM | POA: Diagnosis not present

## 2019-05-10 DIAGNOSIS — G43909 Migraine, unspecified, not intractable, without status migrainosus: Secondary | ICD-10-CM | POA: Diagnosis not present

## 2019-05-10 DIAGNOSIS — E785 Hyperlipidemia, unspecified: Secondary | ICD-10-CM | POA: Diagnosis not present

## 2019-05-10 DIAGNOSIS — G2581 Restless legs syndrome: Secondary | ICD-10-CM | POA: Diagnosis not present

## 2019-05-10 DIAGNOSIS — I10 Essential (primary) hypertension: Secondary | ICD-10-CM | POA: Diagnosis not present

## 2019-05-10 DIAGNOSIS — Z0000 Encounter for general adult medical examination without abnormal findings: Secondary | ICD-10-CM | POA: Diagnosis not present

## 2019-05-10 DIAGNOSIS — I872 Venous insufficiency (chronic) (peripheral): Secondary | ICD-10-CM | POA: Diagnosis not present

## 2019-05-10 DIAGNOSIS — M8580 Other specified disorders of bone density and structure, unspecified site: Secondary | ICD-10-CM | POA: Diagnosis not present

## 2019-05-10 DIAGNOSIS — J309 Allergic rhinitis, unspecified: Secondary | ICD-10-CM | POA: Diagnosis not present

## 2019-05-10 DIAGNOSIS — Z1331 Encounter for screening for depression: Secondary | ICD-10-CM | POA: Diagnosis not present

## 2019-06-23 DIAGNOSIS — C44612 Basal cell carcinoma of skin of right upper limb, including shoulder: Secondary | ICD-10-CM | POA: Diagnosis not present

## 2019-06-23 DIAGNOSIS — L814 Other melanin hyperpigmentation: Secondary | ICD-10-CM | POA: Diagnosis not present

## 2019-06-23 DIAGNOSIS — D2272 Melanocytic nevi of left lower limb, including hip: Secondary | ICD-10-CM | POA: Diagnosis not present

## 2019-06-23 DIAGNOSIS — L570 Actinic keratosis: Secondary | ICD-10-CM | POA: Diagnosis not present

## 2019-06-23 DIAGNOSIS — L821 Other seborrheic keratosis: Secondary | ICD-10-CM | POA: Diagnosis not present

## 2019-06-23 DIAGNOSIS — L820 Inflamed seborrheic keratosis: Secondary | ICD-10-CM | POA: Diagnosis not present

## 2019-06-23 DIAGNOSIS — Z85828 Personal history of other malignant neoplasm of skin: Secondary | ICD-10-CM | POA: Diagnosis not present

## 2019-06-23 DIAGNOSIS — D2271 Melanocytic nevi of right lower limb, including hip: Secondary | ICD-10-CM | POA: Diagnosis not present

## 2019-06-23 DIAGNOSIS — D1801 Hemangioma of skin and subcutaneous tissue: Secondary | ICD-10-CM | POA: Diagnosis not present

## 2019-06-23 DIAGNOSIS — L818 Other specified disorders of pigmentation: Secondary | ICD-10-CM | POA: Diagnosis not present

## 2019-07-27 ENCOUNTER — Other Ambulatory Visit: Payer: Self-pay

## 2019-07-27 ENCOUNTER — Encounter: Payer: Self-pay | Admitting: Podiatry

## 2019-07-27 ENCOUNTER — Ambulatory Visit (INDEPENDENT_AMBULATORY_CARE_PROVIDER_SITE_OTHER): Payer: Medicare PPO | Admitting: Podiatry

## 2019-07-27 VITALS — Temp 97.7°F

## 2019-07-27 DIAGNOSIS — Q828 Other specified congenital malformations of skin: Secondary | ICD-10-CM

## 2019-07-27 DIAGNOSIS — M216X2 Other acquired deformities of left foot: Secondary | ICD-10-CM | POA: Diagnosis not present

## 2019-08-02 ENCOUNTER — Other Ambulatory Visit: Payer: Self-pay

## 2019-08-02 ENCOUNTER — Ambulatory Visit (INDEPENDENT_AMBULATORY_CARE_PROVIDER_SITE_OTHER): Payer: Medicare PPO | Admitting: Podiatrist

## 2019-08-02 VITALS — Temp 97.3°F

## 2019-08-02 DIAGNOSIS — M216X2 Other acquired deformities of left foot: Secondary | ICD-10-CM | POA: Diagnosis not present

## 2019-08-02 DIAGNOSIS — G5761 Lesion of plantar nerve, right lower limb: Secondary | ICD-10-CM

## 2019-08-02 DIAGNOSIS — B351 Tinea unguium: Secondary | ICD-10-CM | POA: Diagnosis not present

## 2019-08-02 LAB — HEPATIC FUNCTION PANEL
AG Ratio: 1.8 (calc) (ref 1.0–2.5)
ALT: 13 U/L (ref 6–29)
AST: 16 U/L (ref 10–35)
Albumin: 4.5 g/dL (ref 3.6–5.1)
Alkaline phosphatase (APISO): 73 U/L (ref 37–153)
Bilirubin, Direct: 0.1 mg/dL (ref 0.0–0.2)
Globulin: 2.5 g/dL (calc) (ref 1.9–3.7)
Indirect Bilirubin: 0.4 mg/dL (calc) (ref 0.2–1.2)
Total Bilirubin: 0.5 mg/dL (ref 0.2–1.2)
Total Protein: 7 g/dL (ref 6.1–8.1)

## 2019-08-02 NOTE — Patient Instructions (Addendum)
I'll have our office check with insurance to see if orthotics are covered  Aquaphor is the cream to put on your baby toe to help with the cracking.  I have ordered a liver panel to check to be sure you are safe for the lamisil for the toenails-  Will call with results.

## 2019-08-02 NOTE — Progress Notes (Signed)
Subjective: 74 year old female presents the office today for painful calluses to both balls of her feet.  She describes burning sensation to the area as well.  She also states that on the second toe which is a hammertoe it feels "funny" still.  Her primary concern is the pain in the ball of her feet on both sides.  She wants to discuss treatment options for this. Denies any systemic complaints such as fevers, chills, nausea, vomiting. No acute changes since last appointment, and no other complaints at this time.   Objective: AAO x3, NAD DP/PT pulses palpable bilaterally, CRT less than 3 seconds Previous hammertoe repair of the second digits.  Toes are rectus position.  No significant discomfort.  There is significant prominence of metatarsal heads plantarly with atrophy of the fat pad.  This is resulting hyperkeratotic lesions which are causing discomfort which she describes as pain as well as burning at times. No open lesions.  No pain with calf compression, swelling, warmth, erythema  Assessment: Problem metatarsal heads resulted in hyperkeratotic lesions  Plan: -All treatment options discussed with the patient including all alternatives, risks, complications.  -Debrided hyperkeratotic lesion without any complications or bleeding.  We had a long discussion regards to treatment options.  We discussed a more cushion type orthotic.  Also we discussed possible fillers that can be done submetatarsal help give some cushion.  Discussed this is not a permanent fix but could be helpful although this is a newer treatment.  She would like a second opinion.  She is in a follow-up with Dr. [name] for this.  -Patient encouraged to call the office with any questions, concerns, change in symptoms.   Trula Slade DPM

## 2019-08-05 ENCOUNTER — Telehealth: Payer: Self-pay | Admitting: *Deleted

## 2019-08-05 MED ORDER — TERBINAFINE HCL 250 MG PO TABS: 250.0000 mg | ORAL_TABLET | Freq: Every day | ORAL | 0 refills | Status: DC

## 2019-08-05 NOTE — Telephone Encounter (Signed)
I nformed pt of Dr. Shaune Pollack review of results and orders. Pt states understanding and would like the orders called to [place].

## 2019-08-05 NOTE — Telephone Encounter (Signed)
This patient had liver function tests done for Lamisil- her labs are fine. If she would like to take Lamisil I can call it in for her if she would like.   Is [facility] still the best place to get Lamisil filled?   Also- if there is someone else who also takes these type messages feel free to point me in their direction.,   Thanks!! Dr. [name]

## 2019-08-08 NOTE — Progress Notes (Signed)
Chief Complaint  Patient presents with  . Foot Problem    Bilateral plantar forefoot - pain and calluses. Pt requests 2nd opinion. Pt stated, "I can't walk because of the severe pain. The R 3rd toe also hurts. I've been using Voltaren OTC - I know that it takes time to cause improvement".  . Nail Problem    Bilateral hallux - nail fungus per pt. Pt stated, "I've been using the medication for about a year, but it isn't improving".     HPI: Patient is 74 y.o. female who presents today for the concerns as stated above.  She experiences significant pain in her forefoot region bilaterally and would like to know if there are any options to help her.  She also has discoloration on both great toenails which is not improving with the topical medication.     Allergies  Allergen Reactions  . Cephalexin   . Nitrofurantoin Monohyd Macro   . Other   . Sulfa Antibiotics     Review of systems is reviewed and negative.   Physical Exam  Patient is awake, alert, and oriented x 3.  In no acute distress.    Vascular status is intact with palpable pedal pulses DP and PT bilateral and capillary refill time less than 3 seconds bilateral.  No edema or erythema noted.  Neurological exam reveals epicritic and protective sensation grossly intact bilateral.  Pain within the second interspace of the right foot is elicited with intermetatarsal pressure- concerning for a neuroma versus metatarsalgia.    Dermatological exam reveals skin is supple and dry to bilateral feet.  No open lesions present. Bilateral hallux nails are discolored and thickened.    Musculoskeletal exam: prominent and plantarflexed metatarsals with fat pad atrophy and overlying calluses present bilaterally.     Assessment:   ICD-10-CM   1. Nail fungus  B35.1 Hepatic Function Panel  2. Prominent metatarsal head of left foot  M21.6X2   3. 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 The patient wanted to proceed and an injection of dexamethasone phosphate and marcaine plain was inflitrated into the second interapace of the right foot without complication.  Discussed the positive benefit of an accomidative orthotic device with a proximal metatarsal pad to take pressure off the metatarsal heads.  Also discussed the possibility of filler injections into the metatarsal head region.  I will find out about the filler injections with Dr. March Rummage and let her know her options for these.  She will return for a recheck in 1-2 weeks.

## 2019-08-16 ENCOUNTER — Other Ambulatory Visit: Payer: Self-pay

## 2019-08-16 ENCOUNTER — Ambulatory Visit: Payer: Medicare PPO | Admitting: Podiatrist

## 2019-08-16 ENCOUNTER — Encounter: Payer: Self-pay | Admitting: Podiatrist

## 2019-08-16 VITALS — Temp 96.1°F

## 2019-08-16 DIAGNOSIS — M216X2 Other acquired deformities of left foot: Secondary | ICD-10-CM | POA: Diagnosis not present

## 2019-08-16 DIAGNOSIS — Q828 Other specified congenital malformations of skin: Secondary | ICD-10-CM | POA: Diagnosis not present

## 2019-08-16 DIAGNOSIS — G5761 Lesion of plantar nerve, right lower limb: Secondary | ICD-10-CM

## 2019-08-16 NOTE — Patient Instructions (Signed)
Wear the pads to keep the pressure off the forefoot region when you are walking.  I will let you know when the fat pad injections are approved.    Call if the ball of foot pain comes back and we can do another injection.

## 2019-08-16 NOTE — Progress Notes (Signed)
Chief Complaint  Patient presents with  . Follow-up    BL foot pain follow up; "still some pain on bottom but not as bad as before; no other concerns"    Patient presents today for follow up of bilateral forefoot pain.  I gave her an injection at the last visit in the second interspace of the right foot and she states it has helped.  Objective:  Neurovascular status intact and unchanged from previous visit.  promiinent and plantarflexed metatarsals with fat pad atrophy present.  Neuroma symptomatology is reduced today. Calluses present forefoot bilateral due to plantarflexed metatarsals.   Assessment/Plan:    ICD-10-CM   1. Prominent metatarsal head of left foot  M21.6X2   2. 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 Porokeratosis  Q82.8    Recommended holding off on a second injection as she has improved with the first and I don't want to add steroid to an area that already doesn't  Have much fat pad there.  Discussed the filler injections are an option however they are not covered by insurance at this time.  Also discussed the positive long term benefits of orthotics.  A removable strapping is made for her to get an idea of what an orthotic could do for her.  She will call if symptoms don't improve and will be back for callus debridment as needed.

## 2019-11-03 ENCOUNTER — Telehealth: Payer: Self-pay | Admitting: Podiatrist

## 2019-11-03 NOTE — Telephone Encounter (Signed)
I'm calling about account for $70 and I want to find out the dates on it. My account number is [id number]. You can call me at [phone]. Thank you.

## 2019-11-09 DIAGNOSIS — L821 Other seborrheic keratosis: Secondary | ICD-10-CM | POA: Diagnosis not present

## 2019-11-09 DIAGNOSIS — L304 Erythema intertrigo: Secondary | ICD-10-CM | POA: Diagnosis not present

## 2019-11-09 DIAGNOSIS — Z85828 Personal history of other malignant neoplasm of skin: Secondary | ICD-10-CM | POA: Diagnosis not present

## 2019-11-09 DIAGNOSIS — L820 Inflamed seborrheic keratosis: Secondary | ICD-10-CM | POA: Diagnosis not present

## 2019-11-15 ENCOUNTER — Ambulatory Visit: Payer: Medicare PPO | Admitting: Podiatrist

## 2019-11-15 ENCOUNTER — Other Ambulatory Visit: Payer: Self-pay

## 2019-11-15 ENCOUNTER — Encounter: Payer: Self-pay | Admitting: Podiatrist

## 2019-11-15 DIAGNOSIS — Q828 Other specified congenital malformations of skin: Secondary | ICD-10-CM

## 2019-11-15 DIAGNOSIS — M216X2 Other acquired deformities of left foot: Secondary | ICD-10-CM | POA: Diagnosis not present

## 2019-11-15 DIAGNOSIS — B351 Tinea unguium: Secondary | ICD-10-CM | POA: Diagnosis not present

## 2019-11-15 DIAGNOSIS — G5761 Lesion of plantar nerve, right lower limb: Secondary | ICD-10-CM | POA: Diagnosis not present

## 2019-11-15 NOTE — Progress Notes (Signed)
   Chief Complaint  Patient presents with  . Follow-up    Bilateral plantar forefoot - painful calluses. Pain = 10/10 per pt. She stated, "It feels like nails are sticking in my foot. I wore the strapping, but it rolled up when I put on/removed my shoes".     HPI: Patient is 74 y.o. female who presents today for follow up calluses on the bottoms of her feet which are painful and make walking difficult.  She also relates she is getting cramping in her feet and legs. She also states the right great toenail looks more normal after completing her 90 day course of lamisil.     Allergies  Allergen Reactions  . Cephalexin   . Nitrofurantoin Monohyd Macro   . Other   . Sulfa Antibiotics     Review of systems is reviewed and negative.   Physical Exam  Patient is awake, alert, and oriented x 3.  In no acute distress.    Vascular status is intact with palpable pedal pulses DP and PT bilateral and capillary refill time less than 3 seconds bilateral.  No edema or erythema noted.   Neurological exam reveals epicritic and protective sensation grossly intact bilateral.   Dermatological exam reveals skin is supple and dry to bilateral feet.  Multiple diffuse hyperkeratotic/porokeratotic lesions are present submet 2-4 of the right foot with a total number of 5 presnt. Left foot also has hyperkeratotic lesions x 2.  Fat pad is translating in a more proximal direction under the toes.  Right hallux nail is painted so unable to assess.     Musculoskeletal exam: Musculature intact with dorsiflexion, plantarflexion, inversion, eversion. Ankle and First MPJ joint range of motion normal.  Prominent and plantarflexed metatarsals are noted bilateraly. Pain second interspace is present - possible neuroma vs capsulitis.     Assessment:   ICD-10-CM   1. 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 Prominent metatarsal head of left foot  M21.6X2   3. Porokeratosis  Q82.8   4. Nail fungus  B35.1       Plan: Injected the second interspace with a 10mg  kenalog and marcaine mixture without complication.  Pared the lesions with a 15 blade.  Dispensed a forefoot silicone padding bilaterally.  She will continue taking the hyalin quinine cramp medicine and will call if she needs something stronger. She will be seen back in 1-2 months for follow up.

## 2020-01-17 ENCOUNTER — Other Ambulatory Visit: Payer: Self-pay | Admitting: Internal Medicine

## 2020-01-17 DIAGNOSIS — Z1231 Encounter for screening mammogram for malignant neoplasm of breast: Secondary | ICD-10-CM

## 2020-02-02 ENCOUNTER — Ambulatory Visit
Admission: RE | Admit: 2020-02-02 | Discharge: 2020-02-02 | Disposition: A | Discharge status: Home / Self Care | Payer: Medicare PPO | Source: Ambulatory Visit | Attending: Internal Medicine | Admitting: Internal Medicine

## 2020-02-02 ENCOUNTER — Other Ambulatory Visit: Payer: Self-pay

## 2020-02-02 DIAGNOSIS — Z1231 Encounter for screening mammogram for malignant neoplasm of breast: Secondary | ICD-10-CM | POA: Diagnosis not present

## 2020-02-22 ENCOUNTER — Other Ambulatory Visit: Payer: Self-pay

## 2020-02-22 ENCOUNTER — Other Ambulatory Visit: Payer: Self-pay | Admitting: Nurse Practitioner

## 2020-02-22 ENCOUNTER — Ambulatory Visit: Payer: Medicare PPO | Admitting: Nurse Practitioner

## 2020-02-22 ENCOUNTER — Encounter: Payer: Self-pay | Admitting: Nurse Practitioner

## 2020-02-22 VITALS — BP 138/80 | Ht 62.0 in | Wt 158.0 lb

## 2020-02-22 DIAGNOSIS — B369 Superficial mycosis, unspecified: Secondary | ICD-10-CM | POA: Diagnosis not present

## 2020-02-22 DIAGNOSIS — Z90722 Acquired absence of ovaries, bilateral: Secondary | ICD-10-CM | POA: Insufficient documentation

## 2020-02-22 DIAGNOSIS — Z780 Asymptomatic menopausal state: Secondary | ICD-10-CM | POA: Diagnosis not present

## 2020-02-22 DIAGNOSIS — Z01419 Encounter for gynecological examination (general) (routine) without abnormal findings: Secondary | ICD-10-CM

## 2020-02-22 DIAGNOSIS — Z90710 Acquired absence of both cervix and uterus: Secondary | ICD-10-CM

## 2020-02-22 DIAGNOSIS — M8589 Other specified disorders of bone density and structure, multiple sites: Secondary | ICD-10-CM

## 2020-02-22 MED ORDER — NYSTATIN-TRIAMCINOLONE 100000-0.1 UNIT/GM-% EX OINT: 1.0000 "application " | TOPICAL_OINTMENT | Freq: Two times a day (BID) | CUTANEOUS | 0 refills | Status: DC

## 2020-02-22 MED ORDER — NYSTATIN 100000 UNIT/GM EX POWD: Freq: Two times a day (BID) | CUTANEOUS | 0 refills | Status: DC

## 2020-02-22 NOTE — Progress Notes (Signed)
Natalie [name] [birth year] [id number]   History:  74 y.o. [id number] presents for breast and pelvic exam. 1982 TAH BSO for DUB on Estrace 0.25 mg every other day with good relief of hot flashes and insomnia. She has tried to wean HRT in the past but was unable to tolerate. Osteopenia. Normal pap and mammogram history. Has a spot on her right nipple that dermatology has been following. She was prescribed a steroid cream with minimal improvement.   Gynecologic History No LMP recorded. Patient has had a hysterectomy.   Last Pap: No longer screening per guidelines Last mammogram: 02/06/2020. Results were: normal Last colonoscopy: 04/2018. Results were: polyps Last Dexa: 05/23/2015. Results were: t-score -1.9, FRAX 12% / 2.6%. 3-5 year repeat recommended  Past medical history, past surgical history, family history and social history were all reviewed and documented in the EPIC chart.  ROS:  A ROS was performed and pertinent positives and negatives are included.  Exam:  Vitals:   02/22/20 1101  BP: 138/80  Weight: 158 lb (71.7 kg)  Height: 5\' 2"  (1.575 m)   Body mass index is 28.9 kg/m.  General appearance:  Normal Thyroid:  Symmetrical, normal in size, without palpable masses or nodularity. Respiratory  Auscultation:  Clear without wheezing or rhonchi Cardiovascular  Auscultation:  Regular rate, without rubs, murmurs or gallops  Edema/varicosities:  Not grossly evident Abdominal  Soft,nontender, without masses, guarding or rebound.  Liver/spleen:  No organomegaly noted  Hernia:  None appreciated  Skin  Inspection:  Grossly normal   Breasts: Examined lying and sitting.   Right: Without masses, retractions, discharge or axillary adenopathy. Small dry patch on right superior nipple, erythematous base  Left: Without masses, retractions, discharge or axillary adenopathy. Gentitourinary   Inguinal/mons:  Normal without inguinal adenopathy  External genitalia:   Normal  BUS/Urethra/Skene's glands:  Normal  Vagina:  Normal  Cervix:  Absent  Uterus:  Absent  Adnexa/parametria:     Rt: Without masses or tenderness.   Lt: Without masses or tenderness.  Anus and perineum: Normal  Digital rectal exam: Normal sphincter tone without palpated masses or tenderness  Assessment/Plan:  74 y.o. [id number] for breast and pelvic exam.   Well female exam with routine gynecological exam - Education provided on SBEs, importance of preventative screenings, current guidelines, high calcium diet, regular exercise, and multivitamin daily. Labs with PCP.  Osteopenia of multiple sites - 04/2015 t-score -1.9 without elevated FRAX. Due for Bone density. Taking daily Vitamin D supplement. Has not been as active due to hammer toe surgery.   History of total abdominal hysterectomy and bilateral salpingo-oophorectomy - 1982 for DUB. Taking Estace 0.25 mg every other day with good relief of hot flashes and insomnia.  Has tried to wean in the past but was unable to tolerate.  We discussed the risk of blood clots, heart attack, stroke, and breast cancer.  We are both in agreement to try to wean as tolerated.  Postmenopausal - Plan: DG Bone Density  Fungal skin disease - Plan: nystatin (MYCOSTATIN/NYSTOP) powder, nystatin-triamcinolone ointment (MYCOLOG).  Redness under both breasts consistent with yeast.  She has used nystatin powder and Mycolog ointment in the past with good relief and she would like refills for both of these. She will follow with dermatology for area on right nipple.  Screening for cervical cancer -normal Pap history.  No longer screening per guidelines.  Screening for breast cancer -normal mammogram history.  Continue annual screenings.  Normal breast exam today.  Screening for  colon cancer -benign polyps on most recent colonoscopy in January 2020.  She will follow-up per GIs recommended interval.  Follow-up in 1 year for annual.      Tamela Gammon  [facility], 11:12 AM 02/22/2020

## 2020-02-22 NOTE — Patient Instructions (Signed)
Health Maintenance After Age 76 After age 76, you are at a higher risk for certain long-term diseases and infections as well as injuries from falls. Falls are a major cause of broken bones and head injuries in people who are older than age 76. Getting regular preventive care can help to keep you healthy and well. Preventive care includes getting regular testing and making lifestyle changes as recommended by your health care provider. Talk with your health care provider about:  Which screenings and tests you should have. A screening is a test that checks for a disease when you have no symptoms.  A diet and exercise plan that is right for you. What should I know about screenings and tests to prevent falls? Screening and testing are the best ways to find a health problem early. Early diagnosis and treatment give you the best chance of managing medical conditions that are common after age 76. Certain conditions and lifestyle choices may make you more likely to have a fall. Your health care provider may recommend:  Regular vision checks. Poor vision and conditions such as cataracts can make you more likely to have a fall. If you wear glasses, make sure to get your prescription updated if your vision changes.  Medicine review. Work with your health care provider to regularly review all of the medicines you are taking, including over-the-counter medicines. Ask your health care provider about any side effects that may make you more likely to have a fall. Tell your health care provider if any medicines that you take make you feel dizzy or sleepy.  Osteoporosis screening. Osteoporosis is a condition that causes the bones to get weaker. This can make the bones weak and cause them to break more easily.  Blood pressure screening. Blood pressure changes and medicines to control blood pressure can make you feel dizzy.  Strength and balance checks. Your health care provider may recommend certain tests to check your  strength and balance while standing, walking, or changing positions.  Foot health exam. Foot pain and numbness, as well as not wearing proper footwear, can make you more likely to have a fall.  Depression screening. You may be more likely to have a fall if you have a fear of falling, feel emotionally low, or feel unable to do activities that you used to do.  Alcohol use screening. Using too much alcohol can affect your balance and may make you more likely to have a fall. What actions can I take to lower my risk of falls? General instructions  Talk with your health care provider about your risks for falling. Tell your health care provider if: ? You fall. Be sure to tell your health care provider about all falls, even ones that seem minor. ? You feel dizzy, sleepy, or off-balance.  Take over-the-counter and prescription medicines only as told by your health care provider. These include any supplements.  Eat a healthy diet and maintain a healthy weight. A healthy diet includes low-fat dairy products, low-fat (lean) meats, and fiber from whole grains, beans, and lots of fruits and vegetables. Home safety  Remove any tripping hazards, such as rugs, cords, and clutter.  Install safety equipment such as grab bars in bathrooms and safety rails on stairs.  Keep rooms and walkways well-lit. Activity   Follow a regular exercise program to stay fit. This will help you maintain your balance. Ask your health care provider what types of exercise are appropriate for you.  If you need a cane or   walker, use it as recommended by your health care provider.  Wear supportive shoes that have nonskid soles. Lifestyle  Do not drink alcohol if your health care provider tells you not to drink.  If you drink alcohol, limit how much you have: ? 0-1 drink a day for women. ? 0-2 drinks a day for men.  Be aware of how much alcohol is in your drink. In the [place] one drink equals one typical bottle of beer (12  oz), one-half glass of wine (5 oz), or one shot of hard liquor (1 oz).  Do not use any products that contain nicotine or tobacco, such as cigarettes and e-cigarettes. If you need help quitting, ask your health care provider. Summary  Having a healthy lifestyle and getting preventive care can help to protect your health and wellness after age 76.  Screening and testing are the best way to find a health problem early and help you avoid having a fall. Early diagnosis and treatment give you the best chance for managing medical conditions that are more common for people who are older than age 76.  Falls are a major cause of broken bones and head injuries in people who are older than age 76. Take precautions to prevent a fall at home.  Work with your health care provider to learn what changes you can make to improve your health and wellness and to prevent falls. This information is not intended to replace advice given to you by your health care provider. Make sure you discuss any questions you have with your health care provider. Document Revised: 07/30/2018 Document Reviewed: 02/19/2017 Elsevier Patient Education  2020 [employer]  

## 2020-02-23 ENCOUNTER — Telehealth: Payer: Self-pay | Admitting: *Deleted

## 2020-02-23 NOTE — Telephone Encounter (Signed)
PA done via cover my meds for nystatin powder, medication approved until 04/21/21.

## 2020-03-28 DIAGNOSIS — Z85828 Personal history of other malignant neoplasm of skin: Secondary | ICD-10-CM | POA: Diagnosis not present

## 2020-03-28 DIAGNOSIS — L304 Erythema intertrigo: Secondary | ICD-10-CM | POA: Diagnosis not present

## 2020-03-28 DIAGNOSIS — L309 Dermatitis, unspecified: Secondary | ICD-10-CM | POA: Diagnosis not present

## 2020-03-28 DIAGNOSIS — L821 Other seborrheic keratosis: Secondary | ICD-10-CM | POA: Diagnosis not present

## 2020-04-18 ENCOUNTER — Other Ambulatory Visit: Payer: Self-pay | Admitting: Nurse Practitioner

## 2020-04-18 ENCOUNTER — Ambulatory Visit (INDEPENDENT_AMBULATORY_CARE_PROVIDER_SITE_OTHER): Payer: Medicare PPO

## 2020-04-18 ENCOUNTER — Other Ambulatory Visit: Payer: Self-pay

## 2020-04-18 DIAGNOSIS — Z780 Asymptomatic menopausal state: Secondary | ICD-10-CM

## 2020-04-18 DIAGNOSIS — M8589 Other specified disorders of bone density and structure, multiple sites: Secondary | ICD-10-CM

## 2020-04-18 DIAGNOSIS — M25512 Pain in left shoulder: Secondary | ICD-10-CM | POA: Diagnosis not present

## 2020-04-18 DIAGNOSIS — M7541 Impingement syndrome of right shoulder: Secondary | ICD-10-CM | POA: Diagnosis not present

## 2020-04-25 DIAGNOSIS — L304 Erythema intertrigo: Secondary | ICD-10-CM | POA: Diagnosis not present

## 2020-04-25 DIAGNOSIS — L570 Actinic keratosis: Secondary | ICD-10-CM | POA: Diagnosis not present

## 2020-04-25 DIAGNOSIS — L821 Other seborrheic keratosis: Secondary | ICD-10-CM | POA: Diagnosis not present

## 2020-04-25 DIAGNOSIS — D1801 Hemangioma of skin and subcutaneous tissue: Secondary | ICD-10-CM | POA: Diagnosis not present

## 2020-04-25 DIAGNOSIS — Z85828 Personal history of other malignant neoplasm of skin: Secondary | ICD-10-CM | POA: Diagnosis not present

## 2020-05-01 ENCOUNTER — Other Ambulatory Visit: Payer: Self-pay

## 2020-05-01 ENCOUNTER — Ambulatory Visit: Payer: Medicare PPO | Admitting: Podiatry

## 2020-05-01 DIAGNOSIS — L84 Corns and callosities: Secondary | ICD-10-CM

## 2020-05-01 DIAGNOSIS — M216X2 Other acquired deformities of left foot: Secondary | ICD-10-CM

## 2020-05-01 DIAGNOSIS — Q828 Other specified congenital malformations of skin: Secondary | ICD-10-CM

## 2020-05-01 DIAGNOSIS — B351 Tinea unguium: Secondary | ICD-10-CM

## 2020-05-01 DIAGNOSIS — Z79899 Other long term (current) drug therapy: Secondary | ICD-10-CM | POA: Diagnosis not present

## 2020-05-01 MED ORDER — GORDONS UREA 40 % EX OINT: TOPICAL_OINTMENT | CUTANEOUS | 0 refills | Status: DC | PRN

## 2020-05-02 LAB — CBC WITH DIFFERENTIAL/PLATELET
Absolute Monocytes: 481 cells/uL (ref 200–950)
Basophils Absolute: 27 cells/uL (ref 0–200)
Basophils Relative: 0.3 %
Eosinophils Absolute: 151 cells/uL (ref 15–500)
Eosinophils Relative: 1.7 %
HCT: 35 % (ref 35.0–45.0)
Hemoglobin: 11.7 g/dL (ref 11.7–15.5)
Lymphs Abs: 2011 cells/uL (ref 850–3900)
MCH: 30.5 pg (ref 27.0–33.0)
MCHC: 33.4 g/dL (ref 32.0–36.0)
MCV: 91.4 fL (ref 80.0–100.0)
MPV: 10.8 fL (ref 7.5–12.5)
Monocytes Relative: 5.4 %
Neutro Abs: 6230 cells/uL (ref 1500–7800)
Neutrophils Relative %: 70 %
Platelets: 254 10*3/uL (ref 140–400)
RBC: 3.83 10*6/uL (ref 3.80–5.10)
RDW: 12.7 % (ref 11.0–15.0)
Total Lymphocyte: 22.6 %
WBC: 8.9 10*3/uL (ref 3.8–10.8)

## 2020-05-02 LAB — HEPATIC FUNCTION PANEL
AG Ratio: 1.9 (calc) (ref 1.0–2.5)
ALT: 13 U/L (ref 6–29)
AST: 14 U/L (ref 10–35)
Albumin: 4.4 g/dL (ref 3.6–5.1)
Alkaline phosphatase (APISO): 63 U/L (ref 37–153)
Bilirubin, Direct: 0.1 mg/dL (ref 0.0–0.2)
Globulin: 2.3 g/dL (calc) (ref 1.9–3.7)
Indirect Bilirubin: 0.4 mg/dL (calc) (ref 0.2–1.2)
Total Bilirubin: 0.5 mg/dL (ref 0.2–1.2)
Total Protein: 6.7 g/dL (ref 6.1–8.1)

## 2020-05-03 ENCOUNTER — Telehealth: Payer: Self-pay | Admitting: Podiatry

## 2020-05-03 NOTE — Telephone Encounter (Signed)
[employer] called on behalf of patient regarding prescription Urea 40% creme which is not covered and has suggested that Ammonium Lactate be prescribed instead, Please Advise

## 2020-05-04 ENCOUNTER — Other Ambulatory Visit: Payer: Self-pay | Admitting: Podiatry

## 2020-05-04 ENCOUNTER — Telehealth: Payer: Self-pay | Admitting: *Deleted

## 2020-05-04 MED ORDER — TERBINAFINE HCL 250 MG PO TABS: 250.0000 mg | ORAL_TABLET | Freq: Every day | ORAL | 0 refills | Status: DC

## 2020-05-04 MED ORDER — AMMONIUM LACTATE 12 % EX CREA: TOPICAL_CREAM | CUTANEOUS | 0 refills | Status: DC | PRN

## 2020-05-04 NOTE — Telephone Encounter (Signed)
sent 

## 2020-05-04 NOTE — Telephone Encounter (Signed)
-----   Message from Trula Slade, DPM sent at 05/04/2020  9:23 AM EST ----- Lattie Haw- can you let her know that the blood work is normal and I have sent Lamisil to the pharmacy for her.

## 2020-05-04 NOTE — Telephone Encounter (Signed)
[name] and stated that the blood work was normal and that Dr Jacqualyn Posey sent over the lamisil to the pharmacy. Natalie [name]

## 2020-05-04 NOTE — Progress Notes (Signed)
Subjective: 75 year old female presents the office today for painful calluses to both balls of her feet and also asking for refill of terbinafine.  She has been on this last year and no side effects.  The nails are looking better but still have fungus and she wants to have another round of the medicine.  They callus still cause quite a bit of discomfort and they get thick.  Denies any open lesions, swelling.  No recent injury or trauma. Denies any systemic complaints such as fevers, chills, nausea, vomiting. No acute changes since last appointment, and no other complaints at this time.   Objective: AAO x3, NAD DP/PT pulses palpable bilaterally, CRT less than 3 seconds Previous hammertoe repair of the second digits.   Hyperkeratotic lesion submetatarsal area right side worse than left.  There is no underlying ulceration drainage or any signs of infection.  The right side is more diffuse under the metatarsal heads and more localized on the left side. Prominent metatarsal heads with atrophy of the fat pad Nails are very hypertrophic, dystrophic with yellow-brown discoloration mostly on the distal one half of the nails.  There is some pain on the proximal portion of the nails.  No edema, erythema or any signs of infection of the toenail sites. No open lesions.  No pain with calf compression, swelling, warmth, erythema  Assessment: Problem metatarsal heads resulted in hyperkeratotic lesions; onychomycosis  Plan: -All treatment options discussed with the patient including all alternatives, risks, complications.  -Debrided hyperkeratotic lesion without any complications or bleeding. Order urea cream to try for the calluses as well.  Continue offloading. -Recheck CBC and LFT prior to starting terbinafine.  Discussed side effects.  Trula Slade DPM

## 2020-05-12 DIAGNOSIS — M7541 Impingement syndrome of right shoulder: Secondary | ICD-10-CM | POA: Diagnosis not present

## 2020-05-12 DIAGNOSIS — M5412 Radiculopathy, cervical region: Secondary | ICD-10-CM | POA: Diagnosis not present

## 2020-05-23 DIAGNOSIS — E785 Hyperlipidemia, unspecified: Secondary | ICD-10-CM | POA: Diagnosis not present

## 2020-05-24 DIAGNOSIS — M25511 Pain in right shoulder: Secondary | ICD-10-CM | POA: Diagnosis not present

## 2020-05-24 DIAGNOSIS — M542 Cervicalgia: Secondary | ICD-10-CM | POA: Diagnosis not present

## 2020-05-30 DIAGNOSIS — E785 Hyperlipidemia, unspecified: Secondary | ICD-10-CM | POA: Diagnosis not present

## 2020-05-30 DIAGNOSIS — G2581 Restless legs syndrome: Secondary | ICD-10-CM | POA: Diagnosis not present

## 2020-05-30 DIAGNOSIS — Z0000 Encounter for general adult medical examination without abnormal findings: Secondary | ICD-10-CM | POA: Diagnosis not present

## 2020-05-30 DIAGNOSIS — R82998 Other abnormal findings in urine: Secondary | ICD-10-CM | POA: Diagnosis not present

## 2020-05-30 DIAGNOSIS — I10 Essential (primary) hypertension: Secondary | ICD-10-CM | POA: Diagnosis not present

## 2020-05-30 DIAGNOSIS — J309 Allergic rhinitis, unspecified: Secondary | ICD-10-CM | POA: Diagnosis not present

## 2020-05-30 DIAGNOSIS — K8020 Calculus of gallbladder without cholecystitis without obstruction: Secondary | ICD-10-CM | POA: Diagnosis not present

## 2020-05-30 DIAGNOSIS — M1990 Unspecified osteoarthritis, unspecified site: Secondary | ICD-10-CM | POA: Diagnosis not present

## 2020-05-30 DIAGNOSIS — K219 Gastro-esophageal reflux disease without esophagitis: Secondary | ICD-10-CM | POA: Diagnosis not present

## 2020-05-30 DIAGNOSIS — M8580 Other specified disorders of bone density and structure, unspecified site: Secondary | ICD-10-CM | POA: Diagnosis not present

## 2020-06-02 DIAGNOSIS — M25511 Pain in right shoulder: Secondary | ICD-10-CM | POA: Diagnosis not present

## 2020-06-07 DIAGNOSIS — M25511 Pain in right shoulder: Secondary | ICD-10-CM | POA: Diagnosis not present

## 2020-06-15 DIAGNOSIS — M25511 Pain in right shoulder: Secondary | ICD-10-CM | POA: Diagnosis not present

## 2020-06-21 DIAGNOSIS — M25511 Pain in right shoulder: Secondary | ICD-10-CM | POA: Diagnosis not present

## 2020-07-11 DIAGNOSIS — M25511 Pain in right shoulder: Secondary | ICD-10-CM | POA: Diagnosis not present

## 2020-07-11 DIAGNOSIS — M25512 Pain in left shoulder: Secondary | ICD-10-CM | POA: Diagnosis not present

## 2020-07-11 DIAGNOSIS — M1990 Unspecified osteoarthritis, unspecified site: Secondary | ICD-10-CM | POA: Diagnosis not present

## 2020-07-13 DIAGNOSIS — M25511 Pain in right shoulder: Secondary | ICD-10-CM | POA: Diagnosis not present

## 2020-07-17 DIAGNOSIS — M25511 Pain in right shoulder: Secondary | ICD-10-CM | POA: Diagnosis not present

## 2020-07-17 DIAGNOSIS — M47812 Spondylosis without myelopathy or radiculopathy, cervical region: Secondary | ICD-10-CM | POA: Diagnosis not present

## 2020-07-17 DIAGNOSIS — M5030 Other cervical disc degeneration, unspecified cervical region: Secondary | ICD-10-CM | POA: Diagnosis not present

## 2020-08-08 ENCOUNTER — Ambulatory Visit: Payer: Medicare PPO | Admitting: Podiatry

## 2020-08-08 ENCOUNTER — Other Ambulatory Visit: Payer: Self-pay

## 2020-08-08 DIAGNOSIS — M216X9 Other acquired deformities of unspecified foot: Secondary | ICD-10-CM

## 2020-08-08 DIAGNOSIS — B351 Tinea unguium: Secondary | ICD-10-CM | POA: Diagnosis not present

## 2020-08-08 DIAGNOSIS — L84 Corns and callosities: Secondary | ICD-10-CM | POA: Diagnosis not present

## 2020-08-13 NOTE — Progress Notes (Signed)
Subjective: 75 year old female presents the office today for painful calluses to both balls of her feet/she is finished with a course of Lamisil.  She has had no side effects of medication.  Denies any pain in the nails any redness or drainage or any swelling.  No open lesions that she reports.  She has no other concerns today. Denies any systemic complaints such as fevers, chills, nausea, vomiting. No acute changes since last appointment, and no other complaints at this time.   Objective: AAO x3, NAD DP/PT pulses palpable bilaterally, CRT less than 3 seconds Previous hammertoe repair of the second digits.   Hyperkeratotic lesion submetatarsal area right side worse than left.  There is no underlying ulceration drainage or any signs of infection.  The right side is more diffuse under the metatarsal heads and more localized on the left side. Nails.  Mostly unchanged most of the right hallux toenail.  There is clear on the proximal nail fold in the right of the nails hypertrophic, dystrophic with yellow-brown discoloration which remains unchanged.  No pain. No open lesions.  No pain with calf compression, swelling, warmth, erythema  Assessment: Problem metatarsal heads resulted in hyperkeratotic lesions; onychomycosis  Plan: -All treatment options discussed with the patient including all alternatives, risks, complications.  -Debrided hyperkeratotic lesion without any complications or bleeding.  I continue with urea cream, offloading.  Unfortunately due to atrophy of fat pad this is very difficult.  Continue with offloading and supportive shoes. -Removal of any further Lamisil at this time.  Discussed topical that she can use.  Trula Slade DPM

## 2020-08-25 NOTE — Patient Instructions (Addendum)
DUE TO COVID-19 ONLY ONE VISITOR IS ALLOWED TO COME WITH YOU AND STAY IN THE WAITING ROOM ONLY DURING PRE OP AND PROCEDURE.   **NO VISITORS ARE ALLOWED IN THE SHORT STAY AREA OR RECOVERY ROOM!!**        Your procedure is scheduled on:   Thursday, 09-07-20   Report to [facility]  Entrance    Report to admitting at 10:30 AM   Call this number if you have problems the morning of surgery [phone]   Do not eat food :After Midnight.   May have liquids until 9:50 AM  day of surgery  CLEAR LIQUID DIET  Foods Allowed                                                                     Foods Excluded  Water, Black Coffee and tea, regular and decaf              liquids that you cannot  Plain Jell-O in any flavor  (No red)                                     see through such as: Fruit ices (not with fruit pulp)                                      milk, soups, orange juice              Iced Popsicles (No red)                                      All solid food                                   Apple juices Sports drinks like Gatorade (No red) Lightly seasoned clear broth or consume(fat free) Sugar, honey syrup    Complete one Ensure drink the morning of surgery at 9:50 AM  the day of surgery.       1. The day of surgery:  ? Drink ONE (1) Pre-Surgery Clear Ensure or G2 by am the morning of surgery. Drink in one sitting. Do not sip.  ? This drink was given to you during your hospital  pre-op appointment visit. ? Nothing else to drink after completing the  Pre-Surgery Clear Ensure or G2.          If you have questions, please contact your surgeon's office.     Oral Hygiene is also important to reduce your risk of infection.                                    Remember - BRUSH YOUR TEETH THE MORNING OF SURGERY WITH YOUR REGULAR TOOTHPASTE   Do NOT smoke after Midnight   Take these medicines the morning of surgery with A SIP OF WATER:  Omeprazole                                 You may not have any metal on your body including hair pins, jewelry, and body piercings             Do not wear make-up, lotions, powders, perfumes/cologne, or deodorant             Do not wear nail polish.  Do not shave  48 hours prior to surgery.    Do not bring valuables to the hospital. [place].   Contacts, dentures or bridgework may not be worn into surgery.   Patients discharged the day of surgery will not be allowed to drive home.   Special Instructions: Bring a copy of your healthcare power of attorney and living will documents         the day of surgery if you haven't  scanned them in before.              Please read over the following fact sheets you were given: IF YOU HAVE QUESTIONS ABOUT YOUR PRE OP INSTRUCTIONS PLEASE CALL  [place]- Preparing for Total Shoulder Arthroplasty    Before surgery, you can play an important role. Because skin is not sterile, your skin needs to be as free of germs as possible. You can reduce the number of germs on your skin by using the following products. . Benzoyl Peroxide Gel o Reduces the number of germs present on the skin o Applied twice a day to shoulder area starting two days before surgery    ==================================================================  Please follow these instructions carefully:  BENZOYL PEROXIDE 5% GEL  Please do not use if you have an allergy to benzoyl peroxide.   If your skin becomes reddened/irritated stop using the benzoyl peroxide.  Starting two days before surgery, apply as follows: 1. Apply benzoyl peroxide in the morning and at night. Apply after taking a shower. If you are not taking a shower clean entire shoulder front, back, and side along with the armpit with a clean wet washcloth.  2. Place a quarter-sized dollop on your shoulder and rub in thoroughly, making sure to cover the front, back, and side of your shoulder,  along with the armpit.   2 days before ____ AM   ____ PM              1 day before ____ AM   ____ PM                         3. Do this twice a day for two days.  (Last application is the night before surgery, AFTER using the CHG soap as described below).  4. Do NOT apply benzoyl peroxide gel on the day of surgery.  [place] - Preparing for Surgery Before surgery, you can play an important role.  Because skin is not sterile, your skin needs to be as free of germs as possible.  You can reduce the number of germs on your skin by washing with CHG (chlorahexidine gluconate) soap before surgery.  CHG is an antiseptic cleaner which kills germs and bonds with the skin to continue killing germs even after washing. Please DO NOT use if you have an allergy to CHG or antibacterial soaps.  If your skin becomes  reddened/irritated stop using the CHG and inform your nurse when you arrive at Short Stay. Do not shave (including legs and underarms) for at least 48 hours prior to the first CHG shower.  You may shave your face/neck.  Please follow these instructions carefully:  1.  Shower with CHG Soap the night before surgery and the  morning of surgery.  2.  If you choose to wash your hair, wash your hair first as usual with your normal  shampoo.  3.  After you shampoo, rinse your hair and body thoroughly to remove the shampoo.                             4.  Use CHG as you would any other liquid soap.  You can apply chg directly to the skin and wash.  Gently with a scrungie or clean washcloth.  5.  Apply the CHG Soap to your body ONLY FROM THE NECK DOWN.   Do   not use on face/ open                           Wound or open sores. Avoid contact with eyes, ears mouth and   genitals (private parts).                       Wash face,  Genitals (private parts) with your normal soap.             6.  Wash thoroughly, paying special attention to the area where your    surgery  will be performed.  7.  Thoroughly rinse  your body with warm water from the neck down.  8.  DO NOT shower/wash with your normal soap after using and rinsing off the CHG Soap.                9.  Pat yourself dry with a clean towel.            10.  Wear clean pajamas.            11.  Place clean sheets on your bed the night of your first shower and do not  sleep with pets. Day of Surgery : Do not apply any lotions/deodorants the morning of surgery.  Please wear clean clothes to the hospital/surgery center.  FAILURE TO FOLLOW THESE INSTRUCTIONS MAY RESULT IN THE CANCELLATION OF YOUR SURGERY  PATIENT SIGNATURE_________________________________  NURSE SIGNATURE__________________________________  ________________________________________________________________________   Adam Phenix  An incentive spirometer is a tool that can help keep your lungs clear and active. This tool measures how well you are filling your lungs with each breath. Taking long deep breaths may help reverse or decrease the chance of developing breathing (pulmonary) problems (especially infection) following:  A long period of time when you are unable to move or be active. BEFORE THE PROCEDURE   If the spirometer includes an indicator to show your best effort, your nurse or respiratory therapist will set it to a desired goal.  If possible, sit up straight or lean slightly forward. Try not to slouch.  Hold the incentive spirometer in an upright position. INSTRUCTIONS FOR USE  1. Sit on the edge of your bed if possible, or sit up as far as you can in bed or on a chair. 2. Hold the incentive spirometer in an upright position. 3. Breathe out normally. 4. Place the mouthpiece  in your mouth and seal your lips tightly around it. 5. Breathe in slowly and as deeply as possible, raising the piston or the ball toward the top of the column. 6. Hold your breath for 3-5 seconds or for as long as possible. Allow the piston or ball to fall to the bottom of the  column. 7. Remove the mouthpiece from your mouth and breathe out normally. 8. Rest for a few seconds and repeat Steps 1 through 7 at least 10 times every 1-2 hours when you are awake. Take your time and take a few normal breaths between deep breaths. 9. The spirometer may include an indicator to show your best effort. Use the indicator as a goal to work toward during each repetition. 10. After each set of 10 deep breaths, practice coughing to be sure your lungs are clear. If you have an incision (the cut made at the time of surgery), support your incision when coughing by placing a pillow or rolled up towels firmly against it. Once you are able to get out of bed, walk around indoors and cough well. You may stop using the incentive spirometer when instructed by your caregiver.  RISKS AND COMPLICATIONS  Take your time so you do not get dizzy or light-headed.  If you are in pain, you may need to take or ask for pain medication before doing incentive spirometry. It is harder to take a deep breath if you are having pain. AFTER USE  Rest and breathe slowly and easily.  It can be helpful to keep track of a log of your progress. Your caregiver can provide you with a simple table to help with this. If you are using the spirometer at home, follow these instructions: [place] IF:   You are having difficultly using the spirometer.  You have trouble using the spirometer as often as instructed.  Your pain medication is not giving enough relief while using the spirometer.  You develop fever of 100.5 F (38.1 C) or higher. SEEK IMMEDIATE MEDICAL CARE IF:   You cough up bloody sputum that had not been present before.  You develop fever of 102 F (38.9 C) or greater.  You develop worsening pain at or near the incision site. MAKE SURE YOU:   Understand these instructions.  Will watch your condition.  Will get help right away if you are not doing well or get worse. Document Released:  08/19/2006 Document Revised: 07/01/2011 Document Reviewed: 10/20/2006 [facility] Patient Information 2014 [place], [place].   ________________________________________________________________________

## 2020-08-25 NOTE — Progress Notes (Addendum)
COVID Vaccine Completed: x3 Date COVID Vaccine completed: 06-03-19, 06-29-19 Has received booster:  02-14-20 COVID vaccine manufacturer:    Moderna     Date of COVID positive in last 90 days:  N/A  PCP - Prince Solian, MD Cardiologist - N/A  Chest x-ray - N/A EKG - 08-28-20 Epic Stress Test - N/A ECHO - N/A Cardiac Cath - N/A Pacemaker/ICD device last checked: Spinal Cord Stimulator:  Sleep Study -  CPAP -   Fasting Blood Sugar - N/A Checks Blood Sugar _____ times a day  Blood Thinner Instructions: Aspirin Instructions:  ASA 81 mg.  Pt to stop one week prior per patient Last Dose:  Activity level:  Can go up a flight of stairs and perform activities of daily living without stopping and without symptoms of chest pain or shortness of breath.     Anesthesia review: Heart murmur was selected in history but patient states that she has never been told that she has a heart murmur and has not had any testing to evaluate a murmur.    Patient denies shortness of breath, fever, cough and chest pain at PAT appointment   Patient verbalized understanding of instructions that were given to them at the PAT appointment. Patient was also instructed that they will need to review over the PAT instructions again at home before surgery.

## 2020-08-28 ENCOUNTER — Encounter (HOSPITAL_COMMUNITY): Payer: Self-pay

## 2020-08-28 ENCOUNTER — Encounter (HOSPITAL_COMMUNITY)
Admission: RE | Admit: 2020-08-28 | Discharge: 2020-08-28 | Disposition: A | Discharge status: Home / Self Care | Payer: Medicare PPO | Source: Ambulatory Visit | Attending: Orthopedic Surgery | Admitting: Orthopedic Surgery

## 2020-08-28 ENCOUNTER — Other Ambulatory Visit: Payer: Self-pay

## 2020-08-28 DIAGNOSIS — Z01818 Encounter for other preprocedural examination: Secondary | ICD-10-CM | POA: Insufficient documentation

## 2020-08-28 HISTORY — DX: Gastro-esophageal reflux disease without esophagitis: K21.9

## 2020-08-28 HISTORY — DX: Malignant (primary) neoplasm, unspecified: C80.1

## 2020-08-28 HISTORY — DX: Nausea with vomiting, unspecified: R11.2

## 2020-08-28 HISTORY — DX: Other specified postprocedural states: Z98.890

## 2020-08-28 HISTORY — DX: Unspecified malignant neoplasm of skin, unspecified: C44.90

## 2020-08-28 LAB — BASIC METABOLIC PANEL
Anion gap: 10 (ref 5–15)
BUN: 25 mg/dL — ABNORMAL HIGH (ref 8–23)
CO2: 27 mmol/L (ref 22–32)
Calcium: 9.3 mg/dL (ref 8.9–10.3)
Chloride: 100 mmol/L (ref 98–111)
Creatinine, Ser: 0.65 mg/dL (ref 0.44–1.00)
GFR, Estimated: >60 mL/min (ref >60)
Glucose, Bld: 99 mg/dL (ref 70–99)
Potassium: 4 mmol/L (ref 3.5–5.1)
Sodium: 137 mmol/L (ref 135–145)

## 2020-08-28 LAB — CBC
HCT: 36.2 % (ref 36.0–46.0)
Hemoglobin: 12 g/dL (ref 12.0–15.0)
MCH: 31.3 pg (ref 26.0–34.0)
MCHC: 33.1 g/dL (ref 30.0–36.0)
MCV: 94.3 fL (ref 80.0–100.0)
Platelets: 245 10*3/uL (ref 150–400)
RBC: 3.84 MIL/uL — ABNORMAL LOW (ref 3.87–5.11)
RDW: 13.3 % (ref 11.5–15.5)
WBC: 5.8 10*3/uL (ref 4.0–10.5)
nRBC: 0 % (ref 0.0–0.2)

## 2020-08-28 LAB — SURGICAL PCR SCREEN
MRSA, PCR: NEGATIVE
Staphylococcus aureus: NEGATIVE

## 2020-09-06 ENCOUNTER — Encounter (HOSPITAL_COMMUNITY): Payer: Self-pay | Admitting: Orthopedic Surgery

## 2020-09-07 ENCOUNTER — Encounter (HOSPITAL_COMMUNITY)
Admission: RE | Disposition: A | Discharge status: Home / Self Care | Payer: Self-pay | Source: Home / Self Care | Attending: Orthopedic Surgery

## 2020-09-07 ENCOUNTER — Ambulatory Visit (HOSPITAL_COMMUNITY): Payer: Medicare PPO | Admitting: Anesthesiology

## 2020-09-07 ENCOUNTER — Encounter (HOSPITAL_COMMUNITY): Payer: Self-pay | Admitting: Orthopedic Surgery

## 2020-09-07 ENCOUNTER — Ambulatory Visit (HOSPITAL_COMMUNITY)
Admission: RE | Admit: 2020-09-07 | Discharge: 2020-09-07 | Disposition: A | Discharge status: Home / Self Care | Payer: Medicare PPO | Attending: Orthopedic Surgery | Admitting: Orthopedic Surgery

## 2020-09-07 ENCOUNTER — Ambulatory Visit (HOSPITAL_COMMUNITY): Payer: Medicare PPO | Admitting: Physician Assistant

## 2020-09-07 DIAGNOSIS — I10 Essential (primary) hypertension: Secondary | ICD-10-CM | POA: Diagnosis not present

## 2020-09-07 DIAGNOSIS — Z79899 Other long term (current) drug therapy: Secondary | ICD-10-CM | POA: Diagnosis not present

## 2020-09-07 DIAGNOSIS — G8918 Other acute postprocedural pain: Secondary | ICD-10-CM | POA: Diagnosis not present

## 2020-09-07 DIAGNOSIS — K219 Gastro-esophageal reflux disease without esophagitis: Secondary | ICD-10-CM | POA: Diagnosis not present

## 2020-09-07 DIAGNOSIS — M75101 Unspecified rotator cuff tear or rupture of right shoulder, not specified as traumatic: Secondary | ICD-10-CM | POA: Insufficient documentation

## 2020-09-07 DIAGNOSIS — Z7982 Long term (current) use of aspirin: Secondary | ICD-10-CM | POA: Diagnosis not present

## 2020-09-07 DIAGNOSIS — Z85828 Personal history of other malignant neoplasm of skin: Secondary | ICD-10-CM | POA: Diagnosis not present

## 2020-09-07 DIAGNOSIS — Z791 Long term (current) use of non-steroidal anti-inflammatories (NSAID): Secondary | ICD-10-CM | POA: Diagnosis not present

## 2020-09-07 DIAGNOSIS — Z79890 Hormone replacement therapy (postmenopausal): Secondary | ICD-10-CM | POA: Insufficient documentation

## 2020-09-07 DIAGNOSIS — M19011 Primary osteoarthritis, right shoulder: Secondary | ICD-10-CM | POA: Diagnosis not present

## 2020-09-07 HISTORY — PX: REVERSE SHOULDER ARTHROPLASTY: SHX5054

## 2020-09-07 LAB — TYPE AND SCREEN
ABO/RH(D): A POS
Antibody Screen: NEGATIVE

## 2020-09-07 LAB — ABO/RH: ABO/RH(D): A POS

## 2020-09-07 SURGERY — ARTHROPLASTY, SHOULDER, TOTAL, REVERSE
Anesthesia: Regional | Site: Shoulder | Laterality: Right

## 2020-09-07 MED ORDER — LACTATED RINGERS IV SOLN: INTRAVENOUS | Status: DC

## 2020-09-07 MED ORDER — TRANEXAMIC ACID-NACL 1000-0.7 MG/100ML-% IV SOLN
1000.0000 mg | INTRAVENOUS | Status: AC
  Administered 2020-09-07: 1000 mg via INTRAVENOUS
  Filled 2020-09-07: qty 100

## 2020-09-07 MED ORDER — TIZANIDINE HCL 4 MG PO TABS: 2.0000 mg | ORAL_TABLET | Freq: Four times a day (QID) | ORAL | 1 refills | Status: DC | PRN

## 2020-09-07 MED ORDER — PROPOFOL 10 MG/ML IV BOLUS
INTRAVENOUS | Status: DC | PRN
  Administered 2020-09-07: 130 mg via INTRAVENOUS

## 2020-09-07 MED ORDER — FENTANYL CITRATE (PF) 100 MCG/2ML IJ SOLN
INTRAMUSCULAR | Status: DC | PRN
  Administered 2020-09-07: 50 ug via INTRAVENOUS

## 2020-09-07 MED ORDER — ONDANSETRON HCL 4 MG PO TABS: 4.0000 mg | ORAL_TABLET | Freq: Three times a day (TID) | ORAL | 0 refills | Status: DC | PRN

## 2020-09-07 MED ORDER — MIDAZOLAM HCL 2 MG/2ML IJ SOLN
1.0000 mg | INTRAMUSCULAR | Status: DC
  Administered 2020-09-07: 1 mg via INTRAVENOUS
  Filled 2020-09-07: qty 2

## 2020-09-07 MED ORDER — ONDANSETRON HCL 4 MG/2ML IJ SOLN
INTRAMUSCULAR | Status: DC | PRN
  Administered 2020-09-07: 4 mg via INTRAVENOUS

## 2020-09-07 MED ORDER — VANCOMYCIN HCL 1000 MG IV SOLR
INTRAVENOUS | Status: AC
  Filled 2020-09-07: qty 1000

## 2020-09-07 MED ORDER — ACETAMINOPHEN 500 MG PO TABS
1000.0000 mg | ORAL_TABLET | Freq: Once | ORAL | Status: AC
  Administered 2020-09-07: 1000 mg via ORAL
  Filled 2020-09-07: qty 2

## 2020-09-07 MED ORDER — BUPIVACAINE LIPOSOME 1.3 % IJ SUSP
INTRAMUSCULAR | Status: DC | PRN
  Administered 2020-09-07: 10 mL via PERINEURAL

## 2020-09-07 MED ORDER — LIDOCAINE 2% (20 MG/ML) 5 ML SYRINGE
INTRAMUSCULAR | Status: DC | PRN
  Administered 2020-09-07: 60 mg via INTRAVENOUS

## 2020-09-07 MED ORDER — METOCLOPRAMIDE HCL 5 MG/ML IJ SOLN: 5.0000 mg | Freq: Three times a day (TID) | INTRAMUSCULAR | Status: DC | PRN

## 2020-09-07 MED ORDER — METOCLOPRAMIDE HCL 5 MG PO TABS
5.0000 mg | ORAL_TABLET | Freq: Three times a day (TID) | ORAL | Status: DC | PRN
  Filled 2020-09-07: qty 2

## 2020-09-07 MED ORDER — PHENYLEPHRINE HCL-NACL 10-0.9 MG/250ML-% IV SOLN
INTRAVENOUS | Status: DC | PRN
  Administered 2020-09-07: 45 ug/min via INTRAVENOUS

## 2020-09-07 MED ORDER — FENTANYL CITRATE (PF) 100 MCG/2ML IJ SOLN
INTRAMUSCULAR | Status: AC
  Filled 2020-09-07: qty 2

## 2020-09-07 MED ORDER — FENTANYL CITRATE (PF) 100 MCG/2ML IJ SOLN
50.0000 ug | INTRAMUSCULAR | Status: DC
  Administered 2020-09-07: 50 ug via INTRAVENOUS
  Filled 2020-09-07: qty 2

## 2020-09-07 MED ORDER — FENTANYL CITRATE (PF) 100 MCG/2ML IJ SOLN: 25.0000 ug | INTRAMUSCULAR | Status: DC | PRN

## 2020-09-07 MED ORDER — LACTATED RINGERS IV BOLUS
250.0000 mL | Freq: Once | INTRAVENOUS | Status: AC
  Administered 2020-09-07: 250 mL via INTRAVENOUS

## 2020-09-07 MED ORDER — MELOXICAM 15 MG PO TABS: 15.0000 mg | ORAL_TABLET | Freq: Every day | ORAL | 1 refills | Status: AC

## 2020-09-07 MED ORDER — ROCURONIUM BROMIDE 10 MG/ML (PF) SYRINGE
PREFILLED_SYRINGE | INTRAVENOUS | Status: DC | PRN
  Administered 2020-09-07: 70 mg via INTRAVENOUS

## 2020-09-07 MED ORDER — PROMETHAZINE HCL 25 MG/ML IJ SOLN: 6.2500 mg | INTRAMUSCULAR | Status: DC | PRN

## 2020-09-07 MED ORDER — OXYCODONE HCL 5 MG/5ML PO SOLN: 5.0000 mg | Freq: Once | ORAL | Status: DC | PRN

## 2020-09-07 MED ORDER — SUGAMMADEX SODIUM 200 MG/2ML IV SOLN
INTRAVENOUS | Status: DC | PRN
  Administered 2020-09-07: 200 mg via INTRAVENOUS

## 2020-09-07 MED ORDER — ORAL CARE MOUTH RINSE: 15.0000 mL | Freq: Once | OROMUCOSAL | Status: AC

## 2020-09-07 MED ORDER — SODIUM CHLORIDE 0.9 % IR SOLN
Status: DC | PRN
  Administered 2020-09-07: 1000 mL

## 2020-09-07 MED ORDER — DEXAMETHASONE SODIUM PHOSPHATE 10 MG/ML IJ SOLN
INTRAMUSCULAR | Status: DC | PRN
  Administered 2020-09-07: 8 mg via INTRAVENOUS

## 2020-09-07 MED ORDER — BUPIVACAINE HCL (PF) 0.5 % IJ SOLN
INTRAMUSCULAR | Status: DC | PRN
  Administered 2020-09-07: 15 mL

## 2020-09-07 MED ORDER — AMISULPRIDE (ANTIEMETIC) 5 MG/2ML IV SOLN: 10.0000 mg | Freq: Once | INTRAVENOUS | Status: DC | PRN

## 2020-09-07 MED ORDER — ONDANSETRON HCL 4 MG/2ML IJ SOLN: 4.0000 mg | Freq: Four times a day (QID) | INTRAMUSCULAR | Status: DC | PRN

## 2020-09-07 MED ORDER — OXYCODONE HCL 5 MG PO TABS
5.0000 mg | ORAL_TABLET | Freq: Once | ORAL | Status: DC | PRN
Start: 2020-09-07 — End: 2020-09-07

## 2020-09-07 MED ORDER — VANCOMYCIN HCL 1000 MG IV SOLR
INTRAVENOUS | Status: DC | PRN
  Administered 2020-09-07: 1000 mg via TOPICAL

## 2020-09-07 MED ORDER — LACTATED RINGERS IV BOLUS
500.0000 mL | Freq: Once | INTRAVENOUS | Status: AC
  Administered 2020-09-07: 500 mL via INTRAVENOUS

## 2020-09-07 MED ORDER — OXYCODONE-ACETAMINOPHEN 5-325 MG PO TABS: 1.0000 | ORAL_TABLET | ORAL | 0 refills | Status: DC | PRN

## 2020-09-07 MED ORDER — CHLORHEXIDINE GLUCONATE 0.12 % MT SOLN
15.0000 mL | Freq: Once | OROMUCOSAL | Status: AC
  Administered 2020-09-07: 15 mL via OROMUCOSAL

## 2020-09-07 MED ORDER — ONDANSETRON HCL 4 MG PO TABS
4.0000 mg | ORAL_TABLET | Freq: Four times a day (QID) | ORAL | Status: DC | PRN
  Filled 2020-09-07: qty 1

## 2020-09-07 MED ORDER — EPHEDRINE SULFATE-NACL 50-0.9 MG/10ML-% IV SOSY
PREFILLED_SYRINGE | INTRAVENOUS | Status: DC | PRN
  Administered 2020-09-07: 5 mg via INTRAVENOUS

## 2020-09-07 MED ORDER — CEFAZOLIN SODIUM-DEXTROSE 2-4 GM/100ML-% IV SOLN
2.0000 g | INTRAVENOUS | Status: AC
  Administered 2020-09-07: 2 g via INTRAVENOUS
  Filled 2020-09-07: qty 100

## 2020-09-07 SURGICAL SUPPLY — 53 items
BLADE SAW SGTL 83.5X18.5 (BLADE) ×2 IMPLANT
COOLER ICEMAN CLASSIC (MISCELLANEOUS) ×2 IMPLANT
COVER BACK TABLE 60X90IN (DRAPES) ×2 IMPLANT
COVER SURGICAL LIGHT HANDLE (MISCELLANEOUS) ×2 IMPLANT
CUP SUT UNIV REVERS 36 NEUTRAL (Cup) ×2 IMPLANT
DERMABOND ADVANCED (GAUZE/BANDAGES/DRESSINGS) ×1
DERMABOND ADVANCED .7 DNX12 (GAUZE/BANDAGES/DRESSINGS) ×1 IMPLANT
DRAPE ORTHO SPLIT 77X108 STRL (DRAPES) ×4
DRAPE SHEET LG 3/4 BI-LAMINATE (DRAPES) ×2 IMPLANT
DRAPE SURG 17X11 SM STRL (DRAPES) ×2 IMPLANT
DRAPE SURG ORHT 6 SPLT 77X108 (DRAPES) ×2 IMPLANT
DRAPE U-SHAPE 47X51 STRL (DRAPES) ×2 IMPLANT
DRSG AQUACEL AG ADV 3.5X 6 (GAUZE/BANDAGES/DRESSINGS) ×2 IMPLANT
DURAPREP 26ML APPLICATOR (WOUND CARE) ×2 IMPLANT
ELECT BLADE TIP CTD 4 INCH (ELECTRODE) ×2 IMPLANT
ELECT REM PT RETURN 15FT ADLT (MISCELLANEOUS) ×2 IMPLANT
FACESHIELD WRAPAROUND (MASK) ×8 IMPLANT
GLENOID UNI REV MOD 24 +2 LAT (Joint) ×2 IMPLANT
GLENOSPHERE 36 +4 LAT/24 (Joint) ×2 IMPLANT
GLOVE SRG 8 PF TXTR STRL LF DI (GLOVE) ×1 IMPLANT
GLOVE SURG ENC MOIS LTX SZ7 (GLOVE) ×2 IMPLANT
GLOVE SURG ENC MOIS LTX SZ7.5 (GLOVE) ×2 IMPLANT
GLOVE SURG UNDER POLY LF SZ7 (GLOVE) ×2 IMPLANT
GLOVE SURG UNDER POLY LF SZ8 (GLOVE) ×2
GOWN STRL REUS W/TWL LRG LVL3 (GOWN DISPOSABLE) ×4 IMPLANT
KIT BASIN OR (CUSTOM PROCEDURE TRAY) ×2 IMPLANT
KIT TURNOVER KIT A (KITS) ×2 IMPLANT
LINER HUMERAL 36 +3MM SM (Shoulder) ×2 IMPLANT
MANIFOLD NEPTUNE II (INSTRUMENTS) ×2 IMPLANT
NEEDLE TAPERED W/ NITINOL LOOP (MISCELLANEOUS) ×2 IMPLANT
NS IRRIG 1000ML POUR BTL (IV SOLUTION) ×2 IMPLANT
PACK SHOULDER (CUSTOM PROCEDURE TRAY) ×2 IMPLANT
PAD ARMBOARD 7.5X6 YLW CONV (MISCELLANEOUS) ×2 IMPLANT
PIN NITINOL TARGETER 2.8 (PIN) IMPLANT
PIN SET MODULAR GLENOID SYSTEM (PIN) IMPLANT
RESTRAINT HEAD UNIVERSAL NS (MISCELLANEOUS) ×2 IMPLANT
SCREW CENTRAL MODULAR 25 (Screw) ×2 IMPLANT
SCREW PERI LOCK 5.5X16 (Screw) ×4 IMPLANT
SCREW PERI LOCK 5.5X32 (Screw) ×2 IMPLANT
SCREW PERIPHERAL 5.5X20 LOCK (Screw) ×2 IMPLANT
STEM HUMERAL UNI REVERS SZ6 (Stem) ×2 IMPLANT
SUCTION FRAZIER HANDLE 12FR (TUBING) ×2
SUCTION TUBE FRAZIER 12FR DISP (TUBING) ×1 IMPLANT
SUT FIBERWIRE #2 38 T-5 BLUE (SUTURE)
SUT MNCRL AB 3-0 PS2 18 (SUTURE) ×2 IMPLANT
SUT MON AB 2-0 CT1 36 (SUTURE) ×2 IMPLANT
SUT VIC AB 1 CT1 36 (SUTURE) ×2 IMPLANT
SUTURE FIBERWR #2 38 T-5 BLUE (SUTURE) IMPLANT
SUTURE TAPE 1.3 40 TPR END (SUTURE) ×2 IMPLANT
SUTURETAPE 1.3 40 TPR END (SUTURE) ×4
TOWEL OR 17X26 10 PK STRL BLUE (TOWEL DISPOSABLE) ×2 IMPLANT
TOWEL OR NON WOVEN STRL DISP B (DISPOSABLE) ×2 IMPLANT
WATER STERILE IRR 1000ML POUR (IV SOLUTION) ×4 IMPLANT

## 2020-09-07 NOTE — H&P (Signed)
Natalie [name]    Chief Complaint: Right shoulder rotator cuff tear arthropathy HPI: The patient is a 75 y.o. female with chronic and progressively increasing right shoulder pain related to rotator cuff tear arthropathy.  Due to her increasing functional rotations and failure to respond to prolonged attempts at conservative management, patient is brought to the operating room at this time for planned right shoulder reverse arthroplasty  Past Medical History:  Diagnosis Date  . Arthritis   . Back pain   . Cancer ([place])   . Change in hearing   . Generalized headaches   . GERD (gastroesophageal reflux disease)   . Heart murmur    Pt denies  . Hyperlipidemia   . Hypertension   . Joint pain   . Leg pain   . Osteopenia 04/2015   T score -1.9 FRAX 12%/2.6% stable from prior DEXA  . PONV (postoperative nausea and vomiting)   . Skin cancer   . Varicose veins of leg with pain Left  > Right    Past Surgical History:  Procedure Laterality Date  . ABDOMINAL HYSTERECTOMY  1982   TAH BSO  . arm surgery  1998 right arm  . CESAREAN SECTION    . ECTOPIC PREGNANCY SURGERY    . ENDOVENOUS ABLATION SAPHENOUS VEIN W/ LASER  left leg 10 years ago per pt.  . EXPLORATORY TYMPANOTOMY    . FINGER SURGERY  2007  right hand 4th and 5th digits  . SHOULDER SURGERY  2019  . STAPEDECTOMY  2001  . STAPEDECTOMY  revision 2002  . TONSILLECTOMY  1954  . VEIN LIGATION AND STRIPPING  9-12   LEFT- RIGHT LEG INJECTED FOR SPIDER VEINS    Family History  Problem Relation Age of Onset  . Heart disease Mother   . Hypertension Mother   . Heart disease Father   . Hypertension Father     Social History:  reports that she has never smoked. She has never used smokeless tobacco. She reports that she does not drink alcohol and does not use drugs.   Medications Prior to Admission  Medication Sig Dispense Refill  . acetaminophen (TYLENOL) 325 MG tablet Take 650 mg by mouth every 6 (six) hours as needed for  moderate pain.    Marland Kitchen aspirin 81 MG tablet Take 81 mg by mouth daily.    Marland Kitchen atenolol-chlorthalidone (TENORETIC) 100-25 MG per tablet Take 0.5 tablets by mouth daily.    Marland Kitchen atorvastatin (LIPITOR) 20 MG tablet Take 20 mg by mouth at bedtime.    . Cholecalciferol (VITAMIN D) 125 MCG (5000 UT) CAPS Take 5,000 Units by mouth daily.    Marland Kitchen estradiol (ESTRACE) 0.5 MG tablet Take 0.25 mg by mouth every other day.    . fluticasone (FLONASE) 50 MCG/ACT nasal spray Place 1 spray into both nostrils at bedtime as needed for allergies or rhinitis.    Marland Kitchen ibuprofen (ADVIL) 200 MG tablet Take 400 mg by mouth every 6 (six) hours as needed for moderate pain.    Marland Kitchen losartan (COZAAR) 50 MG tablet Take 50 mg by mouth at bedtime.    . meloxicam (MOBIC) 15 MG tablet Take 1 tablet (15 mg total) by mouth daily. (Patient taking differently: Take 15 mg by mouth at bedtime.) 14 tablet 0  . mometasone (ELOCON) 0.1 % cream Apply 1 application topically daily as needed (itching).    Marland Kitchen omeprazole (PRILOSEC) 20 MG capsule TAKE 1 CAPSULE (20 MG TOTAL) TWO TIMES DAILY. (Patient taking differently: Take 20  mg by mouth in the morning and at bedtime.) 180 capsule 0  . pramipexole (MIRAPEX) 0.5 MG tablet Take 0.5 mg by mouth at bedtime.    Marland Kitchen tiZANidine (ZANAFLEX) 4 MG tablet Take 2 mg by mouth at bedtime.    Marland Kitchen ammonium lactate (AMLACTIN) 12 % cream Apply topically as needed for dry skin. (Patient not taking: Reported on 08/22/2020) 385 g 0  . nystatin (MYCOSTATIN/NYSTOP) powder Apply topically 2 (two) times daily. (Patient not taking: No sig reported) 30 g 0  . terbinafine (LAMISIL) 250 MG tablet Take 1 tablet (250 mg total) by mouth daily. (Patient not taking: No sig reported) 90 tablet 0  . terbinafine (LAMISIL) 250 MG tablet Take 1 tablet (250 mg total) by mouth daily. (Patient not taking: No sig reported) 90 tablet 0  . urea (GORDONS UREA) 40 % ointment Apply topically as needed. (Patient not taking: No sig reported) 30 g 0     Physical  Exam: Right shoulder demonstrates functional motion but severe pain with global weakness as noted at her recent office visits.  Recent MRI scan confirms a full-thickness and moderately retracted rotator cuff tear with significant rotator cuff tendinopathy.  Vitals  Temp:  [97.7 F (36.5 C)] 97.7 F (36.5 C) (05/19 0811) Pulse Rate:  [84] 84 (05/19 0811) Resp:  [20] 20 (05/19 0811) BP: (168)/(87) 168/87 (05/19 0811) SpO2:  [97 %] 97 % (05/19 0811)  Assessment/Plan  Impression: Right shoulder rotator cuff tear arthropathy  Plan of Action: Procedure(s): REVERSE SHOULDER ARTHROPLASTY  Elfreda Blanchet M Deiondre Harrower 09/07/2020, 9:20 AM Contact # [phone]

## 2020-09-07 NOTE — Anesthesia Postprocedure Evaluation (Signed)
Anesthesia Post Note  Patient: Natalie [name]  Procedure(s) Performed: REVERSE SHOULDER ARTHROPLASTY (Right Shoulder)     Patient location during evaluation: PACU Anesthesia Type: Regional and General Level of consciousness: awake and alert, oriented and patient cooperative Pain management: pain level controlled Vital Signs Assessment: post-procedure vital signs reviewed and stable Respiratory status: spontaneous breathing, nonlabored ventilation and respiratory function stable Cardiovascular status: blood pressure returned to baseline and stable Postop Assessment: no apparent nausea or vomiting Anesthetic complications: no   No complications documented.  Last Vitals:  Vitals:   09/07/20 0951 09/07/20 1145  BP: (!) 167/85 (!) 172/84  Pulse: 69 66  Resp: 18 15  Temp:  (!) 36.4 C  SpO2: 99% 99%    Last Pain:  Vitals:   09/07/20 1145  TempSrc:   PainSc: 0-No pain                 Pervis Hocking

## 2020-09-07 NOTE — Anesthesia Procedure Notes (Signed)
Procedure Name: Intubation Date/Time: 09/07/2020 10:21 AM Performed by: Lavina Hamman, CRNA Pre-anesthesia Checklist: Patient identified, Emergency Drugs available, Suction available, Patient being monitored and Timeout performed Patient Re-evaluated:Patient Re-evaluated prior to induction Oxygen Delivery Method: Circle system utilized Preoxygenation: Pre-oxygenation with 100% oxygen Induction Type: IV induction Ventilation: Mask ventilation without difficulty Laryngoscope Size: Mac and 3 Grade View: Grade I Tube type: Oral Tube size: 7.0 mm Number of attempts: 1 Airway Equipment and Method: Stylet Placement Confirmation: ETT inserted through vocal cords under direct vision,  positive ETCO2,  CO2 detector and breath sounds checked- equal and bilateral Secured at: 21 cm Tube secured with: Tape Dental Injury: Teeth and Oropharynx as per pre-operative assessment  Comments: ATOI

## 2020-09-07 NOTE — Evaluation (Signed)
Occupational Therapy Evaluation Patient Details Name: Natalie [name] MRN: [id number] DOB: 1946/01/16 Today's Date: 09/07/2020    History of Present Illness Patient s/p R rTSA   Clinical Impression   Natalie [name] is a 75 year old woman s/p reverse shoulder replacement without functional use of right non-dominant upper extremity secondary to effects of surgery, interscalene block and shoulder precautions. Therapist provided education and instruction to patient and spouse in regards to exercises, precautions, positioning, donning upper extremity clothing and bathing while maintaining shoulder precautions, ice and edema management with cooler and cuff and donning/doffing sling. Patient and spouse verbalized understanding. Patient needed assistance to donn shirt, underwear, and pants and provided with instruction on compensatory strategies to perform ADLs. Patient provided with handouts to maximize retention of instruction. Patient to follow up with MD for further therapy needs.      Follow Up Recommendations  Follow surgeon's recommendation for DC plan and follow-up therapies    Equipment Recommendations  None recommended by OT    Recommendations for Other Services       Precautions / Restrictions Precautions Precautions: Shoulder Type of Shoulder Precautions: If sitting in controlled environment, ok to come out of sling to give neck a break. Please sleep in it to protect until follow up in office.     OK to use operative arm for feeding, hygiene and ADLs.   Ok to instruct Pendulums and lap slides as exercises. Ok to use operative arm within the following parameters for ADL purposes     New ROM (8/18)   Ok for PROM, AAROM, AROM within pain tolerance and within the following ROM   ER 20   ABD 45   FE 60 Shoulder Interventions: At all times;Off for dressing/bathing/exercises Precaution Booklet Issued:  (handouts) Required Braces or Orthoses: Sling Restrictions Weight Bearing  Restrictions: Yes RUE Weight Bearing: Non weight bearing      Mobility Bed Mobility                    Transfers                      Balance Overall balance assessment: No apparent balance deficits (not formally assessed)                                         ADL either performed or assessed with clinical judgement   ADL Overall ADL's : Needs assistance/impaired Eating/Feeding: Set up   Grooming: Modified independent   Upper Body Bathing: Minimal assistance;Sitting   Lower Body Bathing: Sit to/from stand;Modified independent   Upper Body Dressing : Moderate assistance;Adhering to UE precautions   Lower Body Dressing: Minimal assistance;Sit to/from stand   Toilet Transfer: Min guard   [place] and Hygiene: Supervision/safety;Sit to/from stand               Vision Patient Visual Report: No change from baseline       Perception     Praxis      Pertinent Vitals/Pain Pain Assessment: No/denies pain (block in effect)     Hand Dominance Left   Extremity/Trunk Assessment Upper Extremity Assessment Upper Extremity Assessment: RUE deficits/detail RUE Deficits / Details: gross abilty to open and close fingers and control hand otherwise non functional upper extremity secondary to effects of interscalene block. RUE Sensation: decreased light touch (numb) RUE Coordination: decreased fine motor;decreased  gross motor   Lower Extremity Assessment Lower Extremity Assessment: Overall WFL for tasks assessed   Cervical / Trunk Assessment Cervical / Trunk Assessment: Normal   Communication Communication Communication: No difficulties   Cognition Arousal/Alertness: Awake/alert Behavior During Therapy: WFL for tasks assessed/performed Overall Cognitive Status: Within Functional Limits for tasks assessed                                     General Comments       Exercises     Shoulder  Instructions Shoulder Instructions Donning/doffing shirt without moving shoulder: Caregiver independent with task Method for sponge bathing under operated UE: Independent Donning/doffing sling/immobilizer: Caregiver independent with task Correct positioning of sling/immobilizer: Independent Pendulum exercises (written home exercise program): Caregiver independent with task ROM for elbow, wrist and digits of operated UE: Independent Sling wearing schedule (on at all times/off for ADL's): Independent Proper positioning of operated UE when showering: Independent Dressing change: Independent Positioning of UE while sleeping: [place] expects to be discharged to:: Private residence Living Arrangements: Spouse/significant other Available Help at Discharge: Family;Available 24 hours/day Type of Home: House                                  Prior Functioning/Environment Level of Independence: Independent                 OT Problem List: Decreased strength;Decreased range of motion;Impaired UE functional use;Pain      OT Treatment/Interventions:      OT Goals(Current goals can be found in the care plan section) Acute Rehab OT Goals OT Goal Formulation: All assessment and education complete, DC therapy  OT Frequency:     Barriers to D/C:            Co-evaluation              AM-PAC OT "6 Clicks" Daily Activity     Outcome Measure Help from another person eating meals?: A Little Help from another person taking care of personal grooming?: A Little Help from another person toileting, which includes using toliet, bedpan, or urinal?: A Little Help from another person bathing (including washing, rinsing, drying)?: A Little Help from another person to put on and taking off regular upper body clothing?: A Lot Help from another person to put on and taking off regular lower body clothing?: A Little 6 Click Score: 17   End of  Session Nurse Communication:  (Ot education complete)  Activity Tolerance: Patient tolerated treatment well Patient left: in chair;with family/visitor present  OT Visit Diagnosis: Pain Pain - Right/Left: Right Pain - part of body: Shoulder                Time: 1430-1457 OT Time Calculation (min): 27 min Charges:  OT General Charges $OT Visit: 1 Visit OT Evaluation $OT Eval Low Complexity: 1 Low OT Treatments $Self Care/Home Management : 8-22 mins  Axell Trigueros, OTR/L [place]  Office [phone] Pager: [place] 09/07/2020, 3:41 PM

## 2020-09-07 NOTE — Op Note (Signed)
09/07/2020  11:20 AM  PATIENT:   Natalie [name]  75 y.o. female  PRE-OPERATIVE DIAGNOSIS:  Right shoulder rotator cuff tear arthropathy  POST-OPERATIVE DIAGNOSIS: Same  PROCEDURE: Right shoulder reverse arthroplasty utilizing a press-fit size 6 Arthrex stem with a neutral metaphysis, +3 polyethylene insert, 36/+4 glenosphere on a small/+2 baseplate  SURGEON:  [employer], Metta Clines M.D.  ASSISTANTS: Jenetta Loges, PA-C  ANESTHESIA:   General endotracheal and interscalene block with Exparel  EBL: 100 cc  SPECIMEN: None  Drains: None   PATIENT DISPOSITION:  PACU - hemodynamically stable.    PLAN OF CARE: Discharge to home after PACU  Brief history:  Natalie [name] is a 75 year old female who has been followed for chronic and progressively increasing right shoulder pain due to a chronic rotator cuff tear with diffuse rotator cuff tendinopathy.  Her MRI scan demonstrates severely diseased tendon with some early degenerative chondrosis of the glenohumeral joint.  Due to her ongoing pain and functional limitations and failure to respond to prolonged attempts at conservative management she is brought to the operating this time for planned right shoulder reverse arthroplasty.  Preoperatively, I counseled the patient regarding treatment options and risks versus benefits thereof.  Possible surgical complications were all reviewed including potential for bleeding, infection, neurovascular injury, persistent pain, loss of motion, anesthetic complication, failure of the implant, and possible need for additional surgery. They understand and accept and agrees with our planned procedure.   Procedure in detail:  After undergoing routine preop evaluation the patient received prophylactic antibiotics and an interscalene block with Exparel was established in the holding area by the anesthesia department.  Patient was subsequently placed supine on the operating table and underwent the smooth induction  of a general endotracheal anesthesia.  Placed into the beachchair position and appropriately padded and protected.  The right shoulder girdle region was sterilely prepped and draped in standard fashion.  Timeout was called.  A deltopectoral approach to the right shoulder was made through an approximate centimeter incision.  Skin flaps were elevated dissection carried deeply and the deltopectoral interval was then developed from proximal to distal with the vein taken laterally.  The upper centimeter the pectoralis major tendon was tenotomized for exposure.  The long head biceps tendon was then tenodesed at the upper border the pectoralis major with the proximal segment then unroofed and excised.  The rotator cuff was then split from the apex of the bicipital groove to the base of the coracoid and the subscapularis was then separated from the lesser tuberosity and tagged with a pair of suture tape sutures.  Rotator cuff was deficient superiorly.  The capsular attachments were then divided from the anterior and infra margins of the humeral neck and the humeral head was then delivered through the wound.  Extra medullary guide was then used to outline the proposed humeral head resection which was performed with an oscillating saw at approximately 20 degrees retroversion.  A metal cap was then placed over the cut proximal humeral surface and peripheral osteophytes were removed with a rondure.  The glenoid was then exposed with appropriate retractors and a circumferential labral resection was then completed.  A guidepin was then directed into the center of the glenoid with an approximately 10 degree inferior tilt and the glenoid was then reamed with the central followed by the peripheral reamers to a stable subchondral bony bed.  Preparation completed with the central drill and tapped for a 25 mm lag screw.  Baseplate was then assembled and  inserted with excellent purchase and fixation and vancomycin powder had been  applied to the lag screw threads prior to insertion.  The peripheral locking screws were all then placed using standard technique.  Good purchase and fixation was achieved.  A 36/+4 glenosphere was then impacted onto the baseplate and the central locking screw was placed.  We then returned our attention to the proximal humerus where the canal was opened hand reaming to size 6 broaching to size 6 at approximate 20 degrees of retroversion.  Metaphyseal reaming was then completed.  Trial reduction showed good motion good stability good soft tissue balance.  Trial was then removed and the final implant was assembled on the back table.  Vancomycin powder was then spread liberally into the humeral canal and the final implant was then seated with excellent purchase and fixation.  Trial reduction was performed and again felt that the +3 polyethylene insert gave [place] the best soft tissue balance with good motion and good stability.  The trial polywas removed and the final polyp was then impacted onto the implant after it was cleaned and dried.  A final reduction was then performed which again showed good motion good stability good soft tissue balance.  Copious irrigation was then completed.  We confirmed good mobility of the subscapularis and this was then repaired back to the eyelets on the collar of the implant using the previously placed suture tape and FiberWire suture.  The arm easily achieved 45 degrees of external rotation without excessive tension on the subscap.  Final irrigation was then completed.  Hemostasis was obtained.  The balance of the vancomycin powder was then spread liberally throughout the deep soft tissue layers.  The deltopectoral interval was then reapproximated with a series of figure-of-eight and 1 Vicryl sutures.  2-0 Monocryl used to the subcu layer and intracuticular 3-0 Monocryl for the skin followed by Dermabond and Aquacel dressing.  The right arm was then placed into a sling.  Patient was  awakened, extubated, and taken to the recovery room in stable condition.  Jenetta Loges, PA-C was utilized as an Environmental consultant throughout this case, essential for help with positioning the patient, positioning extremity, tissue manipulation, implantation of the prosthesis, suture management, wound closure, and intraoperative decision-making.  Marin Shutter MD     Contact # [phone]

## 2020-09-07 NOTE — Anesthesia Preprocedure Evaluation (Addendum)
Anesthesia Evaluation  Patient identified by MRN, date of birth, ID band Patient awake    Reviewed: Allergy & Precautions, NPO status , Patient's Chart, lab work & pertinent test results, reviewed documented beta blocker date and time   History of Anesthesia Complications Negative for: history of anesthetic complications (no PONV with last surgery)  Airway Mallampati: III  TM Distance: >3 FB Neck ROM: Full    Dental  (+) Teeth Intact, Dental Advisory Given   Pulmonary  Snores at night, never had sleep study   Pulmonary exam normal breath sounds clear to auscultation       Cardiovascular hypertension (168/87 in preop), Pt. on medications and Pt. on home beta blockers Normal cardiovascular exam Rhythm:Regular Rate:Normal     Neuro/Psych  Headaches, negative psych ROS   GI/Hepatic Neg liver ROS, GERD  Medicated and Controlled,  Endo/Other  negative endocrine ROS  Renal/GU negative Renal ROS  negative genitourinary   Musculoskeletal  (+) Arthritis , Osteoarthritis,  Chronic back pain Right shoulder rotator cuff tear   Abdominal   Peds  Hematology negative hematology ROS (+) hct 36.2   Anesthesia Other Findings   Reproductive/Obstetrics negative OB ROS                            Anesthesia Physical Anesthesia Plan  ASA: II  Anesthesia Plan: General and Regional   Post-op Pain Management: GA combined w/ Regional for post-op pain   Induction: Intravenous  PONV Risk Score and Plan: 4 or greater and Ondansetron, Dexamethasone, Midazolam and Treatment may vary due to age or medical condition  Airway Management Planned: Oral ETT  Additional Equipment: None  Intra-op Plan:   Post-operative Plan: Extubation in OR  Informed Consent: I have reviewed the patients History and Physical, chart, labs and discussed the procedure including the risks, benefits and alternatives for the proposed  anesthesia with the patient or authorized representative who has indicated his/her understanding and acceptance.     Dental advisory given  Plan Discussed with: CRNA  Anesthesia Plan Comments:        Anesthesia Quick Evaluation

## 2020-09-07 NOTE — Anesthesia Procedure Notes (Signed)
Anesthesia Regional Block: Interscalene brachial plexus block   Pre-Anesthetic Checklist: ,, timeout performed, Correct Patient, Correct Site, Correct Laterality, Correct Procedure, Correct Position, site marked, Risks and benefits discussed,  Surgical consent,  Pre-op evaluation,  At surgeon's request and post-op pain management  Laterality: Right  Prep: Maximum Sterile Barrier Precautions used, chloraprep       Needles:  Injection technique: Single-shot  Needle Type: Echogenic Stimulator Needle     Needle Length: 9cm  Needle Gauge: 22     Additional Needles:   Procedures:,,,, ultrasound used (permanent image in chart),,,,  Narrative:  Start time: 09/07/2020 9:45 AM End time: 09/07/2020 9:50 AM Injection made incrementally with aspirations every 5 mL.  Performed by: Personally  Anesthesiologist: Pervis Hocking, DO  Additional Notes: Monitors applied. No increased pain on injection. No increased resistance to injection. Injection made in 5cc increments. Good needle visualization. Patient tolerated procedure well.

## 2020-09-07 NOTE — Transfer of Care (Signed)
Immediate Anesthesia Transfer of Care Note  Patient: Natalie [name]  Procedure(s) Performed: Procedure(s) with comments: REVERSE SHOULDER ARTHROPLASTY (Right) - 167min  Patient Location: PACU  Anesthesia Type:General  Level of Consciousness:  sedated, patient cooperative and responds to stimulation  Airway & Oxygen Therapy:Patient Spontanous Breathing and Patient connected to face mask oxgen  Post-op Assessment:  Report given to PACU RN and Post -op Vital signs reviewed and stable  Post vital signs:  Reviewed and stable  Last Vitals:  Vitals:   09/07/20 0951 09/07/20 1145  BP: (!) 167/85 (!) 172/84  Pulse: 69 66  Resp: 18 15  Temp:  (!) 36.4 C  SpO2: 66% 29%    Complications: No apparent anesthesia complications

## 2020-09-07 NOTE — Discharge Instructions (Signed)
 Kevin M. Supple, M.D., F.A.A.O.S. Orthopaedic Surgery Specializing in Arthroscopic and Reconstructive Surgery of the Shoulder [phone] [address] - [place], [place] [address] - Fax [phone]   POST-OP TOTAL SHOULDER REPLACEMENT INSTRUCTIONS  1. Follow up in the office for your first post-op appointment 10-14 days from the date of your surgery. If you do not already have a scheduled appointment, our office will contact you to schedule.  2. The bandage over your incision is waterproof. You may begin showering with this dressing on. You may leave this dressing on until first follow up appointment within 2 weeks. We prefer you leave this dressing in place until follow up however after 5-7 days if you are having itching or skin irritation and would like to remove it you may do so. Go slow and tug at the borders gently to break the bond the dressing has with the skin. At this point if there is no drainage it is okay to go without a bandage or you may cover it with a light guaze and tape. You can also expect significant bruising around your shoulder that will drift down your arm and into your chest wall. This is very normal and should resolve over several days.   3. Wear your sling/immobilizer at all times except to perform the exercises below or to occasionally let your arm dangle by your side to stretch your elbow. You also need to sleep in your sling immobilizer until instructed otherwise. It is ok to remove your sling if you are sitting in a controlled environment and allow your arm to rest in a position of comfort by your side or on your lap with pillows to give your neck and skin a break from the sling. You may remove it to allow arm to dangle by side to shower. If you are up walking around and when you go to sleep at night you need to wear it.  4. Range of motion to your elbow, wrist, and hand are encouraged 3-5 times daily. Exercise to your hand and fingers helps to reduce  swelling you may experience.   5. Prescriptions for a pain medication and a muscle relaxant are provided for you. It is recommended that if you are experiencing pain that you pain medication alone is not controlling, add the muscle relaxant along with the pain medication which can give additional pain relief. The first 1-2 days is generally the most severe of your pain and then should gradually decrease. As your pain lessens it is recommended that you decrease your use of the pain medications to an "as needed basis'" only and to always comply with the recommended dosages of the pain medications.  6. Pain medications can produce constipation along with their use. If you experience this, the use of an over the counter stool softener or laxative daily is recommended.   7. For additional questions or concerns, please do not hesitate to call the office. If after hours there is an answering service to forward your concerns to the physician on call.  8.Pain control following an exparel block  To help control your post-operative pain you received a nerve block  performed with Exparel which is a long acting anesthetic (numbing agent) which can provide pain relief and sensations of numbness (and relief of pain) in the operative shoulder and arm for up to 3 days. Sometimes it provides mixed relief, meaning you may still have numbness in certain areas of the arm but can still be able to   move  parts of that arm, hand, and fingers. We recommend that your prescribed pain medications  be used as needed. We do not feel it is necessary to "pre medicate" and "stay ahead" of pain.  Taking narcotic pain medications when you are not having any pain can lead to unnecessary and potentially dangerous side effects.    9. Use the ice machine as much as possible in the first 5-7 days from surgery, then you can wean its use to as needed. The ice typically needs to be replaced every 6 hours, instead of ice you can actually freeze  water bottles to put in the cooler and then fill water around them to avoid having to purchase ice. You can have spare water bottles freezing to allow you to rotate them once they have melted. Try to have a thin shirt or light cloth or towel under the ice wrap to protect your skin.   FOR ADDITIONAL INFO ON ICE MACHINE AND INSTRUCTIONS GO TO THE WEBSITE AT  https://[email or web address].com/products/donjoy/[email or web address]-iceman-[email or web address]  10.  We recommend that you avoid any dental work or cleaning in the first 3 months following your joint replacement. This is to help minimize the possibility of infection from the bacteria in your mouth that enters your bloodstream during dental work. We also recommend that you take an antibiotic prior to your dental work for the first year after your shoulder replacement to further help reduce that risk. Please simply contact our office for antibiotics to be sent to your pharmacy prior to dental work.  11. Dental Antibiotics:  In most cases prophylactic antibiotics for Dental procdeures after total joint surgery are not necessary.  Exceptions are as follows:  1. History of prior total joint infection  2. Severely immunocompromised (Organ Transplant, cancer chemotherapy, Rheumatoid biologic meds such as Humera)  3. Poorly controlled diabetes (A1C &gt; 8.0, blood glucose over 200)  If you have one of these conditions, contact your surgeon for an antibiotic prescription, prior to your dental procedure.   POST-OP EXERCISES  Pendulum Exercises  Perform pendulum exercises while standing and bending at the waist. Support your uninvolved arm on a table or chair and allow your operated arm to hang freely. Make sure to do these exercises passively - not using you shoulder muscles. These exercises can be performed once your nerve block effects have worn off.  Repeat 20 times. Do 3 sessions per day.     

## 2020-09-07 NOTE — Progress Notes (Signed)
Assisted Dr. Beth Finucane with right, ultrasound guided, interscalene  block. Side rails up, monitors on throughout procedure. See vital signs in flow sheet. Tolerated Procedure well.  

## 2020-09-08 ENCOUNTER — Encounter (HOSPITAL_COMMUNITY): Payer: Self-pay | Admitting: Orthopedic Surgery

## 2020-09-20 DIAGNOSIS — Z96611 Presence of right artificial shoulder joint: Secondary | ICD-10-CM | POA: Diagnosis not present

## 2020-09-20 DIAGNOSIS — Z471 Aftercare following joint replacement surgery: Secondary | ICD-10-CM | POA: Diagnosis not present

## 2020-10-04 DIAGNOSIS — M25511 Pain in right shoulder: Secondary | ICD-10-CM | POA: Diagnosis not present

## 2020-10-13 DIAGNOSIS — M25511 Pain in right shoulder: Secondary | ICD-10-CM | POA: Diagnosis not present

## 2020-10-18 DIAGNOSIS — M25511 Pain in right shoulder: Secondary | ICD-10-CM | POA: Diagnosis not present

## 2020-10-20 DIAGNOSIS — M25511 Pain in right shoulder: Secondary | ICD-10-CM | POA: Diagnosis not present

## 2020-10-25 DIAGNOSIS — M25511 Pain in right shoulder: Secondary | ICD-10-CM | POA: Diagnosis not present

## 2020-10-31 DIAGNOSIS — M25511 Pain in right shoulder: Secondary | ICD-10-CM | POA: Diagnosis not present

## 2020-11-06 DIAGNOSIS — Z96611 Presence of right artificial shoulder joint: Secondary | ICD-10-CM | POA: Diagnosis not present

## 2020-11-07 ENCOUNTER — Ambulatory Visit: Payer: Medicare PPO | Admitting: Podiatry

## 2020-11-07 ENCOUNTER — Other Ambulatory Visit: Payer: Self-pay

## 2020-11-07 DIAGNOSIS — L84 Corns and callosities: Secondary | ICD-10-CM | POA: Diagnosis not present

## 2020-11-07 DIAGNOSIS — M216X9 Other acquired deformities of unspecified foot: Secondary | ICD-10-CM | POA: Diagnosis not present

## 2020-11-11 NOTE — Progress Notes (Signed)
Subjective: 75 year old female presents the office today for painful calluses to both balls of her her feet which do cause discomfort.  Denies any open sores or drainage. Denies any systemic complaints such as fevers, chills, nausea, vomiting. No acute changes since last appointment, and no other complaints at this time.   Objective: AAO x3, NAD DP/PT pulses palpable bilaterally, CRT less than 3 seconds Previous hammertoe repair of the second digits.   Diffuse hyperkeratotic lesion submetatarsal area right side worse than left.  There is no underlying ulceration drainage or any signs of infection.  The right side is more diffuse under the metatarsal heads and more localized on the left side. No open lesions.  No pain with calf compression, swelling, warmth, erythema  Assessment: Problem metatarsal heads resulted in hyperkeratotic lesions; onychomycosis  Plan: -All treatment options discussed with the patient including all alternatives, risks, complications.  -Debrided hyperkeratotic lesion without any complications or bleeding.  I continue with urea cream, offloading.  Unfortunately due to atrophy of fat pad this is very difficult.    Trula Slade DPM

## 2020-12-04 ENCOUNTER — Other Ambulatory Visit: Payer: Self-pay | Admitting: Orthopedic Surgery

## 2020-12-04 ENCOUNTER — Other Ambulatory Visit (HOSPITAL_COMMUNITY): Payer: Self-pay | Admitting: Orthopedic Surgery

## 2020-12-04 DIAGNOSIS — Z96611 Presence of right artificial shoulder joint: Secondary | ICD-10-CM | POA: Diagnosis not present

## 2020-12-04 DIAGNOSIS — Z471 Aftercare following joint replacement surgery: Secondary | ICD-10-CM | POA: Diagnosis not present

## 2020-12-06 DIAGNOSIS — Z96611 Presence of right artificial shoulder joint: Secondary | ICD-10-CM | POA: Diagnosis not present

## 2020-12-13 ENCOUNTER — Encounter (HOSPITAL_COMMUNITY)
Admission: RE | Admit: 2020-12-13 | Discharge: 2020-12-13 | Disposition: A | Discharge status: Home / Self Care | Payer: Medicare PPO | Source: Ambulatory Visit | Attending: Orthopedic Surgery | Admitting: Orthopedic Surgery

## 2020-12-13 ENCOUNTER — Other Ambulatory Visit: Payer: Self-pay

## 2020-12-13 DIAGNOSIS — Z96611 Presence of right artificial shoulder joint: Secondary | ICD-10-CM | POA: Insufficient documentation

## 2020-12-13 DIAGNOSIS — M25511 Pain in right shoulder: Secondary | ICD-10-CM | POA: Diagnosis not present

## 2020-12-13 MED ORDER — TECHNETIUM TC 99M MEDRONATE IV KIT
20.0000 | PACK | Freq: Once | INTRAVENOUS | Status: AC | PRN
  Administered 2020-12-13: 20 via INTRAVENOUS

## 2021-01-15 ENCOUNTER — Encounter (HOSPITAL_COMMUNITY): Payer: Self-pay | Admitting: Radiology

## 2021-01-16 ENCOUNTER — Ambulatory Visit: Payer: Medicare PPO | Admitting: Podiatry

## 2021-01-16 ENCOUNTER — Other Ambulatory Visit: Payer: Self-pay

## 2021-01-16 DIAGNOSIS — L84 Corns and callosities: Secondary | ICD-10-CM

## 2021-01-16 DIAGNOSIS — I872 Venous insufficiency (chronic) (peripheral): Secondary | ICD-10-CM | POA: Diagnosis not present

## 2021-01-16 DIAGNOSIS — M216X9 Other acquired deformities of unspecified foot: Secondary | ICD-10-CM | POA: Diagnosis not present

## 2021-01-19 NOTE — Progress Notes (Signed)
Subjective: 75 year old female presents the office today for painful calluses to both balls of her her feet which do cause discomfort.  She has some burning but seems to be localized under the ball of her foot on the callused areas.  No recent injury.  She is interested in possible steroid injection to see if that would be helpful.  Asking for referral to vein specialist  Objective: AAO x3, NAD DP/PT pulses palpable bilaterally, CRT less than 3 seconds Spider veins, mild varicose veins are present. Sensation intact with Semmes Weinstein monofilament. Previous hammertoe repair of the second digits.   Diffuse hyperkeratotic lesion submetatarsal area right side worse than left.  There is no underlying ulceration drainage or any signs of infection.  The right side is more diffuse under the metatarsal heads and more localized on the left side. Prominence of metatarsal heads plantarly with atrophy of the fat pad. No open lesions.  No pain with calf compression, swelling, warmth, erythema  Assessment: Problem metatarsal heads resulted in hyperkeratotic lesions; onychomycosis  Plan: -All treatment options discussed with the patient including all alternatives, risks, complications.  -Debrided hyperkeratotic lesion without any complications or bleeding.  I continue with urea cream, offloading.  Unfortunately due to atrophy of fat pad this is very difficult.  I discussed doing a steroid injection and I don't mind trying one but there is not 1 specific area that is more tender as diffuse.  I am not sure how helpful a steroid injection opiates that she does have a follow-up on this.  Discussed doing a topical  medication to see if this will help with the nerve pain if needed. -She does have some burning to his feet localized to the ball of the foot where the calluses are out.  No radiating pain. -Referral to vein specialist given the varicose veins at her request.  Trula Slade DPM

## 2021-01-31 DIAGNOSIS — M25511 Pain in right shoulder: Secondary | ICD-10-CM | POA: Diagnosis not present

## 2021-01-31 DIAGNOSIS — Z96611 Presence of right artificial shoulder joint: Secondary | ICD-10-CM | POA: Diagnosis not present

## 2021-02-19 ENCOUNTER — Other Ambulatory Visit: Payer: Self-pay | Admitting: *Deleted

## 2021-02-19 DIAGNOSIS — I872 Venous insufficiency (chronic) (peripheral): Secondary | ICD-10-CM

## 2021-02-22 DIAGNOSIS — C44712 Basal cell carcinoma of skin of right lower limb, including hip: Secondary | ICD-10-CM | POA: Diagnosis not present

## 2021-02-22 DIAGNOSIS — D485 Neoplasm of uncertain behavior of skin: Secondary | ICD-10-CM | POA: Diagnosis not present

## 2021-02-22 DIAGNOSIS — Z85828 Personal history of other malignant neoplasm of skin: Secondary | ICD-10-CM | POA: Diagnosis not present

## 2021-02-22 DIAGNOSIS — L818 Other specified disorders of pigmentation: Secondary | ICD-10-CM | POA: Diagnosis not present

## 2021-02-22 DIAGNOSIS — D2271 Melanocytic nevi of right lower limb, including hip: Secondary | ICD-10-CM | POA: Diagnosis not present

## 2021-02-22 DIAGNOSIS — L820 Inflamed seborrheic keratosis: Secondary | ICD-10-CM | POA: Diagnosis not present

## 2021-02-22 DIAGNOSIS — D2272 Melanocytic nevi of left lower limb, including hip: Secondary | ICD-10-CM | POA: Diagnosis not present

## 2021-02-22 DIAGNOSIS — L821 Other seborrheic keratosis: Secondary | ICD-10-CM | POA: Diagnosis not present

## 2021-02-22 NOTE — Progress Notes (Signed)
Office Note     CC: telangiectasias in bilateral feet, bilateral varicosities in the legs Requesting Provider:  Prince Solian, MD  HPI: Natalie [name] is a 75 y.o. ([birth year]) female who presents at the request of Avva, Ravisankar, MD for evaluation of chronic venous insufficiency.  Natalie [name] has a history of left-sided greater saphenous vein ablation in 2005.  She was scheduled for right-sided greater saphenous vein ablation pre-COVID, however this was sidelined as vein procedures were halted during the pandemic. Since seen last in the office, Natalie [name] has been doing well.  She has had multiple operations for hammertoes on bilateral feet. She has bilateral foot pain at baseline due to a thinning fat pad on the plantar aspect of her foot.   On exam, Natalie [name] was doing well.  She noted bilateral calf pain to palpation at sites of varicosities.  She denied significant heaviness, tired feeling, edema by days end.  She denies symptoms of claudication, rest pain, tissue loss. She denies bleeding or ulceration of varicosities.  She is used compression in the past which have helped.  No history of DVT.   The pt  on a statin for cholesterol management.  The pt  on a daily aspirin.   Other AC:  none The pt is on medication for hypertension.   The pt is not diabetic.   Tobacco hx:  none  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    Past Surgical History:  Procedure Laterality Date   ABDOMINAL HYSTERECTOMY  1982   TAH BSO   arm surgery  1998 right arm   CESAREAN SECTION     ECTOPIC PREGNANCY  SURGERY     ENDOVENOUS ABLATION SAPHENOUS VEIN W/ LASER  left leg 10 years ago per pt.   EXPLORATORY TYMPANOTOMY     FINGER SURGERY  2007  right hand 4th and 5th digits   REVERSE SHOULDER ARTHROPLASTY Right 09/07/2020   Procedure: REVERSE SHOULDER ARTHROPLASTY;  Surgeon: Justice Britain, MD;  Location: [facility];  Service: Orthopedics;  Laterality: Right;  167min   SHOULDER SURGERY  2019   STAPEDECTOMY  2001   STAPEDECTOMY  revision 2002   TONSILLECTOMY  1954   VEIN LIGATION AND STRIPPING  9-12   LEFT- RIGHT LEG INJECTED FOR SPIDER VEINS    Social History   Socioeconomic History   Marital status: Married    Spouse name: Not on file   Number of children: Not on file   Years of education: Not on file   Highest education level: Not on file  Occupational History   Not on file  Tobacco Use   Smoking status: Never   Smokeless tobacco: Never  Vaping Use   Vaping Use: Never used  Substance and Sexual Activity   Alcohol use: No   Drug use: No   Sexual activity: Yes    Birth control/protection: None  Other Topics Concern   Not on file  Social  History Narrative   Not on file   Social Determinants of Health   Financial Resource Strain: Not on file  Food Insecurity: Not on file  Transportation Needs: Not on file  Physical Activity: Not on file  Stress: Not on file  Social Connections: Not on file  Intimate Partner Violence: Not on file    Family History  Problem Relation Age of Onset   Heart disease Mother    Hypertension Mother    Heart disease Father    Hypertension Father     Current Outpatient Medications  Medication Sig Dispense Refill   acetaminophen (TYLENOL) 325 MG tablet Take 650 mg by mouth every 6 (six) hours as needed for moderate pain.     aspirin 81 MG tablet Take 81 mg by mouth daily.     atenolol-chlorthalidone (TENORETIC) 100-25 MG per tablet Take 0.5 tablets by mouth daily.     atorvastatin (LIPITOR) 20 MG tablet Take 20 mg by mouth at bedtime.      Cholecalciferol (VITAMIN D) 125 MCG (5000 UT) CAPS Take 5,000 Units by mouth daily.     estradiol (ESTRACE) 0.5 MG tablet Take 0.25 mg by mouth every other day.     fluticasone (FLONASE) 50 MCG/ACT nasal spray Place 1 spray into both nostrils at bedtime as needed for allergies or rhinitis.     losartan (COZAAR) 50 MG tablet Take 50 mg by mouth at bedtime.     meloxicam (MOBIC) 15 MG tablet Take 1 tablet (15 mg total) by mouth at bedtime. 30 tablet 1   mometasone (ELOCON) 0.1 % cream Apply 1 application topically daily as needed (itching).     omeprazole (PRILOSEC) 20 MG capsule TAKE 1 CAPSULE (20 MG TOTAL) TWO TIMES DAILY. (Patient taking differently: Take 20 mg by mouth in the morning and at bedtime.) 180 capsule 0   ondansetron (ZOFRAN) 4 MG tablet Take 1 tablet (4 mg total) by mouth every 8 (eight) hours as needed for nausea or vomiting. 10 tablet 0   oxyCODONE-acetaminophen (PERCOCET) 5-325 MG tablet Take 1 tablet by mouth every 4 (four) hours as needed (max 6 q). 20 tablet 0   pramipexole (MIRAPEX) 0.5 MG tablet Take 0.5 mg by mouth at bedtime.     tiZANidine (ZANAFLEX) 4 MG tablet Take 0.5 tablets (2 mg total) by mouth every 6 (six) hours as needed for muscle spasms. 30 tablet 1   No current facility-administered medications for this visit.    Allergies  Allergen Reactions   Cephalexin     Can not recall reaction   Nitrofurantoin Monohyd Macro     Call not recall reaction   Sulfa Antibiotics Nausea Only     REVIEW OF SYSTEMS:   [X]  denotes positive finding, [ ]  denotes negative finding Cardiac  Comments:  Chest pain or chest pressure:    Shortness of breath upon exertion:    Short of breath when lying flat:    Irregular heart rhythm:        Vascular    Pain in calf, thigh, or hip brought on by ambulation:    Pain in feet at night that wakes you up from your sleep:     Blood clot in your veins:    Leg swelling:         Pulmonary    Oxygen at home:    Productive cough:      Wheezing:         Neurologic    Sudden weakness in arms or  legs:     Sudden numbness in arms or legs:     Sudden onset of difficulty speaking or slurred speech:    Temporary loss of vision in one eye:     Problems with dizziness:         Gastrointestinal    Blood in stool:     Vomited blood:         Genitourinary    Burning when urinating:     Blood in urine:        Psychiatric    Major depression:         Hematologic    Bleeding problems:    Problems with blood clotting too easily:        Skin    Rashes or ulcers:        Constitutional    Fever or chills:      PHYSICAL EXAMINATION:  There were no vitals filed for this visit.  General:  WDWN in NAD; vital signs documented above Gait: Not observed HENT: WNL, normocephalic Pulmonary: normal non-labored breathing , without Rales, rhonchi,  wheezing Cardiac: regular HR,  Abdomen: soft, NT, no masses Skin: without rashes Vascular Exam/Pulses:  Right Left  Radial 2+ (normal) 2+ (normal)  Ulnar 2+ (normal) 2+ (normal)  Femoral    Popliteal    DP 2+ (normal) 2+ (normal)  PT 2+ (normal) 2+ (normal)   Extremities: without ischemic changes, without Gangrene , without cellulitis; without open wounds;  Musculoskeletal: no muscle wasting or atrophy  Neurologic: A&O X 3;  No focal weakness or paresthesias are detected Psychiatric:  The pt has Normal affect.   Non-Invasive Vascular Imaging:   Venous Reflux Times  +--------------+---------+------+-----------+------------+--------+  RIGHT         Reflux NoRefluxReflux TimeDiameter cmsComments                          Yes                                   +--------------+---------+------+-----------+------------+--------+  CFV           no                                              +--------------+---------+------+-----------+------------+--------+  FV mid        no                                               +--------------+---------+------+-----------+------------+--------+  Popliteal     no                                              +--------------+---------+------+-----------+------------+--------+  GSV at SFJ              yes    >500 ms      0.46              +--------------+---------+------+-----------+------------+--------+  GSV prox thighno  0.34              +--------------+---------+------+-----------+------------+--------+  GSV mid thigh           yes    >500 ms      0.29              +--------------+---------+------+-----------+------------+--------+  GSV dist thigh          yes    >500 ms      0.30              +--------------+---------+------+-----------+------------+--------+  GSV at knee             yes    >500 ms      0.21              +--------------+---------+------+-----------+------------+--------+  SSV Pop Fossa no                            0.37              +--------------+---------+------+-----------+------------+--------+     ASSESSMENT/PLAN:: 75 y.o. female presenting with known chronic venous insufficiency.  Bilateral venous duplex ultrasonography was reviewed demonstrating reflux throughout the right greater saphenous vein. Natalie [name] is more concerned about the varicosities she can see on her leg as well as the telangiectasias in her feet.  While she does have some right lower extremity heaviness and swelling, she is also concerned about the appearance.  We discussed sclerotherapy as well as greater saphenous vein laser ablation with stab phlebectomy.  I told Natalie [name] that while these treatments, this will not ensure her legs will be varicose-free.  Furthermore I do not recommend sclerotherapy of the foot.  After discussing the risks and benefits of the above, Natalie [name] elected to continue compression sock therapy. I offered an appointment at 3 months to check-in, however she stated she would call  should she choose to pursue greater saphenous vein ablation, stab phlebectomy.   Broadus John, MD [facility] [phone]

## 2021-02-23 ENCOUNTER — Other Ambulatory Visit: Payer: Self-pay

## 2021-02-23 ENCOUNTER — Encounter: Payer: Self-pay | Admitting: Vascular Surgery

## 2021-02-23 ENCOUNTER — Ambulatory Visit (HOSPITAL_COMMUNITY)
Admission: RE | Admit: 2021-02-23 | Discharge: 2021-02-23 | Disposition: A | Discharge status: Home / Self Care | Payer: Medicare PPO | Source: Ambulatory Visit | Attending: Vascular Surgery | Admitting: Vascular Surgery

## 2021-02-23 ENCOUNTER — Ambulatory Visit: Payer: Medicare PPO | Admitting: Vascular Surgery

## 2021-02-23 VITALS — BP 153/81 | HR 73 | Temp 97.9°F | Resp 20 | Ht 62.0 in | Wt 157.0 lb

## 2021-02-23 DIAGNOSIS — I872 Venous insufficiency (chronic) (peripheral): Secondary | ICD-10-CM | POA: Diagnosis not present

## 2021-02-27 ENCOUNTER — Ambulatory Visit: Payer: Self-pay | Admitting: Nurse Practitioner

## 2021-03-05 DIAGNOSIS — M25511 Pain in right shoulder: Secondary | ICD-10-CM | POA: Diagnosis not present

## 2021-03-05 DIAGNOSIS — Z96611 Presence of right artificial shoulder joint: Secondary | ICD-10-CM | POA: Diagnosis not present

## 2021-03-27 ENCOUNTER — Encounter: Payer: Self-pay | Admitting: Nurse Practitioner

## 2021-03-27 ENCOUNTER — Ambulatory Visit (INDEPENDENT_AMBULATORY_CARE_PROVIDER_SITE_OTHER): Payer: Medicare PPO | Admitting: Nurse Practitioner

## 2021-03-27 ENCOUNTER — Other Ambulatory Visit: Payer: Self-pay

## 2021-03-27 VITALS — BP 124/82 | Ht 60.5 in | Wt 152.0 lb

## 2021-03-27 DIAGNOSIS — Z01419 Encounter for gynecological examination (general) (routine) without abnormal findings: Secondary | ICD-10-CM | POA: Diagnosis not present

## 2021-03-27 DIAGNOSIS — N763 Subacute and chronic vulvitis: Secondary | ICD-10-CM | POA: Diagnosis not present

## 2021-03-27 DIAGNOSIS — M8589 Other specified disorders of bone density and structure, multiple sites: Secondary | ICD-10-CM

## 2021-03-27 DIAGNOSIS — L900 Lichen sclerosus et atrophicus: Secondary | ICD-10-CM

## 2021-03-27 DIAGNOSIS — Z780 Asymptomatic menopausal state: Secondary | ICD-10-CM

## 2021-03-27 MED ORDER — BETAMETHASONE VALERATE 0.1 % EX OINT: 1.0000 "application " | TOPICAL_OINTMENT | Freq: Two times a day (BID) | CUTANEOUS | 1 refills | Status: DC

## 2021-03-27 NOTE — Progress Notes (Signed)
Lafaye Mcelmurry May 17, 1945 [id number]   History:  75 y.o. [id number] presents for breast and pelvic exam. She complains of intermittent vulvar itching and mild odor without discharge. This is not new and has been ongoing for months. Itching is mostly perineal. Postmenopausal - stopped HRT last year and doing fine. S/P 1982 TAH BSO for DUB. Normal pap and mammogram history. HTN, HLD managed by PCP. History of skin cancer, sees dermatology.   Gynecologic History No LMP recorded. Patient has had a hysterectomy.    Health maintenance Last Pap: 2010. Results were: Normal Last mammogram: 02/02/2020. Results were: Normal Last colonoscopy: 05/06/2018. Results were: Benign polyps Last Dexa: 04/18/2020. Results were: T-score -2.0, FRAX 14% / 3.6%  Past medical history, past surgical history, family history and social history were all reviewed and documented in the EPIC chart. Married. 2 children.   ROS:  A ROS was performed and pertinent positives and negatives are included.  Exam:  Vitals:   03/27/21 1056  BP: 124/82  Weight: 152 lb (68.9 kg)  Height: 5' 0.5" (1.537 m)    Body mass index is 29.2 kg/m.  General appearance:  Normal Thyroid:  Symmetrical, normal in size, without palpable masses or nodularity. Respiratory  Auscultation:  Clear without wheezing or rhonchi Cardiovascular  Auscultation:  Regular rate, without rubs, murmurs or gallops  Edema/varicosities:  Not grossly evident Abdominal  Soft,nontender, without masses, guarding or rebound.  Liver/spleen:  No organomegaly noted  Hernia:  None appreciated  Skin  Inspection:  Grossly normal. Many seborrheic keratoses    Breasts: Examined lying and sitting.   Right: Without masses, retractions, discharge or axillary adenopathy. Small dry patch on right superior nipple, erythematous base  Left: Without masses, retractions, discharge or axillary adenopathy. Genitourinary   Inguinal/mons:  Normal without inguinal  adenopathy  External genitalia:  Normal appearing vulva with no masses, tenderness, or lesions. Atrophic changes  BUS/Urethra/Skene's glands:  Normal  Vagina:  Normal appearing with normal color and discharge, no lesions. Atrophic changes  Cervix:  Absent  Uterus:  Absent  Adnexa/parametria:     Rt: Normal in size, without masses or tenderness.   Lt: Normal in size, without masses or tenderness.  Anus and perineum: Redness with areas of pearly appearance consistent with lichen sclerosus. Non-bleeding hemorrhoids  Digital rectal exam: Normal sphincter tone without palpated masses or tenderness  Patient informed chaperone available to be present for breast and pelvic exam. Patient has requested no chaperone to be present. Patient has been advised what will be completed during breast and pelvic exam.   Assessment/Plan:  75 y.o. [id number] for breast and pelvic exam.   Well female exam with routine gynecological exam - Education provided on SBEs, importance of preventative screenings, current guidelines, high calcium diet, regular exercise, and multivitamin daily.  Labs with PCP.   Postmenopausal - stopped HRT one year ago and is doing fine. S/P 1982 TAH BSO for DUB.  Osteopenia of multiple sites - T-score -2.0 without elevated FRAX. Continue Vitamin D supplement. Increase calcium in diet or take supplement, increase exercise. Will repeat DXA next year.   Chronic vulvitis - Plan: betamethasone valerate ointment (VALISONE) 0.1 %. Symptoms likely from atrophic vaginitis OR lichen sclerosus. Perineum red with areas of pearly appearance so more likely LS. She will apply Betamethasone nightly x 1 week, then every other night x 1 week, then twice weekly if needed. If no improvement we discussed the option of vaginal estrogen. Also recommend moisturizing with oil-based lubricant such as coconut  oil.  Screening for cervical cancer - Normal Pap history.  No longer screening per guidelines.  Screening for  breast cancer - Normal mammogram history.  She is overdue d/t left shoulder surgery complications. She plans to schedule this soon. If unable to perform mammogram on that side it is recommended she have ultrasound.  Normal breast exam today.  Screening for colon cancer - 2020 colonoscopy. Screenings no longer recommended per GI.   Follow-up in 2 years for breast and pelvic.      Tamela Gammon [facility], 12:16 PM 03/27/2021

## 2021-05-02 DIAGNOSIS — H5213 Myopia, bilateral: Secondary | ICD-10-CM | POA: Diagnosis not present

## 2021-05-02 DIAGNOSIS — H04123 Dry eye syndrome of bilateral lacrimal glands: Secondary | ICD-10-CM | POA: Diagnosis not present

## 2021-05-02 DIAGNOSIS — Z961 Presence of intraocular lens: Secondary | ICD-10-CM | POA: Diagnosis not present

## 2021-05-11 DIAGNOSIS — M25511 Pain in right shoulder: Secondary | ICD-10-CM | POA: Diagnosis not present

## 2021-05-11 DIAGNOSIS — R202 Paresthesia of skin: Secondary | ICD-10-CM | POA: Diagnosis not present

## 2021-05-23 ENCOUNTER — Other Ambulatory Visit (HOSPITAL_COMMUNITY): Payer: Self-pay | Admitting: Orthopedic Surgery

## 2021-05-23 DIAGNOSIS — Z96612 Presence of left artificial shoulder joint: Secondary | ICD-10-CM

## 2021-05-23 DIAGNOSIS — M25511 Pain in right shoulder: Secondary | ICD-10-CM | POA: Diagnosis not present

## 2021-05-23 DIAGNOSIS — Z96611 Presence of right artificial shoulder joint: Secondary | ICD-10-CM | POA: Diagnosis not present

## 2021-05-30 ENCOUNTER — Other Ambulatory Visit: Payer: Self-pay

## 2021-05-30 ENCOUNTER — Encounter (HOSPITAL_COMMUNITY)
Admission: RE | Admit: 2021-05-30 | Discharge: 2021-05-30 | Disposition: A | Discharge status: Home / Self Care | Payer: Medicare PPO | Source: Ambulatory Visit | Attending: Orthopedic Surgery | Admitting: Orthopedic Surgery

## 2021-05-30 DIAGNOSIS — Z96612 Presence of left artificial shoulder joint: Secondary | ICD-10-CM | POA: Diagnosis not present

## 2021-05-30 DIAGNOSIS — Z96611 Presence of right artificial shoulder joint: Secondary | ICD-10-CM | POA: Diagnosis not present

## 2021-05-30 DIAGNOSIS — M25511 Pain in right shoulder: Secondary | ICD-10-CM | POA: Diagnosis not present

## 2021-05-30 MED ORDER — TECHNETIUM TC 99M MEDRONATE IV KIT
20.0000 | PACK | Freq: Once | INTRAVENOUS | Status: AC | PRN
  Administered 2021-05-30: 20 via INTRAVENOUS

## 2021-06-05 DIAGNOSIS — M25511 Pain in right shoulder: Secondary | ICD-10-CM | POA: Diagnosis not present

## 2021-06-13 DIAGNOSIS — M25511 Pain in right shoulder: Secondary | ICD-10-CM | POA: Diagnosis not present

## 2021-06-13 DIAGNOSIS — Z96611 Presence of right artificial shoulder joint: Secondary | ICD-10-CM | POA: Diagnosis not present

## 2021-06-18 NOTE — Progress Notes (Signed)
Surgery orders requested via Epic °

## 2021-06-18 NOTE — Progress Notes (Addendum)
COVID swab appointment: 07-10-21 @ 9:30 AM ? ?COVID Vaccine Completed: Yes x2 ?Date COVID Vaccine completed: 06-03-19 06-29-19 ?Has received booster:  Yes x2 02-14-20  02-20-21 ?COVID vaccine manufacturer:     Moderna    ? ?Date of COVID positive in last 90 days:  No ? ?PCP - Prince Solian, MD ?Cardiologist - N/A ? ?Chest x-ray - N/A ?EKG - 08-28-20 Epic ?Stress Test - N/A ?ECHO - N/A ?Cardiac Cath - N/A ?Pacemaker/ICD device last checked: ?Spinal Cord Stimulator: ? ?Bowel Prep - N/A ? ?Sleep Study - N/A ?CPAP -  ? ?Fasting Blood Sugar - N/A ?Checks Blood Sugar _____ times a day ? ?Blood Thinner Instructions: ?Aspirin Instructions:  ASA 81 mg.  To stop one week before per patient ?Last Dose: ? ?Activity level:   Can go up a flight of stairs and perform activities of daily living without stopping and without symptoms of chest pain or shortness of breath. ?     ? ?Anesthesia review: N/A ? ?Patient denies shortness of breath, fever, cough and chest pain at PAT appointment ? ? ?Patient verbalized understanding of instructions that were given to them at the PAT appointment. Patient was also instructed that they will need to review over the PAT instructions again at home before surgery.  ?

## 2021-06-18 NOTE — Patient Instructions (Addendum)
DUE TO COVID-19 ONLY ONE VISITOR IS ALLOWED TO COME WITH YOU AND STAY IN THE WAITING ROOM ONLY DURING PRE OP AND PROCEDURE.   ?**NO VISITORS ARE ALLOWED IN THE SHORT STAY AREA OR RECOVERY ROOM!!** ? ? ?IF YOU WILL BE ADMITTED INTO THE HOSPITAL YOU ARE ALLOWED ONLY TWO SUPPORT PEOPLE DURING VISITATION HOURS ONLY (7 AM -8PM)   ? ?Up to two visitors ages 16+ are allowed at one time in a patient's room.  The visitors may rotate out with other people throughout the day.  Additionally, up to two children between the ages of 37 and 56 are allowed and do not count toward the number of allowed visitors.  Children within this age range must be accompanied by an adult visitor.  One adult visitor may remain with the patient overnight and must be in the room by 8 PM. ? ?The support person(s) must pass our screening, gel in and out, and wear a mask at all times, including in the patient?s room. ?Patients must also wear a mask when staff or their support person are in the room. ?Visitors GUEST BADGE MUST BE WORN VISIBLY  ?One adult visitor may remain with you overnight and MUST be in the room by 8 P.M. ? ?No visitors under the age of 44. Any visitor under the age of 60 must be accompanied by an adult.  ? ? COVID SWAB TESTING MUST BE COMPLETED ON:  07-10-21 @ 9:30 AM ? ?  Site: [facility] [address] [place]  ?Enter: Main Entrance have a seat in the waiting area to the right of main entrance (DO NOT CHECK IN AT ADMITTING!!!!!) ?Dial: [phone] to alert staff you have arrived ? ?You are not required to quarantine, however you are required to wear a well-fitted mask when you are out and around people not in your household.  ?Hand Hygiene often ?Do NOT share personal items ?Notify your provider if you are in close contact with someone who has COVID or you develop fever 100.4 or greater, new onset of sneezing, cough, sore throat, shortness of breath or body aches. ?     ? Your procedure is scheduled on:  Thursday, 07-12-21 ? ? Report to [facility] Main Entrance ? ?  Report to admitting at 1:15 PM ? ? Call this number if you have problems the morning of surgery [phone] ? ? Do not eat food :After Midnight. ? ? After Midnight you may have the following liquids until 12:30 PM DAY OF SURGERY ? ?Water ?Black Coffee (sugar ok, NO MILK/CREAM OR CREAMERS)  ?Tea (sugar ok, NO MILK/CREAM OR CREAMERS) regular and decaf                             ?Plain Jell-O (NO RED)                                           ?Fruit ices (not with fruit pulp, NO RED)                                     ?Popsicles (NO RED)                                                                  ?  Juice: apple, WHITE grape, WHITE cranberry ?Sports drinks like Gatorade (NO RED) ?Clear broth(vegetable,chicken,beef) ? ?             ?Drink 1 Ensure drink AT 12:30 PM the day of surgery.  ? ?  ?  ?The day of surgery:  ?Drink ONE (1) Pre-Surgery Clear Ensure the day of surgery at 12:30 PM. Drink in one sitting. Do not sip.  ?This drink was given to you during your hospital  ?pre-op appointment visit. ?Nothing else to drink after completing the Pre-Surgery Clear Ensure. ?  ?       If you have questions, please contact your surgeon?s office. ? ?  ?Oral Hygiene is also important to reduce your risk of infection.                                    ?Remember - BRUSH YOUR TEETH THE MORNING OF SURGERY WITH YOUR REGULAR TOOTHPASTE ? ? Do NOT smoke after Midnight ? ? Take these medicines the morning of surgery with A SIP OF WATER:  Tylenol, Omeprazole ? ? Aspirin 81 mg - stop one week before surgery  ? ?` Stop all vitamins and herbal supplements a week before surgery.   ? ? Stop Motrin, Aleve, Ibuprofen a week before surgery. ?                   ?           You may not have any metal on your body including hair pins, jewelry, and body piercing ? ?           Do not wear make-up, lotions, powders, perfumes or deodorant ? ?Do not wear nail polish including  gel and S&S, artificial/acrylic nails, or any other type of covering on natural nails including finger and toenails. If you have artificial nails, gel coating, etc. that needs to be removed by a nail salon please have this removed prior to surgery or surgery may need to be canceled/ delayed if the surgeon/ anesthesia feels like they are unable to be safely monitored.  ? ?Do not shave  48 hours prior to surgery.  ? ? Contacts, dentures or bridgework may not be worn into surgery. ? ? Bring small overnight bag day of surgery. ? ? Do not bring valuables to the hospital. [place]. ? ?Please read over the following fact sheets you were given: IF Kennan Rupert  ? ? [place] - Preparing for Surgery ?Before surgery, you can play an important role.  Because skin is not sterile, your skin needs to be as free of germs as possible.  You can reduce the number of germs on your skin by washing with CHG (chlorahexidine gluconate) soap before surgery.  CHG is an antiseptic cleaner which kills germs and bonds with the skin to continue killing germs even after washing. ?Please DO NOT use if you have an allergy to CHG or antibacterial soaps.  If your skin becomes reddened/irritated stop using the CHG and inform your nurse when you arrive at Short Stay. ?Do not shave (including legs and underarms) for at least 48 hours prior to the first CHG shower.  You may shave your face/neck. ? ?Please follow these instructions carefully: ? 1.  Shower with CHG Soap the night before surgery and the  morning  of surgery. ? 2.  If you choose to wash your hair, wash your hair first as usual with your normal  shampoo. ? 3.  After you shampoo, rinse your hair and body thoroughly to remove the shampoo.                            ? 4.  Use CHG as you would any other liquid soap.  You can apply chg directly to the skin and wash.  Gently with a scrungie or  clean washcloth. ? 5.  Apply the CHG Soap to your body ONLY FROM THE NECK DOWN.   Do   not use on face/ open      ?                     Wound or open sores. Avoid contact with eyes, ears mouth and   genitals (private parts).  ?                     Production manager,  Genitals (private parts) with your normal soap. ?            6.  Wash thoroughly, paying special attention to the area where your    surgery  will be performed. ? 7.  Thoroughly rinse your body with warm water from the neck down. ? 8.  DO NOT shower/wash with your normal soap after using and rinsing off the CHG Soap. ?               9.  Pat yourself dry with a clean towel. ?           10.  Wear clean pajamas. ?           11.  Place clean sheets on your bed the night of your first shower and do not  sleep with pets. ?Day of Surgery : ?Do not apply any lotions/deodorants the morning of surgery.  Please wear clean clothes to the hospital/surgery center. ? ?FAILURE TO FOLLOW THESE INSTRUCTIONS MAY RESULT IN THE CANCELLATION OF YOUR SURGERY ? ?PATIENT SIGNATURE_________________________________ ? ?NURSE SIGNATURE__________________________________ ? ?________________________________________________________________________  ?

## 2021-06-20 ENCOUNTER — Encounter (HOSPITAL_COMMUNITY): Payer: Self-pay

## 2021-06-20 ENCOUNTER — Other Ambulatory Visit: Payer: Self-pay

## 2021-06-20 ENCOUNTER — Encounter (HOSPITAL_COMMUNITY)
Admission: RE | Admit: 2021-06-20 | Discharge: 2021-06-20 | Disposition: A | Discharge status: Home / Self Care | Payer: Medicare PPO | Source: Ambulatory Visit | Attending: Orthopedic Surgery | Admitting: Orthopedic Surgery

## 2021-06-20 VITALS — BP 165/75 | HR 65 | Temp 98.5°F | Resp 20 | Ht 62.0 in | Wt 148.8 lb

## 2021-06-20 DIAGNOSIS — Z01812 Encounter for preprocedural laboratory examination: Secondary | ICD-10-CM | POA: Diagnosis not present

## 2021-06-20 DIAGNOSIS — I2510 Atherosclerotic heart disease of native coronary artery without angina pectoris: Secondary | ICD-10-CM | POA: Diagnosis not present

## 2021-06-20 DIAGNOSIS — Z01818 Encounter for other preprocedural examination: Secondary | ICD-10-CM

## 2021-06-20 LAB — CBC
HCT: 37.2 % (ref 36.0–46.0)
Hemoglobin: 12.2 g/dL (ref 12.0–15.0)
MCH: 30.3 pg (ref 26.0–34.0)
MCHC: 32.8 g/dL (ref 30.0–36.0)
MCV: 92.5 fL (ref 80.0–100.0)
Platelets: 252 10*3/uL (ref 150–400)
RBC: 4.02 MIL/uL (ref 3.87–5.11)
RDW: 13.5 % (ref 11.5–15.5)
WBC: 7.3 10*3/uL (ref 4.0–10.5)
nRBC: 0 % (ref 0.0–0.2)

## 2021-06-20 LAB — BASIC METABOLIC PANEL
Anion gap: 9 (ref 5–15)
BUN: 21 mg/dL (ref 8–23)
CO2: 26 mmol/L (ref 22–32)
Calcium: 9.6 mg/dL (ref 8.9–10.3)
Chloride: 99 mmol/L (ref 98–111)
Creatinine, Ser: 0.97 mg/dL (ref 0.44–1.00)
GFR, Estimated: >60 mL/min (ref >60)
Glucose, Bld: 82 mg/dL (ref 70–99)
Potassium: 3.9 mmol/L (ref 3.5–5.1)
Sodium: 134 mmol/L — ABNORMAL LOW (ref 135–145)

## 2021-06-22 ENCOUNTER — Telehealth: Payer: Self-pay

## 2021-06-22 NOTE — Telephone Encounter (Signed)
Pt called to schedule sclerotherapy. She is having 2 upcoming shoulder surgeries and has opted to wait until after the surgeries to move forward with sclerotherapy. ?

## 2021-06-26 ENCOUNTER — Ambulatory Visit: Payer: Medicare PPO | Admitting: Podiatry

## 2021-06-26 ENCOUNTER — Other Ambulatory Visit: Payer: Self-pay

## 2021-06-26 DIAGNOSIS — L84 Corns and callosities: Secondary | ICD-10-CM

## 2021-06-26 DIAGNOSIS — M216X9 Other acquired deformities of unspecified foot: Secondary | ICD-10-CM | POA: Diagnosis not present

## 2021-06-26 DIAGNOSIS — L600 Ingrowing nail: Secondary | ICD-10-CM

## 2021-06-26 MED ORDER — MUPIROCIN 2 % EX OINT: 1.0000 "application " | TOPICAL_OINTMENT | Freq: Two times a day (BID) | CUTANEOUS | 2 refills | Status: DC

## 2021-06-26 NOTE — Patient Instructions (Signed)

## 2021-06-27 DIAGNOSIS — I10 Essential (primary) hypertension: Secondary | ICD-10-CM | POA: Diagnosis not present

## 2021-06-27 DIAGNOSIS — E785 Hyperlipidemia, unspecified: Secondary | ICD-10-CM | POA: Diagnosis not present

## 2021-06-27 DIAGNOSIS — D649 Anemia, unspecified: Secondary | ICD-10-CM | POA: Diagnosis not present

## 2021-06-27 DIAGNOSIS — M859 Disorder of bone density and structure, unspecified: Secondary | ICD-10-CM | POA: Diagnosis not present

## 2021-06-30 NOTE — Progress Notes (Signed)
Subjective: ?76 year old female presents the office today for concerns of ingrown toenail left big toe.  She states has been some ongoing discomfort and pressure on the nail particularly with something touches it.  Does not open last couple weeks.  She states that the right big toenail has come off partial insurance to make sure the nail is growing out.  She still gets the callus issue in the bottom of her feet causing discomfort. ? ?She is scheduled for upcoming removal implant on the right shoulder. ? ?Denies any fevers or chills. ? ?Objective: ?AAO x3, NAD ?DP/PT pulses palpable bilaterally, CRT less than 3 seconds ?Incurvation present left hallux toenail there is faint edema, erythema without any drainage or pus or ascending cellulitis.  Tenderness palpation along the nail border.  The right hallux nail is growing out about halfway.  There is no pain in the nail there is no edema, erythema or signs of infection. ?Diffuse calluses present submetatarsal right foot worse than left.  There is no edema, erythema or any signs of infection. ?Prominent metatarsal heads bilaterally. ?No pain with calf compression, swelling, warmth, erythema ? ?Assessment: ?Ingrown toenail left hallux, hyperkeratotic lesions prominent metatarsal heads. ? ?Plan: ?-All treatment options discussed with the patient including all alternatives, risks, complications.  ?-Sharp debride the symptomatic portion of ingrown toenail in complications or bleeding.  I discussed partial nail avulsion and she wants to hold off on this given her upcoming shoulder surgery.  Recommend Epsom salt soaks as well as topical antibiotic ointment changes daily.  We discussed an oral antibiotics but I like to try to hold off on oral antibiotics if possible in case they have any taking cultures of her shoulder from the implant removed. ?-Right hallux nail appears to be growing out.  Continue to monitor ?-Sharp debrided calluses without any complications.  Small amount  of bleeding occurred on the right side along the area of the deeper lesion but there is no laceration noted.  Area was cleaned with alcohol and antibiotic ointment and a bandage applied.  Monitor for any signs or symptoms of infection. ?-Patient encouraged to call the office with any questions, concerns, change in symptoms.  ? ?Trula Slade DPM ? ?

## 2021-07-05 DIAGNOSIS — I872 Venous insufficiency (chronic) (peripheral): Secondary | ICD-10-CM | POA: Diagnosis not present

## 2021-07-05 DIAGNOSIS — Z1212 Encounter for screening for malignant neoplasm of rectum: Secondary | ICD-10-CM | POA: Diagnosis not present

## 2021-07-05 DIAGNOSIS — D649 Anemia, unspecified: Secondary | ICD-10-CM | POA: Diagnosis not present

## 2021-07-05 DIAGNOSIS — Z0000 Encounter for general adult medical examination without abnormal findings: Secondary | ICD-10-CM | POA: Diagnosis not present

## 2021-07-05 DIAGNOSIS — E785 Hyperlipidemia, unspecified: Secondary | ICD-10-CM | POA: Diagnosis not present

## 2021-07-05 DIAGNOSIS — M25511 Pain in right shoulder: Secondary | ICD-10-CM | POA: Diagnosis not present

## 2021-07-05 DIAGNOSIS — M1990 Unspecified osteoarthritis, unspecified site: Secondary | ICD-10-CM | POA: Diagnosis not present

## 2021-07-05 DIAGNOSIS — R202 Paresthesia of skin: Secondary | ICD-10-CM | POA: Diagnosis not present

## 2021-07-05 DIAGNOSIS — M8580 Other specified disorders of bone density and structure, unspecified site: Secondary | ICD-10-CM | POA: Diagnosis not present

## 2021-07-05 DIAGNOSIS — R82998 Other abnormal findings in urine: Secondary | ICD-10-CM | POA: Diagnosis not present

## 2021-07-05 DIAGNOSIS — I10 Essential (primary) hypertension: Secondary | ICD-10-CM | POA: Diagnosis not present

## 2021-07-10 ENCOUNTER — Encounter (HOSPITAL_COMMUNITY): Payer: Medicare PPO

## 2021-07-12 ENCOUNTER — Encounter (HOSPITAL_COMMUNITY)
Admission: RE | Disposition: A | Discharge status: Home Health | Payer: Self-pay | Source: Ambulatory Visit | Attending: Orthopedic Surgery

## 2021-07-12 ENCOUNTER — Inpatient Hospital Stay: Payer: Self-pay

## 2021-07-12 ENCOUNTER — Inpatient Hospital Stay (HOSPITAL_COMMUNITY): Payer: Medicare PPO | Admitting: Anesthesiology

## 2021-07-12 ENCOUNTER — Inpatient Hospital Stay (HOSPITAL_COMMUNITY)
Admission: RE | Admit: 2021-07-12 | Discharge: 2021-07-14 | DRG: 497 | Disposition: A | Discharge status: Home Health | Payer: Medicare PPO | Source: Ambulatory Visit | Attending: Orthopedic Surgery | Admitting: Orthopedic Surgery

## 2021-07-12 ENCOUNTER — Encounter (HOSPITAL_COMMUNITY): Payer: Self-pay | Admitting: Orthopedic Surgery

## 2021-07-12 ENCOUNTER — Other Ambulatory Visit: Payer: Self-pay

## 2021-07-12 DIAGNOSIS — Z90710 Acquired absence of both cervix and uterus: Secondary | ICD-10-CM | POA: Diagnosis not present

## 2021-07-12 DIAGNOSIS — I10 Essential (primary) hypertension: Secondary | ICD-10-CM | POA: Diagnosis not present

## 2021-07-12 DIAGNOSIS — Z7982 Long term (current) use of aspirin: Secondary | ICD-10-CM

## 2021-07-12 DIAGNOSIS — Z888 Allergy status to other drugs, medicaments and biological substances status: Secondary | ICD-10-CM

## 2021-07-12 DIAGNOSIS — T8484XA Pain due to internal orthopedic prosthetic devices, implants and grafts, initial encounter: Secondary | ICD-10-CM

## 2021-07-12 DIAGNOSIS — Z79899 Other long term (current) drug therapy: Secondary | ICD-10-CM

## 2021-07-12 DIAGNOSIS — K219 Gastro-esophageal reflux disease without esophagitis: Secondary | ICD-10-CM | POA: Diagnosis present

## 2021-07-12 DIAGNOSIS — G8918 Other acute postprocedural pain: Secondary | ICD-10-CM | POA: Diagnosis not present

## 2021-07-12 DIAGNOSIS — Z881 Allergy status to other antibiotic agents status: Secondary | ICD-10-CM

## 2021-07-12 DIAGNOSIS — Z4731 Aftercare following explantation of shoulder joint prosthesis: Principal | ICD-10-CM

## 2021-07-12 DIAGNOSIS — T8459XA Infection and inflammatory reaction due to other internal joint prosthesis, initial encounter: Principal | ICD-10-CM | POA: Diagnosis present

## 2021-07-12 DIAGNOSIS — Z882 Allergy status to sulfonamides status: Secondary | ICD-10-CM

## 2021-07-12 DIAGNOSIS — E785 Hyperlipidemia, unspecified: Secondary | ICD-10-CM | POA: Diagnosis present

## 2021-07-12 DIAGNOSIS — Z8249 Family history of ischemic heart disease and other diseases of the circulatory system: Secondary | ICD-10-CM | POA: Diagnosis not present

## 2021-07-12 DIAGNOSIS — M1990 Unspecified osteoarthritis, unspecified site: Secondary | ICD-10-CM | POA: Diagnosis present

## 2021-07-12 DIAGNOSIS — Z85828 Personal history of other malignant neoplasm of skin: Secondary | ICD-10-CM | POA: Diagnosis not present

## 2021-07-12 DIAGNOSIS — Z96611 Presence of right artificial shoulder joint: Secondary | ICD-10-CM | POA: Diagnosis not present

## 2021-07-12 DIAGNOSIS — Y831 Surgical operation with implant of artificial internal device as the cause of abnormal reaction of the patient, or of later complication, without mention of misadventure at the time of the procedure: Secondary | ICD-10-CM | POA: Diagnosis present

## 2021-07-12 HISTORY — PX: EXCISIONAL TOTAL SHOULDER ARTHROPLASTY WITH ANTIBIOTIC SPACER: SHX6264

## 2021-07-12 SURGERY — REMOVAL, HARDWARE, SHOULDER, WITH IRRIGATION, DEBRIDEMENT, AND INSERTION OF ANTIBIOTIC BEADS OR ANTIBIOTIC SPACER
Anesthesia: Regional | Site: Shoulder | Laterality: Right

## 2021-07-12 MED ORDER — ACETAMINOPHEN 325 MG PO TABS
325.0000 mg | ORAL_TABLET | Freq: Four times a day (QID) | ORAL | Status: DC | PRN
  Administered 2021-07-13 – 2021-07-14 (×3): 650 mg via ORAL
  Filled 2021-07-12 (×3): qty 2

## 2021-07-12 MED ORDER — CEFAZOLIN SODIUM-DEXTROSE 2-4 GM/100ML-% IV SOLN
INTRAVENOUS | Status: AC
  Filled 2021-07-12: qty 100

## 2021-07-12 MED ORDER — VANCOMYCIN HCL 1250 MG/250ML IV SOLN: 1250.0000 mg | INTRAVENOUS | Status: DC

## 2021-07-12 MED ORDER — TRANEXAMIC ACID 1000 MG/10ML IV SOLN: 1000.0000 mg | INTRAVENOUS | Status: DC

## 2021-07-12 MED ORDER — HYDROMORPHONE HCL 1 MG/ML IJ SOLN
0.2500 mg | INTRAMUSCULAR | Status: DC | PRN
  Administered 2021-07-12: 0.5 mg via INTRAVENOUS
  Administered 2021-07-12 (×2): 0.25 mg via INTRAVENOUS
  Administered 2021-07-12: 0.5 mg via INTRAVENOUS

## 2021-07-12 MED ORDER — VANCOMYCIN HCL 1000 MG IV SOLR
INTRAVENOUS | Status: AC
  Filled 2021-07-12: qty 40

## 2021-07-12 MED ORDER — POLYETHYLENE GLYCOL 3350 17 G PO PACK: 17.0000 g | PACK | Freq: Every day | ORAL | Status: DC | PRN

## 2021-07-12 MED ORDER — ONDANSETRON HCL 4 MG PO TABS: 4.0000 mg | ORAL_TABLET | Freq: Four times a day (QID) | ORAL | Status: DC | PRN

## 2021-07-12 MED ORDER — OXYCODONE HCL 5 MG PO TABS
5.0000 mg | ORAL_TABLET | ORAL | Status: DC | PRN
  Administered 2021-07-12 (×2): 5 mg via ORAL
  Filled 2021-07-12 (×2): qty 1

## 2021-07-12 MED ORDER — METHOCARBAMOL 500 MG IVPB - SIMPLE MED
500.0000 mg | Freq: Four times a day (QID) | INTRAVENOUS | Status: DC | PRN
Start: 2021-07-12 — End: 2021-07-14
  Filled 2021-07-12: qty 50

## 2021-07-12 MED ORDER — DEXAMETHASONE SODIUM PHOSPHATE 10 MG/ML IJ SOLN
INTRAMUSCULAR | Status: DC | PRN
  Administered 2021-07-12: 10 mg via INTRAVENOUS

## 2021-07-12 MED ORDER — BUPIVACAINE LIPOSOME 1.3 % IJ SUSP
INTRAMUSCULAR | Status: DC | PRN
  Administered 2021-07-12: 10 mL via PERINEURAL

## 2021-07-12 MED ORDER — CEFAZOLIN SODIUM-DEXTROSE 2-3 GM-%(50ML) IV SOLR
INTRAVENOUS | Status: DC | PRN
  Administered 2021-07-12: 2 g via INTRAVENOUS

## 2021-07-12 MED ORDER — ONDANSETRON HCL 4 MG/2ML IJ SOLN: 4.0000 mg | Freq: Four times a day (QID) | INTRAMUSCULAR | Status: DC | PRN

## 2021-07-12 MED ORDER — CHLORHEXIDINE GLUCONATE 0.12 % MT SOLN
15.0000 mL | Freq: Once | OROMUCOSAL | Status: AC
  Administered 2021-07-12: 15 mL via OROMUCOSAL

## 2021-07-12 MED ORDER — METOCLOPRAMIDE HCL 5 MG/ML IJ SOLN: 5.0000 mg | Freq: Three times a day (TID) | INTRAMUSCULAR | Status: DC | PRN

## 2021-07-12 MED ORDER — ONDANSETRON HCL 4 MG/2ML IJ SOLN
INTRAMUSCULAR | Status: DC | PRN
  Administered 2021-07-12: 4 mg via INTRAVENOUS

## 2021-07-12 MED ORDER — 0.9 % SODIUM CHLORIDE (POUR BTL) OPTIME
TOPICAL | Status: DC | PRN
  Administered 2021-07-12: 1000 mL

## 2021-07-12 MED ORDER — TRANEXAMIC ACID-NACL 1000-0.7 MG/100ML-% IV SOLN
1000.0000 mg | INTRAVENOUS | Status: AC
  Administered 2021-07-12: 1000 mg via INTRAVENOUS
  Filled 2021-07-12: qty 100

## 2021-07-12 MED ORDER — HYDROMORPHONE HCL 1 MG/ML IJ SOLN
INTRAMUSCULAR | Status: AC
  Administered 2021-07-12: 0.5 mg via INTRAVENOUS
  Filled 2021-07-12: qty 2

## 2021-07-12 MED ORDER — METHOCARBAMOL 500 MG IVPB - SIMPLE MED
INTRAVENOUS | Status: AC
  Administered 2021-07-12: 500 mg via INTRAVENOUS
  Filled 2021-07-12: qty 50

## 2021-07-12 MED ORDER — ATENOLOL 50 MG PO TABS
50.0000 mg | ORAL_TABLET | Freq: Every day | ORAL | Status: DC
  Administered 2021-07-12 – 2021-07-14 (×2): 50 mg via ORAL
  Filled 2021-07-12 (×3): qty 1

## 2021-07-12 MED ORDER — DIPHENHYDRAMINE HCL 12.5 MG/5ML PO ELIX: 12.5000 mg | ORAL_SOLUTION | ORAL | Status: DC | PRN

## 2021-07-12 MED ORDER — ORAL CARE MOUTH RINSE: 15.0000 mL | Freq: Once | OROMUCOSAL | Status: AC

## 2021-07-12 MED ORDER — OXYCODONE HCL 5 MG PO TABS
ORAL_TABLET | ORAL | Status: AC
  Administered 2021-07-12: 5 mg via ORAL
  Filled 2021-07-12: qty 1

## 2021-07-12 MED ORDER — PROPOFOL 10 MG/ML IV BOLUS
INTRAVENOUS | Status: DC | PRN
  Administered 2021-07-12: 100 mg via INTRAVENOUS
  Administered 2021-07-12: 50 mg via INTRAVENOUS
  Administered 2021-07-12: 40 mg via INTRAVENOUS

## 2021-07-12 MED ORDER — SODIUM CHLORIDE 0.9 % IR SOLN
Status: DC | PRN
  Administered 2021-07-12 (×2): 1000 mL

## 2021-07-12 MED ORDER — VANCOMYCIN HCL 500 MG/100ML IV SOLN
500.0000 mg | Freq: Once | INTRAVENOUS | Status: AC
  Administered 2021-07-12: 500 mg via INTRAVENOUS
  Filled 2021-07-12: qty 100

## 2021-07-12 MED ORDER — STERILE WATER FOR IRRIGATION IR SOLN
Status: DC | PRN
Start: 2021-07-12 — End: 2021-07-12
  Administered 2021-07-12: 1000 mL

## 2021-07-12 MED ORDER — OXYCODONE HCL 5 MG PO TABS
10.0000 mg | ORAL_TABLET | ORAL | Status: DC | PRN
  Administered 2021-07-13 (×4): 10 mg via ORAL
  Filled 2021-07-12 (×4): qty 2

## 2021-07-12 MED ORDER — ONDANSETRON HCL 4 MG/2ML IJ SOLN: 4.0000 mg | Freq: Once | INTRAMUSCULAR | Status: DC | PRN

## 2021-07-12 MED ORDER — VANCOMYCIN HCL IN DEXTROSE 1-5 GM/200ML-% IV SOLN: 1000.0000 mg | Freq: Two times a day (BID) | INTRAVENOUS | Status: DC

## 2021-07-12 MED ORDER — VANCOMYCIN HCL IN DEXTROSE 1-5 GM/200ML-% IV SOLN
INTRAVENOUS | Status: AC
  Filled 2021-07-12: qty 200

## 2021-07-12 MED ORDER — HYDRALAZINE HCL 20 MG/ML IJ SOLN: 5.0000 mg | INTRAMUSCULAR | Status: DC | PRN

## 2021-07-12 MED ORDER — PHENYLEPHRINE HCL-NACL 20-0.9 MG/250ML-% IV SOLN
INTRAVENOUS | Status: DC | PRN
Start: 2021-07-12 — End: 2021-07-12
  Administered 2021-07-12: 40 ug/min via INTRAVENOUS

## 2021-07-12 MED ORDER — PANTOPRAZOLE SODIUM 40 MG PO TBEC
40.0000 mg | DELAYED_RELEASE_TABLET | Freq: Every day | ORAL | Status: DC
  Administered 2021-07-13 – 2021-07-14 (×2): 40 mg via ORAL
  Filled 2021-07-12 (×2): qty 1

## 2021-07-12 MED ORDER — HYDRALAZINE HCL 20 MG/ML IJ SOLN
INTRAMUSCULAR | Status: AC
  Administered 2021-07-12: 5 mg via INTRAVENOUS
  Filled 2021-07-12: qty 1

## 2021-07-12 MED ORDER — OXYCODONE HCL 5 MG PO TABS: 5.0000 mg | ORAL_TABLET | Freq: Once | ORAL | Status: AC | PRN

## 2021-07-12 MED ORDER — LIDOCAINE 2% (20 MG/ML) 5 ML SYRINGE
INTRAMUSCULAR | Status: DC | PRN
  Administered 2021-07-12: 20 mg via INTRAVENOUS

## 2021-07-12 MED ORDER — SODIUM CHLORIDE 0.9 % IV SOLN
INTRAVENOUS | Status: DC | PRN
  Administered 2021-07-12: 1000 mg via INTRAVENOUS

## 2021-07-12 MED ORDER — ROCURONIUM BROMIDE 10 MG/ML (PF) SYRINGE
PREFILLED_SYRINGE | INTRAVENOUS | Status: DC | PRN
  Administered 2021-07-12: 100 mg via INTRAVENOUS

## 2021-07-12 MED ORDER — TOBRAMYCIN SULFATE 1.2 G IJ SOLR
INTRAMUSCULAR | Status: AC
  Filled 2021-07-12: qty 2.4

## 2021-07-12 MED ORDER — PRAMIPEXOLE DIHYDROCHLORIDE 0.25 MG PO TABS
0.5000 mg | ORAL_TABLET | Freq: Every day | ORAL | Status: DC
  Administered 2021-07-12 – 2021-07-13 (×2): 0.5 mg via ORAL
  Filled 2021-07-12 (×2): qty 2

## 2021-07-12 MED ORDER — LACTATED RINGERS IV SOLN: INTRAVENOUS | Status: DC

## 2021-07-12 MED ORDER — SODIUM CHLORIDE 0.9 % IV SOLN
2.0000 g | INTRAVENOUS | Status: DC
  Administered 2021-07-12 – 2021-07-14 (×3): 2 g via INTRAVENOUS
  Filled 2021-07-12 (×3): qty 20

## 2021-07-12 MED ORDER — DOCUSATE SODIUM 100 MG PO CAPS
100.0000 mg | ORAL_CAPSULE | Freq: Two times a day (BID) | ORAL | Status: DC
  Administered 2021-07-12 – 2021-07-14 (×4): 100 mg via ORAL
  Filled 2021-07-12 (×4): qty 1

## 2021-07-12 MED ORDER — METOCLOPRAMIDE HCL 5 MG PO TABS: 5.0000 mg | ORAL_TABLET | Freq: Three times a day (TID) | ORAL | Status: DC | PRN

## 2021-07-12 MED ORDER — MENTHOL 3 MG MT LOZG: 1.0000 | LOZENGE | OROMUCOSAL | Status: DC | PRN

## 2021-07-12 MED ORDER — LOSARTAN POTASSIUM 50 MG PO TABS
50.0000 mg | ORAL_TABLET | Freq: Every day | ORAL | Status: DC
  Administered 2021-07-14: 50 mg via ORAL
  Filled 2021-07-12 (×2): qty 1

## 2021-07-12 MED ORDER — METHOCARBAMOL 500 MG PO TABS
500.0000 mg | ORAL_TABLET | Freq: Four times a day (QID) | ORAL | Status: DC | PRN
  Administered 2021-07-13 – 2021-07-14 (×5): 500 mg via ORAL
  Filled 2021-07-12 (×5): qty 1

## 2021-07-12 MED ORDER — OXYCODONE HCL 5 MG/5ML PO SOLN: 5.0000 mg | Freq: Once | ORAL | Status: AC | PRN

## 2021-07-12 MED ORDER — FENTANYL CITRATE PF 50 MCG/ML IJ SOSY: 50.0000 ug | PREFILLED_SYRINGE | INTRAMUSCULAR | Status: DC

## 2021-07-12 MED ORDER — PHENOL 1.4 % MT LIQD
1.0000 | OROMUCOSAL | Status: DC | PRN
Start: 2021-07-12 — End: 2021-07-14

## 2021-07-12 MED ORDER — PHENYLEPHRINE 40 MCG/ML (10ML) SYRINGE FOR IV PUSH (FOR BLOOD PRESSURE SUPPORT)
PREFILLED_SYRINGE | INTRAVENOUS | Status: DC | PRN
Start: 2021-07-12 — End: 2021-07-12
  Administered 2021-07-12: 80 ug via INTRAVENOUS

## 2021-07-12 MED ORDER — MELOXICAM 15 MG PO TABS
15.0000 mg | ORAL_TABLET | Freq: Every day | ORAL | Status: DC
  Administered 2021-07-12 – 2021-07-13 (×2): 15 mg via ORAL
  Filled 2021-07-12 (×2): qty 1

## 2021-07-12 MED ORDER — AMISULPRIDE (ANTIEMETIC) 5 MG/2ML IV SOLN: 10.0000 mg | Freq: Once | INTRAVENOUS | Status: DC | PRN

## 2021-07-12 MED ORDER — ATENOLOL-CHLORTHALIDONE 100-25 MG PO TABS
0.5000 | ORAL_TABLET | Freq: Every day | ORAL | Status: DC
Start: 2021-07-12 — End: 2021-07-12

## 2021-07-12 MED ORDER — VANCOMYCIN HCL 1000 MG IV SOLR
INTRAVENOUS | Status: DC | PRN
  Administered 2021-07-12: 1000 mg

## 2021-07-12 MED ORDER — CHLORTHALIDONE 25 MG PO TABS
12.5000 mg | ORAL_TABLET | Freq: Every day | ORAL | Status: DC
Start: 2021-07-12 — End: 2021-07-14
  Administered 2021-07-12 – 2021-07-14 (×2): 12.5 mg via ORAL
  Filled 2021-07-12 (×3): qty 1

## 2021-07-12 MED ORDER — SUGAMMADEX SODIUM 200 MG/2ML IV SOLN
INTRAVENOUS | Status: DC | PRN
  Administered 2021-07-12: 200 mg via INTRAVENOUS

## 2021-07-12 MED ORDER — FENTANYL CITRATE PF 50 MCG/ML IJ SOSY
PREFILLED_SYRINGE | INTRAMUSCULAR | Status: AC
  Administered 2021-07-12: 100 ug via INTRAVENOUS
  Filled 2021-07-12: qty 2

## 2021-07-12 MED ORDER — BISACODYL 5 MG PO TBEC: 5.0000 mg | DELAYED_RELEASE_TABLET | Freq: Every day | ORAL | Status: DC | PRN

## 2021-07-12 MED ORDER — BUPIVACAINE HCL (PF) 0.5 % IJ SOLN
INTRAMUSCULAR | Status: DC | PRN
  Administered 2021-07-12: 15 mL via PERINEURAL

## 2021-07-12 MED ORDER — HYDROMORPHONE HCL 1 MG/ML IJ SOLN
0.5000 mg | INTRAMUSCULAR | Status: DC | PRN
  Administered 2021-07-13: 1 mg via INTRAVENOUS
  Filled 2021-07-12 (×2): qty 1

## 2021-07-12 MED ORDER — ASPIRIN EC 81 MG PO TBEC
81.0000 mg | DELAYED_RELEASE_TABLET | Freq: Every day | ORAL | Status: DC
  Administered 2021-07-13 – 2021-07-14 (×2): 81 mg via ORAL
  Filled 2021-07-12 (×2): qty 1

## 2021-07-12 SURGICAL SUPPLY — 69 items
BAG COUNTER SPONGE SURGICOUNT (BAG) IMPLANT
BAG ZIPLOCK 12X15 (MISCELLANEOUS) ×2 IMPLANT
BLADE SAW SGTL 83.5X18.5 (BLADE) ×2 IMPLANT
BNDG COHESIVE 4X5 TAN ST LF (GAUZE/BANDAGES/DRESSINGS) ×2 IMPLANT
BOWL SMART MIX CTS (DISPOSABLE) ×1 IMPLANT
CEMENT GENTAMICIN CEMEX 40G (Cement) ×1 IMPLANT
COOLER ICEMAN CLASSIC (MISCELLANEOUS) ×2 IMPLANT
COVER BACK TABLE 60X90IN (DRAPES) ×2 IMPLANT
COVER SURGICAL LIGHT HANDLE (MISCELLANEOUS) ×2 IMPLANT
DERMABOND ADVANCED (GAUZE/BANDAGES/DRESSINGS) ×1
DERMABOND ADVANCED .7 DNX12 (GAUZE/BANDAGES/DRESSINGS) ×1 IMPLANT
DRAPE INCISE IOBAN 66X45 STRL (DRAPES) IMPLANT
DRAPE ORTHO SPLIT 77X108 STRL (DRAPES) ×4
DRAPE SHEET LG 3/4 BI-LAMINATE (DRAPES) ×2 IMPLANT
DRAPE SURG 17X11 SM STRL (DRAPES) ×2 IMPLANT
DRAPE SURG ORHT 6 SPLT 77X108 (DRAPES) ×2 IMPLANT
DRAPE TOP 10253 STERILE (DRAPES) ×2 IMPLANT
DRAPE U-SHAPE 47X51 STRL (DRAPES) ×2 IMPLANT
DRESSING AQUACEL AG SP 3.5X6 (GAUZE/BANDAGES/DRESSINGS) ×1 IMPLANT
DRSG AQUACEL AG ADV 3.5X10 (GAUZE/BANDAGES/DRESSINGS) ×1 IMPLANT
DRSG AQUACEL AG SP 3.5X6 (GAUZE/BANDAGES/DRESSINGS) ×2
DRSG TEGADERM 8X12 (GAUZE/BANDAGES/DRESSINGS) ×2 IMPLANT
DURAPREP 26ML APPLICATOR (WOUND CARE) ×2 IMPLANT
ELECT BLADE TIP CTD 4 INCH (ELECTRODE) ×2 IMPLANT
ELECT PENCIL ROCKER SW 15FT (MISCELLANEOUS) ×2 IMPLANT
ELECT REM PT RETURN 15FT ADLT (MISCELLANEOUS) ×2 IMPLANT
FACESHIELD WRAPAROUND (MASK) ×8 IMPLANT
FACESHIELD WRAPAROUND OR TEAM (MASK) ×4 IMPLANT
GLOVE SRG 8 PF TXTR STRL LF DI (GLOVE) ×1 IMPLANT
GLOVE SURG ENC MOIS LTX SZ7 (GLOVE) ×2 IMPLANT
GLOVE SURG ENC MOIS LTX SZ7.5 (GLOVE) ×2 IMPLANT
GLOVE SURG UNDER POLY LF SZ7 (GLOVE) ×2 IMPLANT
GLOVE SURG UNDER POLY LF SZ8 (GLOVE) ×2
GOWN STRL REUS W/ TWL LRG LVL3 (GOWN DISPOSABLE) ×2 IMPLANT
GOWN STRL REUS W/TWL LRG LVL3 (GOWN DISPOSABLE) ×4
HANDPIECE INTERPULSE COAX TIP (DISPOSABLE) ×2
KIT BASIN OR (CUSTOM PROCEDURE TRAY) ×2 IMPLANT
KIT SHOULDER SPACER 11MM STEM (Shoulder) ×1 IMPLANT
KIT TURNOVER KIT A (KITS) IMPLANT
MANIFOLD NEPTUNE II (INSTRUMENTS) ×2 IMPLANT
NDL TAPERED W/ NITINOL LOOP (MISCELLANEOUS) ×1 IMPLANT
NEEDLE TAPERED W/ NITINOL LOOP (MISCELLANEOUS) ×2 IMPLANT
NS IRRIG 1000ML POUR BTL (IV SOLUTION) ×2 IMPLANT
PACK SHOULDER (CUSTOM PROCEDURE TRAY) ×2 IMPLANT
PAD ARMBOARD 7.5X6 YLW CONV (MISCELLANEOUS) ×2 IMPLANT
PAD COLD SHLDR WRAP-ON (PAD) ×2 IMPLANT
PIN NITINOL TARGETER 2.8 (PIN) IMPLANT
PIN SET MODULAR GLENOID SYSTEM (PIN) IMPLANT
RESTRAINT HEAD UNIVERSAL NS (MISCELLANEOUS) ×2 IMPLANT
SET HNDPC FAN SPRY TIP SCT (DISPOSABLE) IMPLANT
SLING ARM FOAM STRAP LRG (SOFTGOODS) ×1 IMPLANT
SLING ARM FOAM STRAP MED (SOFTGOODS) IMPLANT
SPONGE T-LAP 18X18 ~~LOC~~+RFID (SPONGE) ×2 IMPLANT
SPONGE T-LAP 4X18 ~~LOC~~+RFID (SPONGE) ×2 IMPLANT
SUCTION FRAZIER HANDLE 12FR (TUBING) ×2
SUCTION TUBE FRAZIER 12FR DISP (TUBING) ×1 IMPLANT
SUT FIBERWIRE #2 38 T-5 BLUE (SUTURE)
SUT MNCRL AB 3-0 PS2 18 (SUTURE) ×2 IMPLANT
SUT MON AB 2-0 CT1 36 (SUTURE) ×2 IMPLANT
SUT PDS AB 1 CT1 27 (SUTURE) ×1 IMPLANT
SUT VIC AB 1 CT1 36 (SUTURE) ×2 IMPLANT
SUTURE FIBERWR #2 38 T-5 BLUE (SUTURE) IMPLANT
SUTURE TAPE 1.3 40 TPR END (SUTURE) ×2 IMPLANT
SUTURETAPE 1.3 40 TPR END (SUTURE) ×4
TOWEL OR 17X26 10 PK STRL BLUE (TOWEL DISPOSABLE) ×2 IMPLANT
TOWEL OR NON WOVEN STRL DISP B (DISPOSABLE) ×2 IMPLANT
TUBE SUCTION HIGH CAP CLEAR NV (SUCTIONS) ×2 IMPLANT
WATER STERILE IRR 1000ML POUR (IV SOLUTION) ×4 IMPLANT
YANKAUER SUCT BULB TIP 10FT TU (MISCELLANEOUS) ×2 IMPLANT

## 2021-07-12 NOTE — Anesthesia Postprocedure Evaluation (Signed)
Anesthesia Post Note ? ?Patient: Natalie [name] ? ?Procedure(s) Performed: Removal Right Reverse shoulder arthroplasty and placement of antibiotic spacer (Right: Shoulder) ? ?  ? ?Patient location during evaluation: PACU ?Anesthesia Type: Regional and General ?Level of consciousness: awake and alert, oriented and patient cooperative ?Pain management: pain level controlled ?Vital Signs Assessment: post-procedure vital signs reviewed and stable ?Respiratory status: spontaneous breathing, nonlabored ventilation and respiratory function stable ?Cardiovascular status: blood pressure returned to baseline and stable ?Postop Assessment: no apparent nausea or vomiting ?Anesthetic complications: no ? ? ?No notable events documented. ? ?Last Vitals:  ?Vitals:  ? 07/12/21 1600 07/12/21 1621  ?BP: (!) 165/69 (!) 171/71  ?Pulse: 74 75  ?Resp: 20   ?Temp: 36.7 ?C   ?SpO2: 99% 95%  ?  ?Last Pain:  ?Vitals:  ? 07/12/21 1621  ?TempSrc:   ?PainSc: 4   ? ? ?  ?  ?  ?  ?  ?  ? ?Jarome Matin Devin Ganaway ? ? ? ? ?

## 2021-07-12 NOTE — Progress Notes (Signed)
Arrived to discuss PICC. Patient had anestheia earlier today. Wanted to wait until she could discuss with her spouse in AM. VAS Team to follow up on 3-24 AM.   ?

## 2021-07-12 NOTE — Anesthesia Preprocedure Evaluation (Addendum)
Anesthesia Evaluation  ?Patient identified by MRN, date of birth, ID band ?Patient awake ? ? ? ?Reviewed: ?Allergy & Precautions, NPO status , Patient's Chart, lab work & pertinent test results, reviewed documented beta blocker date and time  ? ?History of Anesthesia Complications ?Negative for: history of anesthetic complications ? ?Airway ?Mallampati: III ? ?TM Distance: >3 FB ?Neck ROM: Full ? ? ? Dental ? ?(+) Teeth Intact, Dental Advisory Given ?  ?Pulmonary ?neg pulmonary ROS,  ?  ?Pulmonary exam normal ?breath sounds clear to auscultation ? ? ? ? ? ? Cardiovascular ?hypertension, Pt. on medications and Pt. on home beta blockers ?Normal cardiovascular exam ?Rhythm:Regular Rate:Normal ? ? ?  ?Neuro/Psych ? Headaches, negative psych ROS  ? GI/Hepatic ?Neg liver ROS, GERD  Medicated and Controlled,  ?Endo/Other  ?negative endocrine ROS ? Renal/GU ?negative Renal ROS  ?negative genitourinary ?  ?Musculoskeletal ? ?(+) Arthritis , Osteoarthritis,  Painful R reverse shoulder  ? Abdominal ?  ?Peds ? Hematology ?negative hematology ROS ?(+)   ?Anesthesia Other Findings ? ? Reproductive/Obstetrics ?negative OB ROS ? ?  ? ? ? ? ? ? ? ? ? ? ? ? ? ?  ?  ? ? ? ? ? ? ? ?Anesthesia Physical ?Anesthesia Plan ? ?ASA: 2 ? ?Anesthesia Plan: General and Regional  ? ?Post-op Pain Management: Regional block*  ? ?Induction: Intravenous ? ?PONV Risk Score and Plan: 4 or greater and Ondansetron, Dexamethasone and Treatment may vary due to age or medical condition ? ?Airway Management Planned: Oral ETT ? ?Additional Equipment: None ? ?Intra-op Plan:  ? ?Post-operative Plan: Extubation in OR ? ?Informed Consent: I have reviewed the patients History and Physical, chart, labs and discussed the procedure including the risks, benefits and alternatives for the proposed anesthesia with the patient or authorized representative who has indicated his/her understanding and acceptance.  ? ? ? ?Dental advisory  given ? ?Plan Discussed with: CRNA ? ?Anesthesia Plan Comments: (Last shoulder surgery 08/2020: Laryngoscope Size: Mac and 3 ?Grade View: Grade I ?Tube type: Oral ?Tube size: 7.0 mm ?Number of attempts: 1)  ? ? ? ? ? ?Anesthesia Quick Evaluation ? ?

## 2021-07-12 NOTE — Anesthesia Procedure Notes (Signed)
Procedure Name: Intubation ?Date/Time: 07/12/2021 12:32 PM ?Performed by: [place] Tyshawn Keel C, CRNA ?Pre-anesthesia Checklist: Patient identified, Emergency Drugs available, Suction available and Patient being monitored ?Patient Re-evaluated:Patient Re-evaluated prior to induction ?Oxygen Delivery Method: Circle system utilized ?Preoxygenation: Pre-oxygenation with 100% oxygen ?Induction Type: IV induction ?Ventilation: Mask ventilation without difficulty ?Laryngoscope Size: Mac and 3 ?Grade View: Grade I ?Tube type: Oral ?Tube size: 7.0 mm ?Number of attempts: 1 ?Airway Equipment and Method: Stylet and Oral airway ?Placement Confirmation: ETT inserted through vocal cords under direct vision, positive ETCO2 and breath sounds checked- equal and bilateral ?Secured at: 20 cm ?Tube secured with: Tape ?Dental Injury: Teeth and Oropharynx as per pre-operative assessment  ? ? ? ? ?

## 2021-07-12 NOTE — Progress Notes (Signed)
Pharmacy Antibiotic Note ? ?Natalie [name] is a 76 y.o. female admitted on 07/12/2021 with  right shoulder PJI .  Pharmacy has been consulted for vancomycin dosing. ? ?Plan: ?Vanc 1g IV x 1 give preop at 1323, give '500mg'$  IV x 1 to give '1500mg'$  total for today then start '1250mg'$  IV q36 - goal AUC 400-550 ?Rocephin per Md ? ?Height: '5\' 2"'$  (157.5 cm) ?Weight: 67.5 kg (148 lb 12.8 oz) ?IBW/kg (Calculated) : 50.1 ? ?Temp (24hrs), Avg:97.8 ?F (36.6 ?C), Min:97.5 ?F (36.4 ?C), Max:98 ?F (36.7 ?C) ? ?No results for input(s): WBC, CREATININE, LATICACIDVEN, VANCOTROUGH, VANCOPEAK, VANCORANDOM, GENTTROUGH, GENTPEAK, GENTRANDOM, TOBRATROUGH, TOBRAPEAK, TOBRARND, AMIKACINPEAK, AMIKACINTROU, AMIKACIN in the last 168 hours.  ?CrCl cannot be calculated (Patient's most recent lab result is older than the maximum 21 days allowed.).   ? ?Allergies  ?Allergen Reactions  ? Cephalexin   ?  Can not recall reaction  ? Nitrofurantoin Monohyd Macro   ?  Call not recall reaction  ? Sulfa Antibiotics Nausea Only  ? ? ? ?Thank you for allowing pharmacy to be a part of this patient?s care. ? ?Kara Mead ?07/12/2021 4:38 PM ? ?

## 2021-07-12 NOTE — Plan of Care (Signed)
?  Problem: Education: Goal: Knowledge of the prescribed therapeutic regimen will improve Outcome: Progressing   Problem: Activity: Goal: Ability to tolerate increased activity will improve Outcome: Progressing   Problem: Pain Management: Goal: Pain level will decrease with appropriate interventions Outcome: Progressing   Problem: Safety: Goal: Ability to remain free from injury will improve Outcome: Progressing   

## 2021-07-12 NOTE — Transfer of Care (Signed)
Immediate Anesthesia Transfer of Care Note ? ?Patient: Natalie [name] ? ?Procedure(s) Performed: Removal Right Reverse shoulder arthroplasty and placement of antibiotic spacer (Right: Shoulder) ? ?Patient Location: PACU ? ?Anesthesia Type:GA combined with regional for post-op pain ? ?Level of Consciousness: awake, alert  and oriented ? ?Airway & Oxygen Therapy: Patient Spontanous Breathing and Patient connected to face mask oxygen ? ?Post-op Assessment: Report given to RN and Post -op Vital signs reviewed and stable ? ?Post vital signs: Reviewed and stable ? ?Last Vitals:  ?Vitals Value Taken Time  ?BP 148/85 07/12/21 1430  ?Temp 36.6 ?C 07/12/21 1430  ?Pulse 69 07/12/21 1434  ?Resp 21 07/12/21 1434  ?SpO2 100 % 07/12/21 1434  ?Vitals shown include unvalidated device data. ? ?Last Pain:  ?Vitals:  ? 07/12/21 1023  ?TempSrc: Oral  ?PainSc:   ?   ? ?Patients Stated Pain Goal: 4 (07/12/21 1017) ? ?Complications: No notable events documented. ?

## 2021-07-12 NOTE — Anesthesia Procedure Notes (Signed)
Anesthesia Regional Block: Interscalene brachial plexus block  ? ?Pre-Anesthetic Checklist: , timeout performed,  Correct Patient, Correct Site, Correct Laterality,  Correct Procedure, Correct Position, site marked,  Risks and benefits discussed,  Surgical consent,  Pre-op evaluation,  At surgeon's request and post-op pain management ? ?Laterality: Right ? ?Prep: Maximum Sterile Barrier Precautions used, chloraprep     ?  ?Needles:  ?Injection technique: Single-shot ? ?Needle Type: Echogenic Stimulator Needle   ? ? ?Needle Length: 9cm  ?Needle Gauge: 22  ? ? ? ?Additional Needles: ? ? ?Procedures:,,,, ultrasound used (permanent image in chart),,    ?Narrative:  ?Start time: 07/12/2021 11:35 AM ?End time: 07/12/2021 11:40 AM ?Injection made incrementally with aspirations every 5 mL. ? ?Performed by: Personally  ?Anesthesiologist: Pervis Hocking, DO ? ?Additional Notes: ?Monitors applied. No increased pain on injection. No increased resistance to injection. Injection made in 5cc increments. Good needle visualization. Patient tolerated procedure well.  ? ? ? ? ?

## 2021-07-12 NOTE — Progress Notes (Signed)
ID Progress Note: ? ?Will see formally in the morning: ? ?76yo F with painful Rt reverse shoulder arthroplasty with concern for periprosthetic infection, she underwent explantation and implantation of abtx impregnated cement spacer. ? ?Recommend to start on vancomycin plus ceftriaxone 2gm iv daily for the timebeing and will follow up on cultures. ? ?Elzie Rings Baxter Flattery MD MPH ?[place] ?[phone] ? ?

## 2021-07-12 NOTE — H&P (Signed)
Natalie [name]   ? ?Chief Complaint: Painful Rt reverse shoulder arthroplasty ?HPI: The patient is a 76 y.o. female with a long history of right shoulder pain related to a rotator cuff tear and arthropathy, status post a right shoulder reverse arthroplasty performed back in May 2022.  Postoperatively the patient has unfortunately continued to have significant right shoulder pain without an easily identifiable source.  She has undergone extensive work-ups to rule out loosening versus infection and all work-ups to date have been negative.  However the patient continues to have excruciating shoulder pain and concern is that she likely has a deep-seated infection and she is brought to the operating room at this time for planned explantation of her prosthesis and the placement of an antibiotic impregnated spacer.  Anticipate a standard infection treatment algorithm with IV antibiotics for 6 weeks.  We will obtain infectious disease consultation.  Multiple cultures will be taken at the time of surgery. ? ?Past Medical History:  ?Diagnosis Date  ? Arthritis   ? Back pain   ? Cancer [facility])   ? Change in hearing   ? Generalized headaches   ? GERD (gastroesophageal reflux disease)   ? Heart murmur   ? Pt denies  ? Hyperlipidemia   ? Hypertension   ? Joint pain   ? Leg pain   ? Osteopenia 04/2015  ? T score -1.9 FRAX 12%/2.6% stable from prior DEXA  ? PONV (postoperative nausea and vomiting)   ? Skin cancer   ? Varicose veins of leg with pain Left  > Right  ? ? ?Past Surgical History:  ?Procedure Laterality Date  ? ABDOMINAL HYSTERECTOMY  1982  ? TAH BSO  ? arm surgery  1998 right arm  ? CESAREAN SECTION    ? ECTOPIC PREGNANCY SURGERY    ? ENDOVENOUS ABLATION SAPHENOUS VEIN W/ LASER  left leg 10 years ago per pt.  ? EXPLORATORY TYMPANOTOMY    ? FINGER SURGERY  2007  right hand 4th and 5th digits  ? REVERSE SHOULDER ARTHROPLASTY Right 09/07/2020  ? Procedure: REVERSE SHOULDER ARTHROPLASTY;  Surgeon: Justice Britain, MD;   Location: [facility] ORS;  Service: Orthopedics;  Laterality: Right;  183mn  ? SHOULDER SURGERY  2019  ? STAPEDECTOMY  2001  ? STAPEDECTOMY  revision 2002  ? TONSILLECTOMY  1954  ? VEIN LIGATION AND STRIPPING  9-12  ? LEFT- RIGHT LEG INJECTED FOR SPIDER VEINS  ? ? ?Family History  ?Problem Relation Age of Onset  ? Heart disease Mother   ? Hypertension Mother   ? Heart disease Father   ? Hypertension Father   ? Diabetes Daughter   ? Heart Problems Daughter   ? ? ?Social History:  reports that she has never smoked. She has never used smokeless tobacco. She reports that she does not drink alcohol and does not use drugs. ? ? ?Medications Prior to Admission  ?Medication Sig Dispense Refill  ? acetaminophen (TYLENOL) 325 MG tablet Take 650 mg by mouth every 6 (six) hours as needed for moderate pain.    ? aspirin 81 MG tablet Take 81 mg by mouth daily.    ? atenolol-chlorthalidone (TENORETIC) 100-25 MG per tablet Take 0.5 tablets by mouth daily.    ? atorvastatin (LIPITOR) 20 MG tablet Take 20 mg by mouth at bedtime.    ? Cholecalciferol (VITAMIN D) 125 MCG (5000 UT) CAPS Take 5,000 Units by mouth daily.    ? hydroxypropyl methylcellulose / hypromellose (ISOPTO TEARS / GONIOVISC) 2.5 %  ophthalmic solution Place 1 drop into both eyes as needed for dry eyes.    ? losartan (COZAAR) 50 MG tablet Take 50 mg by mouth daily.    ? meloxicam (MOBIC) 15 MG tablet Take 1 tablet (15 mg total) by mouth at bedtime. 30 tablet 1  ? omeprazole (PRILOSEC) 20 MG capsule TAKE 1 CAPSULE (20 MG TOTAL) TWO TIMES DAILY. 180 capsule 0  ? pramipexole (MIRAPEX) 0.5 MG tablet Take 0.5 mg by mouth at bedtime.    ? betamethasone valerate ointment (VALISONE) 0.1 % Apply 1 application topically 2 (two) times daily. 30 g 1  ? mupirocin ointment (BACTROBAN) 2 % Apply 1 application. topically 2 (two) times daily. 30 g 2  ? ondansetron (ZOFRAN) 4 MG tablet Take 1 tablet (4 mg total) by mouth every 8 (eight) hours as needed for nausea or vomiting. (Patient not  taking: Reported on 03/27/2021) 10 tablet 0  ? ? ? ?Physical Exam: Right shoulder incision is well-healed.  There is no erythema or induration.  She has significantly painful and guarded motion is noted at her multiple recent office visits.  Her strength also is globally decreased secondary to pain and guarding. ? ?Recent x-rays are reviewed showing her implant to be in overall good position and alignment. ? ?Work-up included inflammatory parameters all of which are within normal limits.  Recent bone scan did not show obvious evidence for loosening or migration or evidence for periprosthetic loosening or infection. ? ?Vitals ? ?Temp:  [97.5 ?F (36.4 ?C)] 97.5 ?F (36.4 ?C) (03/23 1023) ?Pulse Rate:  [85] 85 (03/23 1023) ?Resp:  [17] 17 (03/23 1023) ?BP: (168)/(73) 168/73 (03/23 1023) ?SpO2:  [100 %] 100 % (03/23 1023) ?Weight:  [67.5 kg] 67.5 kg (03/23 1017) ? ?Assessment/Plan ? ?Impression: Painful Rt reverse shoulder arthroplasty ? ?Plan of Action: Procedure(s): ?Removal Right Reverse shoulder arthroplasty and placement of antibiotic spacer ? ?Boniface Goffe M Nashea Chumney ?07/12/2021, 11:31 AM ?Contact # [phone] ? ? ? ? ? ?  ?

## 2021-07-12 NOTE — Op Note (Signed)
07/12/2021 ? ?2:21 PM ? ?PATIENT:   Natalie [name]  76 y.o. female ? ?PRE-OPERATIVE DIAGNOSIS:  Painful Rt reverse shoulder arthroplasty ? 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 ?PROCEDURE: Exploration, irrigation, and debridement of right shoulder with explantation of reverse shoulder arthroplasty.  Subsequent implantation of an antibiotic impregnated cement spacer from [employer].  Spacer impregnated with gentamicin and the spacer was cemented into position with a cement that had a combination of both gentamicin and vancomycin. ? ?SURGEON:  Righteous Claiborne, Metta Clines. M.D. ? ?ASSISTANTS: Jenetta Loges, PA-C ? ?ANESTHESIA:   General endotracheal and interscalene block with Exparel ? ?EBL: 150 cc ? ?SPECIMEN: Tissue specimens as well as fluid cultures were sent for a total of 5 samples.  These include: ? ?1.  Joint fluid ? ?2.  Superficial periprosthetic tissue ? ?3.  Deep humeral periprosthetic tissue ? ?4.  Humeral canal ? ?5.  Glenoid vault ? ?Drains: None ? ? ?PATIENT DISPOSITION:  PACU - hemodynamically stable. ? ? ? ?PLAN OF CARE: Admit for overnight observation ? ?Brief history: ? ?Patient is a 76 year old female who has been followed for chronic right shoulder pain related to rotator cuff tear arthropathy.  She underwent an index right shoulder reverse arthroplasty back in May 2022.  Unfortunately patient has continued to have significant right shoulder pain postoperatively.  She has undergone an extensive work-up which has showed negative inflammatory parameters and a bone scan which has not shown any obvious change to suggest loosening or periprosthetic infection.  However her symptoms have progressively deteriorated and given the clinical scenario at this time she is brought to the operating room for anticipated removal of her implant irrigation and debridement and placement of an antibiotic impregnated cement spacer. ? ?Preoperatively I counseled the  patient regarding treatment options as well as potential risks versus benefits thereof.  Possible surgical complications were reviewed including the potential for bleeding, infection, neurovascular injury, persistent pain, persistence of infection, anesthetic complication, and the possible need for additional surgery.  She understands, and accepts, and agrees with our planned procedure. ? ?Procedure detail: ? ?After undergoing routine preop evaluation patient received prophylactic antibiotics and interscalene block with Exparel was established in the holding area by the anesthesia department.  Subsequently placed spine on the operating table and underwent the smooth induction of a general endotracheal anesthesia.  Placed into the beachchair position and appropriately padded and protected.  The right shoulder girdle region was sterilely prepped and draped in standard fashion.  Timeout was called.  We outlined our incision following the previous anterior shoulder incision and sharp dissection was carried through the skin and subcu tissue and electrocautery was used for hemostasis.  Total length approximate 12 cm.  Skin flaps were elevated and mobilized.  The underlying deltopectoral interval was identified and developed and the cephalic vein was significantly scarred fied and was ligated to assist in dissection.  The interval was then developed from proximal to distal.  Adhesions were then divided beneath the deltoid.  We then entered the joint and there was not a significant fluid accumulation although we did take cultures of the initial joint fluid that we encountered.  We then completed a circumferential dissection around the humeral metaphysis to allow dislocation of the joint.  We then used a rondure to remove soft tissue at the bone implant margins and this was sent for tissue culture.  The implant on the humeral side was then disassembled and a slap pamper was placed and the implant readily  came out suggesting  some underlying looseness.  We then took samples of soft tissue from beneath the humeral implant metaphysis was labeled as deep proximal humeral soft tissues and sent for culture.  Once the implant was removed we then took samples of the humeral canal to be sent for cultures as well.  The canal was then curetted and reamed removing the soft tissue interface that it developed around the implant.  A trial broach was then placed into the humeral canal to protect the canal bone we then exposed the glenosphere.  Glenosphere was removed using our standard process and samples of fluid beneath the glenosphere were then sent for culture the baseplate was then removed and we took samples of fluid and tissue from beneath the glenoid baseplate and there was noted to be significant erosions about the glenoid baseplate and I should mention also there is significant erosions of the bone resorption of the bone around the proximal humeral metaphysis well all suggestive of some low-grade infection.  We then performed a thorough debridement removing all devitalized tissues with blunt and sharp dissection.  Pulsatile lavage was then used to thoroughly debride and irrigate the glenoid as well as humeral shaft and all of the periarticular soft tissues.  At this point we selected the size small [employer] spacer and mixed cement was impregnated with both gentamicin and vancomycin and at the appropriate consistency the small [employer] stem was then wrapped with the bone cement cement was then delivered into the humeral canal and the stem was placed to the appropriate level at approximate 30 degrees retroversion and the bone was then formed to properly fit the contours of the implant and the proximal humeral bone and then allowed to harden.  Once the cement had appropriately hardened and all extra cement was carefully removed the joint was reduced showing good overall position soft tissue balance and alignment.  The wound was then copes  irrigated.  Vancomycin powder spread liberally throughout the soft tissue planes.  The deltopectoral interval was reapproximated with a series of figure-of-eight #1 PDS sutures.  2-0 Monocryl used to close the subcu layer and intracuticular 3-0 Monocryl for the skin followed by Dermabond and Aquacel dressing.  Right arm was then placed into a sling and the patient was awakened, extubated, and taken to the recovery room in stable condition. ? ?Jenetta Loges, PA-C was utilized as an Environmental consultant throughout this case, essential for help with positioning the patient, positioning extremity, tissue manipulation, implantation of the prosthesis, suture management, wound closure, and intraoperative decision-making. ? ?Marin Shutter MD ? ? ?Contact # [phone] ? ? ? ? ?

## 2021-07-12 NOTE — Progress Notes (Signed)
AssistedDr. Criss Rosales with right, interscalene , ultrasound guided block. Side rails up, monitors on throughout procedure. See vital signs in flow sheet. Tolerated Procedure well. ? ?

## 2021-07-13 ENCOUNTER — Encounter (HOSPITAL_COMMUNITY): Payer: Self-pay | Admitting: Orthopedic Surgery

## 2021-07-13 ENCOUNTER — Other Ambulatory Visit (HOSPITAL_COMMUNITY): Payer: Self-pay

## 2021-07-13 DIAGNOSIS — Z4731 Aftercare following explantation of shoulder joint prosthesis: Secondary | ICD-10-CM

## 2021-07-13 LAB — CK: Total CK: 262 U/L — ABNORMAL HIGH (ref 38–234)

## 2021-07-13 LAB — CREATININE, SERUM
Creatinine, Ser: 0.85 mg/dL (ref 0.44–1.00)
GFR, Estimated: >60 mL/min (ref >60)

## 2021-07-13 LAB — C-REACTIVE PROTEIN: CRP: 6.5 mg/dL — ABNORMAL HIGH (ref <1.0)

## 2021-07-13 LAB — SEDIMENTATION RATE: Sed Rate: 14 mm/hr (ref 0–22)

## 2021-07-13 MED ORDER — SODIUM CHLORIDE 0.9% FLUSH
10.0000 mL | INTRAVENOUS | Status: DC | PRN
  Administered 2021-07-14: 10 mL

## 2021-07-13 MED ORDER — OXYCODONE-ACETAMINOPHEN 5-325 MG PO TABS: 1.0000 | ORAL_TABLET | ORAL | 0 refills | Status: DC | PRN

## 2021-07-13 MED ORDER — CHLORHEXIDINE GLUCONATE CLOTH 2 % EX PADS
6.0000 | MEDICATED_PAD | Freq: Every day | CUTANEOUS | Status: DC
  Administered 2021-07-13 – 2021-07-14 (×2): 6 via TOPICAL

## 2021-07-13 MED ORDER — CEFTRIAXONE IV (FOR PTA / DISCHARGE USE ONLY): 2.0000 g | INTRAVENOUS | 0 refills | Status: DC

## 2021-07-13 MED ORDER — DAPTOMYCIN IV (FOR PTA / DISCHARGE USE ONLY): 550.0000 mg | INTRAVENOUS | 0 refills | Status: DC

## 2021-07-13 MED ORDER — ONDANSETRON HCL 4 MG PO TABS
4.0000 mg | ORAL_TABLET | Freq: Three times a day (TID) | ORAL | 0 refills | Status: DC | PRN
  Filled 2021-07-13: qty 15, 5d supply, fill #0

## 2021-07-13 MED ORDER — DAPTOMYCIN 500 MG IV SOLR
8.0000 mg/kg | Freq: Every day | INTRAVENOUS | Status: DC
  Administered 2021-07-13: 550 mg via INTRAVENOUS
  Filled 2021-07-13: qty 11

## 2021-07-13 NOTE — Plan of Care (Signed)
?  Problem: Education: ?Goal: Knowledge of the prescribed therapeutic regimen will improve ?07/13/2021 2346 by Abagail Kitchens, RN ?Outcome: Progressing ?07/13/2021 2346 by Abagail Kitchens, RN ?Outcome: Progressing ?07/13/2021 2345 by Abagail Kitchens, RN ?Outcome: Progressing ?  ?Problem: Pain Management: ?Goal: Pain level will decrease with appropriate interventions ?07/13/2021 2346 by Abagail Kitchens, RN ?Outcome: Progressing ?07/13/2021 2345 by Abagail Kitchens, RN ?Outcome: Progressing ?  ?Problem: Activity: ?Goal: Risk for activity intolerance will decrease ?Outcome: Progressing ?  ?Problem: Pain Managment: ?Goal: General experience of comfort will improve ?Outcome: Progressing ?  ?

## 2021-07-13 NOTE — Progress Notes (Signed)
Natalie [name]  ?MRN: [id number] ?DOB/Age: [birth year] 76 y.o. ?[place] ?Procedure: Procedure(s) (LRB): ?Removal Right Reverse shoulder arthroplasty and placement of antibiotic spacer (Right) ? ? ? ? ?Subjective: ?Did not get much rest last night. Sore but currently pain controlled. She refused the PICC line yesterday because she was concerned about her insurance coverage.  ? ?Vital Signs ?Temp:  [97.5 ?F (36.4 ?C)-98.1 ?F (36.7 ?C)] 98.1 ?F (36.7 ?C) (03/24 0509) ?Pulse Rate:  [64-85] 73 (03/24 0509) ?Resp:  [12-26] 17 (03/24 0509) ?BP: (98-188)/(53-87) 98/62 (03/24 0509) ?SpO2:  [95 %-100 %] 97 % (03/24 0509) ?Weight:  [67.5 kg] 67.5 kg (03/23 1017) ? ?Lab Results ?No results for input(s): WBC, HGB, HCT, PLT in the last 72 hours. ?BMET ?Recent Labs  ?  07/13/21 ?0300  ?CREATININE 0.85  ? ?No results found for: INR ? ? ?All cultures negative on initial gram stain and pending ? ? ? ?Exam ?RUE dressing dry, moderate swelling and bruising ? ? ? ? ? ? ? ?Plan ?We discussed our plan for extended antibiotics and need for PICC line acces to administer and she understands and is on board. ? ?Will have TOC team initiate plans for home abx and plan DC once all arrangements have been made ? ?Appreciate ID care with Mrs. Manansala ? ?Jenetta Loges PA-C  ?07/13/2021, 7:49 AM ?Contact # [phone] ? ?  Did  ?

## 2021-07-13 NOTE — TOC Initial Note (Signed)
Transition of Care (TOC) - Initial/Assessment Note  ? ? ?Patient Details  ?Name: Natalie [name] ?MRN: [id number] ?Date of Birth: [birth year] ? ?Transition of Care (TOC) CM/SW Contact:    ?Samuella Rasool, LCSW ?Phone Number: ?07/13/2021, 2:00 PM ? ?Clinical Narrative:                 ?Met with pt and spouse this morning to discuss dc planning needs.  Both very pleasant and they are fully aware of plan for pt to dc home with IV abx needed for several weeks.  We discussed need for arrangement of these abx as well as HHRN. They are agreeable with TOC setting this up and have no agency preference.  Have placed referral for [place] abx coverage with [employer] [facility]ayers, RN) and [facility] coverage with [facility].  Spouse understands that teaching will be completed with him on IV abx delivery plan prior to pt's dc home - he is fully agreeable with this.  At this time, we are still awaiting PICC placement as well as final abx orders.  Will alert oncoming TOC of plans in place already. ? ?Expected Discharge Plan: [place] ?Barriers to Discharge: Continued Medical Work up ? ? ?Patient Goals and CMS Choice ?Patient states their goals for this hospitalization and ongoing recovery are:: return home ?  ?  ? ?Expected Discharge Plan and Services ?Expected Discharge Plan: [place] ?In-house Referral: Clinical Social Work ?  ?Post Acute Care Choice: Home Health ?Living arrangements for the past 2 months: [place] ?                ?  ?  ?  ?  ?  ?HH Arranged: RN, IV Antibiotics ?[place] Agency: [facility], [employer] ?Date HH Agency Contacted: 07/13/21 ?  ?Representative spoke with at [place]: [facility]; [employer] [facility]ayers ? ?Prior Living Arrangements/Services ?Living arrangements for the past 2 months: [place] ?Lives with:: Spouse ?Patient language and need for interpreter reviewed:: Yes ?Do you feel safe going back to the place where you live?: Yes      ?Need for  Family Participation in Patient Care: Yes (Comment) ?Care giver support system in place?: Yes (comment) ?  ?Criminal Activity/Legal Involvement Pertinent to Current Situation/Hospitalization: No - Comment as needed ? ?Activities of Daily Living ?Home Assistive Devices/Equipment: Eyeglasses ?ADL Screening (condition at time of admission) ?Patient's cognitive ability adequate to safely complete daily activities?: Yes ?Is the patient deaf or have difficulty hearing?: No ?Does the patient have difficulty seeing, even when wearing glasses/contacts?: No ?Does the patient have difficulty concentrating, remembering, or making decisions?: No ?Patient able to express need for assistance with ADLs?: Yes ?Does the patient have difficulty dressing or bathing?: No ?Independently performs ADLs?: Yes (appropriate for developmental age) ?Does the patient have difficulty walking or climbing stairs?: No ?Weakness of Legs: None ?Weakness of Arms/Hands: Right ? ?Permission Sought/Granted ?Permission sought to share information with : Family Supports ?Permission granted to share information with : Yes, Verbal Permission Granted ? Share Information with NAME: Natalie [name] ?   ? Permission granted to share info w Relationship: spouse ? Permission granted to share info w Contact Information: [phone] ? ?Emotional Assessment ?Appearance:: Appears stated age ?Attitude/Demeanor/Rapport: Gracious ?Affect (typically observed): Accepting, Pleasant ?Orientation: : Oriented to Self, Oriented to Place, Oriented to  Time, Oriented to Situation ?Alcohol / Substance Use: Not Applicable ?Psych Involvement: No (comment) ? ?Admission diagnosis:  Aftercare following explantation of shoulder joint prosthesis [Z47.31] ?Patient Active Problem List  ? Diagnosis Date Noted  ? Aftercare following explantation of shoulder joint prosthesis 07/12/2021  ? History of total abdominal hysterectomy and bilateral salpingo-oophorectomy 02/22/2020  ? Upper airway cough  syndrome 02/12/2017  ? Breast mass, right 08/07/2011  ? Osteopenia   ? Hyperlipidemia   ? Arthritis   ? Heart murmur   ? Hypertension   ? ?PCP:  Prince Solian, MD ?Pharmacy:   ?[facility] [address] ?[place] ?[place] [address] ?Phone: [phone] Fax: [phone] ? ?[place] [place] ?[place] ?[place] [address] ?Phone: [phone] Fax: [phone] ? ?[facility] ?[address] ?[place] [address] ?Phone: [phone] Fax: [phone] ? ? ? ? ?Social Determinants of Health (SDOH) Interventions ?  ? ?Readmission Risk Interventions ? ?  07/13/2021  ?  1:58 PM  ?Readmission Risk Prevention Plan  ?Post Dischage Appt Complete  ?Medication Screening Complete  ?Transportation Screening Complete  ? ? ? ?

## 2021-07-13 NOTE — Progress Notes (Signed)
PHARMACY CONSULT NOTE FOR: ? ?OUTPATIENT  PARENTERAL ANTIBIOTIC THERAPY (OPAT) ? ?Indication: prosthetic joint infection ?Regimen: Ceftriaxone 2g IV q24h, Daptomycin '550mg'$  IV q24h ?End date: 08/24/2021 ? ?IV antibiotic discharge orders are pended. ?To discharging provider:  please sign these orders via discharge navigator,  ?Select New Orders & click on the button choice - Manage This Unsigned Work.  ?  ? ?Thank you for allowing pharmacy to be a part of this patient's care. ? ?Luiz Ochoa ?07/13/2021, 3:48 PM ? ?

## 2021-07-13 NOTE — Consult Note (Addendum)
[facility]  Total days of antibiotics 2/ vanco and ceftriaxone Reason for Consult: possible periprosthetic shoulder infection    Referring Physician: supple  Principal Problem:   Aftercare following explantation of shoulder joint prosthesis    HPI: Natalie [name] is a 76 y.o. female with history of HTN, HLD, who had right shoulder rotator cuff tear arthropathy and underwent right shoulder reverse arthroplasty in may 2022. Post-operatively did physical therapy but hen started to have worsening right shoulder pain. Dr Rennis Chris did work up to rule out loosening of HW vs. Infection but all have been inconclusive. Due to pain/decrease QoL/function, she was admitted for presumed periprosthetic infection to explant her prosthesis and have placement on antibiotic impregnated spacer for 2staged revision.  She hopes to have quick recovery since her 2 grandkids are getting married in [place] and oct.  She slept poorly last night, had paradoxical effect to benadryl, -caused agitation. She is also concerned about pain once the nerve block wears off.  No fever, chills, nightsweats, diarrhea  She was started on vancomycin and ceftriaxone  Husband at bedside will be doing abtx infusion. 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    Allergies:  Allergies  Allergen Reactions   Cephalexin     Can not recall reaction   Nitrofurantoin Monohyd Macro     Call not recall reaction   Sulfa Antibiotics Nausea Only    MEDICATIONS:  aspirin EC  81 mg Oral Daily   atenolol  50  mg Oral Daily   And   chlorthalidone  12.5 mg Oral Daily   docusate sodium  100 mg Oral BID   losartan  50 mg Oral Daily   meloxicam  15 mg Oral QHS   pantoprazole  40 mg Oral Daily   pramipexole  0.5 mg Oral QHS    Social History   Tobacco Use   Smoking status: Never   Smokeless tobacco: Never  Vaping Use   Vaping Use: Never used  Substance Use Topics   Alcohol use: No   Drug use: No    Family History  Problem Relation Age of Onset   Heart disease Mother    Hypertension Mother    Heart disease Father    Hypertension Father    Diabetes Daughter    Heart Problems Daughter     Review of Systems  Constitutional: Negative for fever, chills, diaphoresis, activity change, appetite change, fatigue and unexpected weight change.  HENT: Negative for congestion, sore throat, rhinorrhea, sneezing, trouble swallowing and sinus pressure.  Eyes: Negative for photophobia and visual disturbance.  Respiratory: Negative for cough, chest tightness, shortness of breath, wheezing and stridor.  Cardiovascular: Negative for chest pain, palpitations and leg swelling.  Gastrointestinal: Negative for nausea, vomiting, abdominal pain, diarrhea, constipation, blood in stool, abdominal distention and anal bleeding.  Genitourinary: Negative for dysuria, hematuria, flank pain and difficulty urinating.  Musculoskeletal: Negative for  myalgias, back pain, joint swelling, arthralgias and gait problem.  Skin: Negative for color change, pallor, rash and wound.  Neurological: Negative for dizziness, tremors, weakness and light-headedness.  Hematological: Negative for adenopathy. Does not bruise/bleed easily.  Psychiatric/Behavioral: Negative for behavioral problems, confusion, sleep disturbance, dysphoric mood, decreased concentration and agitation.    OBJECTIVE: Temp:  [97.7 F (36.5 C)-98.1 F (36.7 C)] 98.1 F (36.7 C) (03/24 0509) Pulse Rate:  [64-85] 73 (03/24 0509) Resp:  [15-20] 17 (03/24  0509) BP: (98-188)/(53-87) 98/62 (03/24 0509) SpO2:  [95 %-100 %] 97 % (03/24 0509) Physical Exam  Constitutional:  oriented to person, place, and time. appears well-developed and well-nourished. No distress.  HENT: [place]/AT, PERRLA, no scleral icterus Mouth/Throat: Oropharynx is clear and moist. No oropharyngeal exudate.  Cardiovascular: Normal rate, regular rhythm and normal heart sounds. Exam reveals no gallop and no friction rub.  No murmur heard.  Pulmonary/Chest: Effort normal and breath sounds normal. No respiratory distress.  has no wheezes.  Neck = supple, no nuchal rigidity ZOX:[id number] shoulder in sling. Bandage in place. No spotting or bruising visible  Abdominal: Soft. Bowel sounds are normal.  exhibits no distension. There is no tenderness.  Lymphadenopathy: no cervical adenopathy. No axillary adenopathy Neurological: alert and oriented to person, place, and time.  Skin: Skin is warm and dry. No rash noted. No erythema.  Psychiatric: a normal mood and affect.  behavior is normal.    LABS: Results for orders placed or performed during the hospital encounter of 07/12/21 (from the past 48 hour(s))  Aerobic/Anaerobic Culture w Gram Stain (surgical/deep wound)     Status: None (Preliminary result)   Collection Time: 07/12/21  1:06 PM   Specimen: PATH Other; Tissue  Result Value Ref Range   Specimen Description      TISSUE Performed at [facility], [address] [place], [place] [address]    Special Requests      NONE RIGHT SHOULDER JOINT FLD Performed at [facility], [address], [place], [place] [address]    Gram Stain      NO SQUAMOUS EPITHELIAL CELLS SEEN RARE WBC SEEN NO ORGANISMS SEEN    Culture      NO GROWTH < 24 HOURS Performed at [facility], [address], [place], [place] [address]    Report Status PENDING   Aerobic/Anaerobic Culture w Gram Stain (surgical/deep wound)     Status: None (Preliminary result)    Collection Time: 07/12/21  1:09 PM   Specimen: PATH Other; Tissue  Result Value Ref Range   Specimen Description      TISSUE Performed at [facility], [address], [place], [place] [address]    Special Requests      NONE PERIPROSTHETIC RIGHT SHOULDER Performed at [facility], [address] [place], [place] [address]    Gram Stain      NO SQUAMOUS EPITHELIAL CELLS SEEN FEW WBC SEEN NO ORGANISMS SEEN    Culture      NO GROWTH < 12 HOURS Performed at [facility], [address], [place], [place] [address]    Report Status PENDING   Aerobic/Anaerobic Culture w Gram Stain (surgical/deep wound)     Status: None (Preliminary result)   Collection Time: 07/12/21  1:17 PM   Specimen: PATH Other; Tissue  Result Value Ref Range   Specimen Description      TISSUE Performed at [facility], [address] [place], [place] [address]    Special Requests      NONE DEEP PERIPROSTHETIC RIGHT SHOULDER Performed at [facility], [address] [place], [place] [address]    Gram Stain      NO SQUAMOUS EPITHELIAL CELLS SEEN FEW WBC SEEN NO ORGANISMS SEEN    Culture      NO GROWTH < 12 HOURS Performed at [facility], [address], [place], [place] [address]    Report Status PENDING   Aerobic/Anaerobic Culture w Gram Stain (surgical/deep wound)     Status: None (Preliminary result)   Collection Time: 07/12/21  1:22 PM   Specimen: PATH Other; Tissue  Result Value Ref Range   Specimen Description      TISSUE Performed at [facility], [address] [place], [place] [address]    Special Requests      NONE RIGHT HUMERAL CANAL Performed at [facility], [address] [place], [place] [address]    Gram Stain      NO SQUAMOUS EPITHELIAL CELLS SEEN FEW WBC SEEN NO ORGANISMS SEEN    Culture      NO GROWTH < 24 HOURS Performed at [facility], [address], [place], [place] [address]    Report Status PENDING   Aerobic/Anaerobic Culture w Gram Stain (surgical/deep wound)     Status: None (Preliminary result)   Collection Time: 07/12/21  1:34 PM   Specimen: PATH Other; Tissue  Result Value Ref Range   Specimen Description      TISSUE Performed at [facility], [address], [place], [place] [address]    Special Requests      NONE RIGHT GLENOID Performed at [facility], [address] [place], [place] [address]    Gram Stain      NO SQUAMOUS EPITHELIAL CELLS SEEN RARE WBC SEEN NO ORGANISMS SEEN    Culture      NO GROWTH < 12 HOURS Performed at [facility], [address], [place], [place] [address]    Report Status PENDING   Aerobic/Anaerobic Culture w Gram Stain (surgical/deep wound)     Status: None (Preliminary result)   Collection Time: 07/12/21  1:34 PM   Specimen: PATH Other; Tissue  Result Value Ref Range   Specimen Description      TISSUE Performed at [facility], [address], [place], [place] [address]    Special Requests      NONE RIGHT GLENOID CULTURE Performed at [facility], [address], [place], [place] [address]    Gram Stain      NO SQUAMOUS EPITHELIAL CELLS SEEN FEW WBC SEEN NO ORGANISMS SEEN    Culture      NO GROWTH < 24 HOURS Performed at [facility], [address] [place], [place] [address]    Report Status PENDING   Creatinine, serum     Status: None   Collection Time: 07/13/21  3:00 AM  Result Value Ref Range   Creatinine, Ser 0.85 0.44 - 1.00 mg/dL   GFR, Estimated >63 >87 mL/min    Comment: (NOTE) Calculated using the CKD-EPI Creatinine Equation (2021) Performed at [facility], [address] [place], [place] [address]     MICRO: pending IMAGING: [place] EKG SITE RITE  Result Date: 07/12/2021 If [facility] image not attached, placement could not be  confirmed due to current  cardiac rhythm.    Assessment/Plan:  76yo F with presumed right shoulder periprosthetic infection, POD#1 since explant and new abtx spacer. - plan for 6 wk of ceftriaxone 2gm IV daily plus daptomycin at 8mg /kg/day as culture negative regimen. We will follow up on cultures to narrow as outpatient since some c. Acnes can take up to 10 days to isolate - plan for picc line today - will check sed rate and crp - will get home health to start education with family - will plan for her to follow up in ID clinic in 2-4 wk and 4-6 wk  Spent 110 min with patient, with greater than 50% in discussing of care plan and coordination of care ---- Diagnosis: Prosthetic joint infection  Culture Result: culture negative  Allergies  Allergen Reactions   Cephalexin     Can not recall reaction   Nitrofurantoin Monohyd Macro     Call not recall reaction   Sulfa Antibiotics Nausea Only    OPAT Orders Discharge antibiotics to be given via PICC line Discharge antibiotics: Per pharmacy protocol daptomycin 8mg /kg/day and ceftriaxone 2gm iv daily  Duration: 6 wk End Date: May 5th  [facility] Care Per Protocol:  Home health RN for IV administration and teaching; PICC line care and labs.    Labs weekly while on IV antibiotics: _x_ CBC with differential _x_ BMP  _x_ CRP x__ ESR  _x_ CK  __x Please pull PIC at completion of IV antibiotics   Fax weekly labs to [phone]  Clinic Follow Up Appt: 4-6 wk   @ [facility]

## 2021-07-13 NOTE — Plan of Care (Signed)
?  Problem: Activity: ?Goal: Ability to tolerate increased activity will improve ?Outcome: Progressing ?  ?Problem: Pain Management: ?Goal: Pain level will decrease with appropriate interventions ?Outcome: Progressing ?  ?Problem: Clinical Measurements: ?Goal: Ability to maintain clinical measurements within normal limits will improve ?Outcome: Progressing ?  ?

## 2021-07-13 NOTE — Progress Notes (Signed)
Peripherally Inserted Central Catheter Placement ? ?The IV Nurse has discussed with the patient and/or persons authorized to consent for the patient, the purpose of this procedure and the potential benefits and risks involved with this procedure.  The benefits include less needle sticks, lab draws from the catheter, and the patient may be discharged home with the catheter. Risks include, but not limited to, infection, bleeding, blood clot (thrombus formation), and puncture of an artery; nerve damage and irregular heartbeat and possibility to perform a PICC exchange if needed/ordered by physician.  Alternatives to this procedure were also discussed.  Bard Power PICC patient education guide, fact sheet on infection prevention and patient information card has been provided to patient /or left at bedside.   ? ?PICC Placement Documentation  ?PICC Single Lumen 33/82/50 Left Basilic 40 cm 0 cm (Active)  ?Indication for Insertion or Continuance of Line Home intravenous therapies (PICC only) 07/13/21 1651  ?Exposed Catheter (cm) 0 cm 07/13/21 1651  ?Site Assessment Clean;Dry;Intact 07/13/21 1651  ?Line Status Flushed;Saline locked;Blood return noted 07/13/21 1651  ?Dressing Type Securing device;Transparent 07/13/21 1651  ?Dressing Status Antimicrobial disc in place 07/13/21 1651  ?Safety Lock Not Applicable 53/97/67 3419  ?Line Care Connections checked and tightened 07/13/21 1651  ?Line Adjustment (NICU/IV Team Only) No 07/13/21 1651  ?Dressing Intervention New dressing 07/13/21 1651  ?Dressing Change Due 07/20/21 07/13/21 1651  ? ? ? ? ? ?Breona Cherubin, Nicolette Bang ?07/13/2021, 4:52 PM ? ?

## 2021-07-13 NOTE — Discharge Instructions (Signed)

## 2021-07-14 ENCOUNTER — Other Ambulatory Visit (HOSPITAL_COMMUNITY): Payer: Self-pay

## 2021-07-14 MED ORDER — SODIUM CHLORIDE 0.9 % IV SOLN
8.0000 mg/kg | Freq: Once | INTRAVENOUS | Status: AC
  Administered 2021-07-14: 550 mg via INTRAVENOUS
  Filled 2021-07-14 (×2): qty 11

## 2021-07-14 MED ORDER — METHOCARBAMOL 500 MG PO TABS: 500.0000 mg | ORAL_TABLET | Freq: Four times a day (QID) | ORAL | 0 refills | Status: DC

## 2021-07-14 MED ORDER — DAPTOMYCIN 500 MG IV SOLR
8.0000 mg/kg | Freq: Every day | INTRAVENOUS | Status: DC
Start: 2021-07-15 — End: 2021-07-14

## 2021-07-14 MED ORDER — HEPARIN SOD (PORK) LOCK FLUSH 100 UNIT/ML IV SOLN
250.0000 [IU] | INTRAVENOUS | Status: AC | PRN
  Administered 2021-07-14: 250 [IU]
  Filled 2021-07-14: qty 2.5

## 2021-07-14 MED ORDER — ONDANSETRON HCL 4 MG PO TABS: 4.0000 mg | ORAL_TABLET | Freq: Three times a day (TID) | ORAL | 0 refills | Status: DC | PRN

## 2021-07-14 MED ORDER — SODIUM CHLORIDE 0.9 % IV SOLN: INTRAVENOUS | Status: DC | PRN

## 2021-07-14 NOTE — Progress Notes (Signed)
The patient is alert and oriented and has been seen by her physician. The orders for discharge were written. PICC line was flushed and capped by IV team. Martin Majestic over discharge instructions with patient and family. She is being discharged via wheelchair with all of her belongings.   ?

## 2021-07-14 NOTE — TOC Transition Note (Signed)
Transition of Care (TOC) - CM/SW Discharge Note ? ? ?Patient Details  ?Name: Natalie [name] ?MRN: [id number] ?Date of Birth: Dec 25, 1945 ? ?Transition of Care (TOC) CM/SW Contact:  ?Ross Ludwig, LCSW ?Phone Number: ?07/14/2021, 1:25 PM ? ? ?Clinical Narrative:    ? ?Patient will be going home with home health through [facility] IV antibiotics through [facility].  CSW signing off please reconsult with any other social work needs, home health agency has been notified of planned discharge. ? ? ?Final next level of care: [place] ?Barriers to Discharge: Barriers Resolved ? ? ?Patient Goals and CMS Choice ?Patient states their goals for this hospitalization and ongoing recovery are:: To return back home with home health. ?CMS Medicare.gov Compare Post Acute Care list provided to:: Patient ?Choice offered to / list presented to : Patient ? ?Discharge Placement ?  ?           ?  ?  ?  ?  ? ?Discharge Plan and Services ?In-house Referral: Clinical Social Work ?  ?Post Acute Care Choice: Home Health          ?DME Arranged: IV pump/equipment ?DME Agency: Other - Comment ?Date DME Agency Contacted: 07/13/21 ?Time DME Agency Contacted: [phone] ?Representative spoke with at DME Agency: Pam ?[place]: RN ?[place]: [place] ?Date HH Agency Contacted: 07/14/21 ?Time [place]: [phone] ?Representative spoke with at [place]: [place] ? ?Social Determinants of Health (SDOH) Interventions ?  ? ? ?Readmission Risk Interventions ? ?  07/13/2021  ?  1:58 PM  ?Readmission Risk Prevention Plan  ?Post Dischage Appt Complete  ?Medication Screening Complete  ?Transportation Screening Complete  ? ? ? ? ? ?

## 2021-07-14 NOTE — Progress Notes (Signed)
? ?  Subjective: ? ?Natalie [name] is a 76 y.o. female, 2 Days Post-Op  ?  s/p Procedure(s): ?Removal Right Reverse shoulder arthroplasty and placement of antibiotic spacer ? ? ?Patient reports pain as mild to moderate.  Overall doing well, sore but pain controlled.  Patient had PICC line placed and began treatments.  There was instruction to her husband on how to administer medication through PICC line.  Denies numbness or tingling.  Denies fever or chills.  She has passed gas and urinated.  Denies nausea and vomiting. ? ?Objective:  ? ?VITALS:   ?Vitals:  ? 07/13/21 0509 07/13/21 1250 07/13/21 2020 07/14/21 0416  ?BP: 98/62 (!) 127/53 (!) 145/80 (!) 145/64  ?Pulse: 73 75 90 78  ?Resp: '17 18 18 16  '$ ?Temp: 98.1 ?F (36.7 ?C) 97.7 ?F (36.5 ?C) 98.1 ?F (36.7 ?C) 98.8 ?F (37.1 ?C)  ?TempSrc:  Oral Oral Oral  ?SpO2: 97% 94% 100% 96%  ?Weight:      ?Height:      ?No acute distress ? ?Neurologically intact ?Neurovascular intact ?Sensation intact distally ?Intact pulses distally ?Incision: dressing C/D/I ?Compartment soft ?Intact Aquacel to the right shoulder, able to move elbow wrist and digits.  Sensation intact distally.  PICC line present on left upper extremity ? ?Lab Results  ?Component Value Date  ? WBC 7.3 06/20/2021  ? HGB 12.2 06/20/2021  ? HCT 37.2 06/20/2021  ? MCV 92.5 06/20/2021  ? PLT 252 06/20/2021  ? ?BMET ?   ?Component Value Date/Time  ? NA 134 (L) 06/20/2021 1042  ? K 3.9 06/20/2021 1042  ? CL 99 06/20/2021 1042  ? CO2 26 06/20/2021 1042  ? GLUCOSE 82 06/20/2021 1042  ? BUN 21 06/20/2021 1042  ? CREATININE 0.85 07/13/2021 0300  ? CALCIUM 9.6 06/20/2021 1042  ? GFRNONAA >60 07/13/2021 0300  ? ? ? ?Assessment/Plan: ?2 Days Post-Op  ? ?Principal Problem: ?  Aftercare following explantation of shoulder joint prosthesis ? ? ?Advance diet ?Up with therapy ?Discharge today after afternoon antibiotics ?Home antibiotics for PICC line to be delivered tonight and home health nurse appointment  tomorrow ? ?Weightbearing Status: Nonweightbearing right upper extremity ? ? ? ? ?Natalie [name] ?07/14/2021, 8:57 AM ? ?Natalie Sidle PA-C  ?Physician Assistant with Dr. Lillia Abed [place] ? ?

## 2021-07-14 NOTE — Plan of Care (Signed)
  Problem: Education: Goal: Knowledge of General Education information will improve Description Including pain rating scale, medication(s)/side effects and non-pharmacologic comfort measures Outcome: Progressing   Problem: Health Behavior/Discharge Planning: Goal: Ability to manage health-related needs will improve Outcome: Progressing   

## 2021-07-15 DIAGNOSIS — M009 Pyogenic arthritis, unspecified: Secondary | ICD-10-CM | POA: Diagnosis not present

## 2021-07-15 NOTE — Progress Notes (Signed)
Result Notes ?   ?Component 3 d ago  ?Specimen Description TISSUE  ?Performed at [facility], [place] [address], [place], [place] [address]   ?Special Requests NONE RIGHT SHOULDER JOINT FLD  ?Performed at [facility], [place] [address] [place], [place] [address]   ?Gram Stain NO SQUAMOUS EPITHELIAL CELLS SEEN  ?RARE WBC SEEN  ?NO ORGANISMS SEEN   ?Culture NO GROWTH 3 DAYS CONTINUING TO HOLD  ?Performed at [facility], [place] [address] [place], [place] [address]   ?Report Status PENDING   ?  ? ? ?Rosiland Oz, MD ?Infectious Disease Physician ?[facility] ?[address] ?[place], [place] [address] ?Phone: [phone]  Fax: [phone] ? ?

## 2021-07-16 NOTE — Discharge Summary (Signed)
Patient ID: ?Natalie [name] ?MRN: [id number] ?DOB/AGE: [birth year] 77 y.o. ? ?Admit date: 07/12/2021 ?Discharge date: 07/14/2021 ? 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 Arthritis   ? Back pain   ? Cancer [facility])   ? Change in hearing   ? Generalized headaches   ? GERD (gastroesophageal reflux disease)   ? Heart murmur   ? Pt denies  ? Hyperlipidemia   ? Hypertension   ? Joint pain   ? Leg pain   ? Osteopenia 04/2015  ? T score -1.9 FRAX 12%/2.6% stable from prior DEXA  ? PONV (postoperative nausea and vomiting)   ? Skin cancer   ? Varicose veins of leg with pain Left  > Right  ? ?Discharge Diagnoses:   ?Principal Problem: ?  Aftercare following explantation of shoulder joint prosthesis ? ?Estimated body mass index is 27.22 kg/m? as calculated from the following: ?  Height as of this encounter: '5\' 2"'$  (1.575 m). ?  Weight as of this encounter: 67.5 kg. ? ?Procedure:  ?Procedure(s) (LRB): ?Removal Right Reverse shoulder arthroplasty and placement of antibiotic spacer (Right)  ? ?Consults: ID ? ?HPI: Natalie [name] is a 76 year old female who underwent right reverse total shoulder arthroplasty in May 2022 postoperative continue to have significant right shoulder pain without easily identifiable source.  She under went significant work-ups for further investigation of this which came back negative.  Patient continues to have significant shoulder pain and concern was for deep-seated infection.  She was brought to the operating room for planned explantation of prosthesis and placement of antibiotic-impregnated's pacer.  She had cultures obtained, was started on empiric IV antibiotics for 6 weeks, and was admitted for postoperative monitoring and treatments. ? ?Laboratory Data: ?Admission on 07/12/2021, Discharged on 07/14/2021  ?Component Date  Value Ref Range Status  ? Specimen Description 07/12/2021    Final  ?                 Value:TISSUE ?Performed at [facility], [place] [address], [place], [place] [address] ?  ? Special Requests 07/12/2021    Final  ?                 Value:NONE RIGHT SHOULDER JOINT FLD ?Performed at [facility], [place] [address] [place], [place] [address] ?  ? Gram Stain 07/12/2021    Final  ?                 Value:NO SQUAMOUS EPITHELIAL CELLS SEEN ?RARE WBC SEEN ?NO ORGANISMS SEEN ?  ? Culture 07/12/2021    Final  ?                 Value:NO GROWTH 4 DAYS CONTINUING TO HOLD ?Performed at [facility], [place] [address] [place], [place] [address] ?  ? Report Status 07/12/2021 PENDING   Incomplete  ? Specimen Description 07/12/2021    Final  ?                 Value:TISSUE ?Performed at [facility], [place] [address] [place], [place] [address] ?  ? Special Requests 07/12/2021    Final  ?                 Value:NONE PERIPROSTHETIC RIGHT SHOULDER ?Performed at [facility], [place] [address], [place], [place] [address] ?  ?  Gram Stain 07/12/2021    Final  ?                 Value:NO SQUAMOUS EPITHELIAL CELLS SEEN ?FEW WBC SEEN ?NO ORGANISMS SEEN ?  ? Culture 07/12/2021    Final  ?                 Value:NO GROWTH 4 DAYS CONTINUING TO HOLD ?Performed at [facility], [place] [address], [place], [place] [address] ?  ? Report Status 07/12/2021 PENDING   Incomplete  ? Specimen Description 07/12/2021    Final  ?                 Value:TISSUE ?Performed at [facility], [place] [address], [place], [place] [address] ?  ? Special Requests 07/12/2021    Final  ?                 Value:NONE DEEP PERIPROSTHETIC RIGHT SHOULDER ?Performed at [facility], [place] [address] [place], [place] [address] ?  ? Gram Stain 07/12/2021    Final  ?                 Value:NO SQUAMOUS EPITHELIAL CELLS SEEN ?FEW WBC SEEN ?NO ORGANISMS SEEN ?  ? Culture 07/12/2021     Final  ?                 Value:NO GROWTH 4 DAYS CONTINUING TO HOLD ?Performed at [facility], [place] [address], [place], [place] [address] ?  ? Report Status 07/12/2021 PENDING   Incomplete  ? Specimen Description 07/12/2021    Final  ?                 Value:TISSUE ?Performed at [facility], [place] [address] [place], [place] [address] ?  ? Special Requests 07/12/2021    Final  ?                 Value:NONE RIGHT HUMERAL CANAL ?Performed at [facility], [place] [address], [place], [place] [address] ?  ? Gram Stain 07/12/2021    Final  ?                 Value:NO SQUAMOUS EPITHELIAL CELLS SEEN ?FEW WBC SEEN ?NO ORGANISMS SEEN ?  ? Culture 07/12/2021    Final  ?                 Value:NO GROWTH 4 DAYS CONTINUING TO HOLD ?Performed at [facility], [place] [address] [place], [place] [address] ?  ? Report Status 07/12/2021 PENDING   Incomplete  ? Specimen Description 07/12/2021    Final  ?                 Value:TISSUE ?Performed at [facility], [place] [address], [place], [place] [address] ?  ? Special Requests 07/12/2021    Final  ?                 Value:NONE RIGHT GLENOID ?Performed at [facility], [place] [address], [place], [place] [address] ?  ? Gram Stain 07/12/2021    Final  ?                 Value:NO SQUAMOUS EPITHELIAL CELLS SEEN ?RARE WBC SEEN ?NO ORGANISMS SEEN ?  ? Culture 07/12/2021    Final  ?  Value:NO GROWTH 4 DAYS CONTINUING TO HOLD ?Performed at [facility], [place] [address] [place], [place] [address] ?  ? Report Status 07/12/2021 PENDING   Incomplete  ? Specimen Description 07/12/2021    Final  ?                 Value:TISSUE ?Performed at [facility], [place] [address] [place], [place] [address] ?  ? Special Requests 07/12/2021    Final  ?                 Value:NONE RIGHT GLENOID CULTURE ?Performed at [facility], [place] [address] [place], [place] [address] ?  ? Gram  Stain 07/12/2021    Final  ?                 Value:NO SQUAMOUS EPITHELIAL CELLS SEEN ?FEW WBC SEEN ?NO ORGANISMS SEEN ?  ? Culture 07/12/2021    Final  ?                 Value:NO GROWTH 4 DAYS CONTINUING TO HOLD ?Performed at [facility], [place] [address] [place], [place] [address] ?  ? Report Status 07/12/2021 PENDING   Incomplete  ? Creatinine, Ser 07/13/2021 0.85  0.44 - 1.00 mg/dL Final  ? GFR, Estimated 07/13/2021 >60  >60 mL/min Final  ? Comment: (NOTE) ?Calculated using the CKD-EPI Creatinine Equation (2021) ?Performed at [facility], [place] [address] ?[place], [place] [address] ?  ? Total CK 07/13/2021 262 (H)  38 - 234 U/L Final  ? Performed at [facility], [place] [address], [place], [place] [address]  ? Sed Rate 07/13/2021 14  0 - 22 mm/hr Final  ? Performed at [facility], [place] [address] [place], [place] [address]  ? CRP 07/13/2021 6.5 (H)  <1.0 mg/dL Final  ? Performed at [place] [address], [place], [place] [address]  ?Hospital Outpatient Visit on 06/20/2021  ?Component Date Value Ref Range Status  ? Sodium 06/20/2021 134 (L)  135 - 145 mmol/L Final  ? Potassium 06/20/2021 3.9  3.5 - 5.1 mmol/L Final  ? Chloride 06/20/2021 99  98 - 111 mmol/L Final  ? CO2 06/20/2021 26  22 - 32 mmol/L Final  ? Glucose, Bld 06/20/2021 82  70 - 99 mg/dL Final  ? Glucose reference range applies only to samples taken after fasting for at least 8 hours.  ? BUN 06/20/2021 21  8 - 23 mg/dL Final  ? Creatinine, Ser 06/20/2021 0.97  0.44 - 1.00 mg/dL Final  ? Calcium 06/20/2021 9.6  8.9 - 10.3 mg/dL Final  ? GFR, Estimated 06/20/2021 >60  >60 mL/min Final  ? Comment: (NOTE) ?Calculated using the CKD-EPI Creatinine Equation (2021) ?  ? Anion gap 06/20/2021 9  5 - 15 Final  ? Performed at [facility], [place] [address] [place], [place] [address]  ? WBC 06/20/2021 7.3  4.0 - 10.5 K/uL Final  ? RBC 06/20/2021 4.02  3.87 - 5.11 MIL/uL  Final  ? Hemoglobin 06/20/2021 12.2  12.0 - 15.0 g/dL Final  ? HCT 06/20/2021 37.2  36.0 - 46.0 % Final  ? MCV 06/20/2021 92.5  80.0 - 100.0 fL Final  ? MCH 06/20/2021 30.3  26.0 - 34.0 pg Final  ? MCHC

## 2021-07-17 DIAGNOSIS — T8459XA Infection and inflammatory reaction due to other internal joint prosthesis, initial encounter: Secondary | ICD-10-CM | POA: Diagnosis not present

## 2021-07-23 ENCOUNTER — Other Ambulatory Visit: Payer: Self-pay

## 2021-07-23 ENCOUNTER — Telehealth (INDEPENDENT_AMBULATORY_CARE_PROVIDER_SITE_OTHER): Payer: Medicare PPO | Admitting: Internal Medicine

## 2021-07-23 ENCOUNTER — Telehealth: Payer: Self-pay

## 2021-07-23 DIAGNOSIS — Z96611 Presence of right artificial shoulder joint: Secondary | ICD-10-CM

## 2021-07-23 DIAGNOSIS — T8459XA Infection and inflammatory reaction due to other internal joint prosthesis, initial encounter: Secondary | ICD-10-CM | POA: Diagnosis not present

## 2021-07-23 NOTE — Progress Notes (Signed)
Virtual Visit via Telephone Note ? ?I connected with Natalie [name] on 07/23/21 at 11:30 AM EDT by telephone and verified that I am speaking with the correct person using two identifiers. ? ?Location: ?Patient: at home ?Provider:at clinic ?  ?I discussed the limitations, risks, security and privacy concerns of performing an evaluation and management service by telephone and the availability of in person appointments. I also discussed with the patient that there may be a patient responsible charge related to this service. The patient expressed understanding and agreed to proceed. ? ? ?History of Present Illness: ? ?She is being treated for periprosthetic right shoulder infection. She underwent explantation of HW on 3/23, has new abtx spacer. Cultures negative at 10 days out from surgery. Has been on ceftriaxone plus daptomycin. She is still having some pain with right shoulder, either using pain medication or ice packs. She sees dr supple on Wednesday. No issues with taking abtx ?  ?Observations/Objective: ? ?Fluid speech ?Assessment and Plan: ? ?Plan to continue with ceftriaxone 2gm IV daily plus daptomycin daily through may 5th  for presumed right periprosthetic joint infection ? ?Follow Up Instructions: ? ?  ?I discussed the assessment and treatment plan with the patient. The patient was provided an opportunity to ask questions and all were answered. The patient agreed with the plan and demonstrated an understanding of the instructions. ?  ?The patient was advised to call back or seek an in-person evaluation if the symptoms worsen or if the condition fails to improve as anticipated. ? ?I provided 10 minutes of non-face-to-face time during this encounter. ? ? ?Carlyle Basques, MD  ?

## 2021-07-23 NOTE — Telephone Encounter (Signed)
Attempted to call patient prior to MyChart video visit to complete rooming questions. No answer. Voicemailbox full and unable to leave a message. ? ?Patient was present on the MyChart video call and the provider proceeded with the visit. ? ?Binnie Kand, RN  ?

## 2021-07-25 DIAGNOSIS — Z4789 Encounter for other orthopedic aftercare: Secondary | ICD-10-CM | POA: Diagnosis not present

## 2021-07-27 DIAGNOSIS — M009 Pyogenic arthritis, unspecified: Secondary | ICD-10-CM | POA: Diagnosis not present

## 2021-07-30 DIAGNOSIS — T8459XA Infection and inflammatory reaction due to other internal joint prosthesis, initial encounter: Secondary | ICD-10-CM | POA: Diagnosis not present

## 2021-07-31 ENCOUNTER — Encounter (HOSPITAL_COMMUNITY): Payer: Self-pay | Admitting: Emergency Medicine

## 2021-07-31 ENCOUNTER — Inpatient Hospital Stay (HOSPITAL_COMMUNITY)
Admission: EM | Admit: 2021-07-31 | Discharge: 2021-08-03 | DRG: 196 | Disposition: A | Discharge status: Home Health | Payer: Medicare PPO | Attending: Family Medicine | Admitting: Family Medicine

## 2021-07-31 ENCOUNTER — Ambulatory Visit (HOSPITAL_COMMUNITY)
Admission: RE | Admit: 2021-07-31 | Discharge: 2021-07-31 | Disposition: A | Discharge status: Home / Self Care | Payer: Medicare PPO | Source: Ambulatory Visit | Attending: Family Medicine | Admitting: Family Medicine

## 2021-07-31 ENCOUNTER — Emergency Department (HOSPITAL_COMMUNITY): Payer: Medicare PPO

## 2021-07-31 ENCOUNTER — Other Ambulatory Visit (HOSPITAL_COMMUNITY): Payer: Self-pay | Admitting: Family Medicine

## 2021-07-31 ENCOUNTER — Other Ambulatory Visit: Payer: Self-pay | Admitting: Family Medicine

## 2021-07-31 DIAGNOSIS — M545 Low back pain, unspecified: Secondary | ICD-10-CM | POA: Diagnosis present

## 2021-07-31 DIAGNOSIS — J8281 Chronic eosinophilic pneumonia: Secondary | ICD-10-CM | POA: Diagnosis not present

## 2021-07-31 DIAGNOSIS — J189 Pneumonia, unspecified organism: Secondary | ICD-10-CM | POA: Diagnosis present

## 2021-07-31 DIAGNOSIS — D649 Anemia, unspecified: Secondary | ICD-10-CM | POA: Diagnosis present

## 2021-07-31 DIAGNOSIS — J8282 Acute eosinophilic pneumonia: Principal | ICD-10-CM | POA: Diagnosis present

## 2021-07-31 DIAGNOSIS — R0602 Shortness of breath: Secondary | ICD-10-CM | POA: Diagnosis not present

## 2021-07-31 DIAGNOSIS — Z882 Allergy status to sulfonamides status: Secondary | ICD-10-CM | POA: Diagnosis not present

## 2021-07-31 DIAGNOSIS — R509 Fever, unspecified: Secondary | ICD-10-CM | POA: Diagnosis not present

## 2021-07-31 DIAGNOSIS — E785 Hyperlipidemia, unspecified: Secondary | ICD-10-CM | POA: Diagnosis not present

## 2021-07-31 DIAGNOSIS — I10 Essential (primary) hypertension: Secondary | ICD-10-CM | POA: Diagnosis present

## 2021-07-31 DIAGNOSIS — J9601 Acute respiratory failure with hypoxia: Secondary | ICD-10-CM | POA: Diagnosis present

## 2021-07-31 DIAGNOSIS — K219 Gastro-esophageal reflux disease without esophagitis: Secondary | ICD-10-CM | POA: Diagnosis present

## 2021-07-31 DIAGNOSIS — Z888 Allergy status to other drugs, medicaments and biological substances status: Secondary | ICD-10-CM | POA: Diagnosis not present

## 2021-07-31 DIAGNOSIS — Z881 Allergy status to other antibiotic agents status: Secondary | ICD-10-CM | POA: Diagnosis not present

## 2021-07-31 DIAGNOSIS — Z452 Encounter for adjustment and management of vascular access device: Secondary | ICD-10-CM | POA: Diagnosis not present

## 2021-07-31 DIAGNOSIS — J188 Other pneumonia, unspecified organism: Secondary | ICD-10-CM | POA: Diagnosis present

## 2021-07-31 DIAGNOSIS — Z85828 Personal history of other malignant neoplasm of skin: Secondary | ICD-10-CM | POA: Diagnosis not present

## 2021-07-31 DIAGNOSIS — Z79899 Other long term (current) drug therapy: Secondary | ICD-10-CM

## 2021-07-31 DIAGNOSIS — Z791 Long term (current) use of non-steroidal anti-inflammatories (NSAID): Secondary | ICD-10-CM | POA: Diagnosis not present

## 2021-07-31 DIAGNOSIS — Z96619 Presence of unspecified artificial shoulder joint: Secondary | ICD-10-CM | POA: Diagnosis not present

## 2021-07-31 DIAGNOSIS — R0902 Hypoxemia: Principal | ICD-10-CM

## 2021-07-31 DIAGNOSIS — Z7982 Long term (current) use of aspirin: Secondary | ICD-10-CM

## 2021-07-31 DIAGNOSIS — E876 Hypokalemia: Secondary | ICD-10-CM | POA: Diagnosis not present

## 2021-07-31 DIAGNOSIS — Z20822 Contact with and (suspected) exposure to covid-19: Secondary | ICD-10-CM | POA: Diagnosis present

## 2021-07-31 DIAGNOSIS — Z96611 Presence of right artificial shoulder joint: Secondary | ICD-10-CM | POA: Diagnosis present

## 2021-07-31 DIAGNOSIS — R0981 Nasal congestion: Secondary | ICD-10-CM | POA: Diagnosis not present

## 2021-07-31 DIAGNOSIS — T368X5A Adverse effect of other systemic antibiotics, initial encounter: Secondary | ICD-10-CM | POA: Diagnosis present

## 2021-07-31 DIAGNOSIS — J704 Drug-induced interstitial lung disorders, unspecified: Secondary | ICD-10-CM | POA: Diagnosis present

## 2021-07-31 DIAGNOSIS — J029 Acute pharyngitis, unspecified: Secondary | ICD-10-CM | POA: Diagnosis not present

## 2021-07-31 DIAGNOSIS — Z1152 Encounter for screening for COVID-19: Secondary | ICD-10-CM | POA: Diagnosis not present

## 2021-07-31 DIAGNOSIS — Z8249 Family history of ischemic heart disease and other diseases of the circulatory system: Secondary | ICD-10-CM | POA: Diagnosis not present

## 2021-07-31 DIAGNOSIS — R051 Acute cough: Secondary | ICD-10-CM | POA: Diagnosis not present

## 2021-07-31 DIAGNOSIS — T8459XD Infection and inflammatory reaction due to other internal joint prosthesis, subsequent encounter: Secondary | ICD-10-CM | POA: Diagnosis not present

## 2021-07-31 LAB — URINALYSIS, ROUTINE W REFLEX MICROSCOPIC
Bacteria, UA: NONE SEEN
Bilirubin Urine: NEGATIVE
Glucose, UA: NEGATIVE mg/dL
Ketones, ur: NEGATIVE mg/dL
Leukocytes,Ua: NEGATIVE
Nitrite: NEGATIVE
Protein, ur: NEGATIVE mg/dL
Specific Gravity, Urine: 1.031 — ABNORMAL HIGH (ref 1.005–1.030)
pH: 5 (ref 5.0–8.0)

## 2021-07-31 LAB — CBC WITH DIFFERENTIAL/PLATELET
Abs Immature Granulocytes: 0.04 10*3/uL (ref 0.00–0.07)
Basophils Absolute: 0 10*3/uL (ref 0.0–0.1)
Basophils Relative: 0 %
Eosinophils Absolute: 1.3 10*3/uL — ABNORMAL HIGH (ref 0.0–0.5)
Eosinophils Relative: 11 %
HCT: 28.1 % — ABNORMAL LOW (ref 36.0–46.0)
Hemoglobin: 9.3 g/dL — ABNORMAL LOW (ref 12.0–15.0)
Immature Granulocytes: 0 %
Lymphocytes Relative: 9 %
Lymphs Abs: 1 10*3/uL (ref 0.7–4.0)
MCH: 30.5 pg (ref 26.0–34.0)
MCHC: 33.1 g/dL (ref 30.0–36.0)
MCV: 92.1 fL (ref 80.0–100.0)
Monocytes Absolute: 0.7 10*3/uL (ref 0.1–1.0)
Monocytes Relative: 6 %
Neutro Abs: 8.1 10*3/uL — ABNORMAL HIGH (ref 1.7–7.7)
Neutrophils Relative %: 74 %
Platelets: 312 10*3/uL (ref 150–400)
RBC: 3.05 MIL/uL — ABNORMAL LOW (ref 3.87–5.11)
RDW: 14.2 % (ref 11.5–15.5)
WBC: 11 10*3/uL — ABNORMAL HIGH (ref 4.0–10.5)
nRBC: 0 % (ref 0.0–0.2)

## 2021-07-31 LAB — COMPREHENSIVE METABOLIC PANEL
ALT: 15 U/L (ref 0–44)
AST: 13 U/L — ABNORMAL LOW (ref 15–41)
Albumin: 3.6 g/dL (ref 3.5–5.0)
Alkaline Phosphatase: 68 U/L (ref 38–126)
Anion gap: 10 (ref 5–15)
BUN: 14 mg/dL (ref 8–23)
CO2: 25 mmol/L (ref 22–32)
Calcium: 9.2 mg/dL (ref 8.9–10.3)
Chloride: 100 mmol/L (ref 98–111)
Creatinine, Ser: 0.78 mg/dL (ref 0.44–1.00)
GFR, Estimated: >60 mL/min (ref >60)
Glucose, Bld: 106 mg/dL — ABNORMAL HIGH (ref 70–99)
Potassium: 3.2 mmol/L — ABNORMAL LOW (ref 3.5–5.1)
Sodium: 135 mmol/L (ref 135–145)
Total Bilirubin: 1 mg/dL (ref 0.3–1.2)
Total Protein: 7 g/dL (ref 6.5–8.1)

## 2021-07-31 LAB — I-STAT CHEM 8, ED
BUN: 11 mg/dL (ref 8–23)
Calcium, Ion: 1.18 mmol/L (ref 1.15–1.40)
Chloride: 99 mmol/L (ref 98–111)
Creatinine, Ser: 0.7 mg/dL (ref 0.44–1.00)
Glucose, Bld: 107 mg/dL — ABNORMAL HIGH (ref 70–99)
HCT: 29 % — ABNORMAL LOW (ref 36.0–46.0)
Hemoglobin: 9.9 g/dL — ABNORMAL LOW (ref 12.0–15.0)
Potassium: 3.3 mmol/L — ABNORMAL LOW (ref 3.5–5.1)
Sodium: 135 mmol/L (ref 135–145)
TCO2: 24 mmol/L (ref 22–32)

## 2021-07-31 LAB — APTT: aPTT: 31 seconds (ref 24–36)

## 2021-07-31 LAB — PROTIME-INR
INR: 1.2 (ref 0.8–1.2)
Prothrombin Time: 15.3 seconds — ABNORMAL HIGH (ref 11.4–15.2)

## 2021-07-31 LAB — MAGNESIUM: Magnesium: 1.7 mg/dL (ref 1.7–2.4)

## 2021-07-31 LAB — LACTIC ACID, PLASMA: Lactic Acid, Venous: 0.8 mmol/L (ref 0.5–1.9)

## 2021-07-31 MED ORDER — ACETAMINOPHEN 650 MG RE SUPP: 650.0000 mg | Freq: Four times a day (QID) | RECTAL | Status: DC | PRN

## 2021-07-31 MED ORDER — POTASSIUM CHLORIDE CRYS ER 20 MEQ PO TBCR
40.0000 meq | EXTENDED_RELEASE_TABLET | Freq: Once | ORAL | Status: AC
  Administered 2021-07-31: 40 meq via ORAL
  Filled 2021-07-31: qty 2

## 2021-07-31 MED ORDER — IOHEXOL 350 MG/ML SOLN
80.0000 mL | Freq: Once | INTRAVENOUS | Status: AC | PRN
  Administered 2021-07-31: 80 mL via INTRAVENOUS

## 2021-07-31 MED ORDER — NALOXONE HCL 0.4 MG/ML IJ SOLN
0.4000 mg | INTRAMUSCULAR | Status: DC | PRN
Start: 2021-07-31 — End: 2021-08-04

## 2021-07-31 MED ORDER — MORPHINE SULFATE (PF) 4 MG/ML IV SOLN
4.0000 mg | INTRAVENOUS | Status: DC | PRN
  Administered 2021-08-01 – 2021-08-03 (×2): 4 mg via INTRAVENOUS
  Filled 2021-07-31 (×2): qty 1

## 2021-07-31 MED ORDER — MORPHINE SULFATE (PF) 4 MG/ML IV SOLN
4.0000 mg | Freq: Once | INTRAVENOUS | Status: AC
  Administered 2021-07-31: 4 mg via INTRAVENOUS
  Filled 2021-07-31: qty 1

## 2021-07-31 MED ORDER — SODIUM CHLORIDE 0.9 % IV SOLN
550.0000 mg | Freq: Every day | INTRAVENOUS | Status: DC
  Filled 2021-07-31: qty 11

## 2021-07-31 MED ORDER — ACETAMINOPHEN 325 MG PO TABS
650.0000 mg | ORAL_TABLET | Freq: Four times a day (QID) | ORAL | Status: DC | PRN
  Administered 2021-07-31 – 2021-08-03 (×8): 650 mg via ORAL
  Filled 2021-07-31 (×8): qty 2

## 2021-07-31 MED ORDER — SODIUM CHLORIDE 0.9 % IV SOLN
2.0000 g | Freq: Two times a day (BID) | INTRAVENOUS | Status: DC
  Administered 2021-07-31 – 2021-08-02 (×4): 2 g via INTRAVENOUS
  Filled 2021-07-31 (×5): qty 12.5

## 2021-07-31 MED ORDER — BENZONATATE 100 MG PO CAPS
200.0000 mg | ORAL_CAPSULE | Freq: Three times a day (TID) | ORAL | Status: DC | PRN
Start: 2021-07-31 — End: 2021-08-04
  Administered 2021-08-01 (×3): 200 mg via ORAL
  Filled 2021-07-31 (×3): qty 2

## 2021-07-31 NOTE — ED Provider Notes (Signed)
?[facility] DEPT ?Provider Note ? ? ?CSN: [id number] ?Arrival date & time: 07/31/21  1841 ? ?  ? ?History ? ?Chief Complaint  ?Patient presents with  ? Pneumonia  ? ? ?Natalie [name] is a 76 y.o. female hx of R shoulder implant infection on daptomycin and rocephin via PICC line, here presenting with fever and cough and pneumonia on CT.  Patient had washout of the right shoulder implant on 3/23 by Dr. Onnie Graham and has been on daptomycin and Rocephin since then.  She has been followed up with Dr. Baxter Flattery from ID.  Patient states that for the last 3 to 4 days, she has been coughing.  She also has been running fever 101 at home.  Patient went to see her doctor and had a CT scan that showed multifocal pneumonia.  Patient is sent here for admission ? ?The history is provided by the patient.  ? ?  ? ?Home Medications ?Prior to Admission medications   ?Medication Sig Start Date End Date Taking? Authorizing Provider  ?aspirin 81 MG tablet Take 81 mg by mouth daily.    [provider]  ?atenolol-chlorthalidone (TENORETIC) 100-25 MG per tablet Take 0.5 tablets by mouth daily.    [provider]  ?atorvastatin (LIPITOR) 20 MG tablet Take 20 mg by mouth at bedtime.    [provider]  ?betamethasone valerate ointment (VALISONE) 0.1 % Apply 1 application topically 2 (two) times daily. 03/27/21   Tamela Gammon, NP  ?cefTRIAXone (ROCEPHIN) IVPB Inject 2 g into the vein daily. Indication: prosthetic joint infection ?First Dose: Yes ?Last Day of Therapy:  08/24/2021 ?Labs - Once weekly:  CBC/D, BMP, CRP, ESR ?Method of administration: IV Push ?Method of administration may be changed at the discretion of home infusion pharmacist based upon assessment of the patient and/or caregiver's ability to self-administer the medication ordered. 07/13/21 08/24/21  Shuford, Olivia Mackie, PA-C  ?Cholecalciferol (VITAMIN D) 125 MCG (5000 UT) CAPS Take 5,000 Units by mouth daily.    [provider]  ?daptomycin (CUBICIN) IVPB Inject 550 mg into the vein daily. Indication:  prosthetic joint infection ?First Dose: Yes ?Last Day of Therapy:  08/24/2021 ?Labs - Once weekly:  CBC/D, BMP, CRP, ESR, and CK ?Method of administration: IV Push ?Method of administration may be changed at the discretion of home infusion pharmacist based upon assessment of the patient and/or caregiver's ability to self-administer the medication ordered. 07/13/21 08/24/21  Shuford, Olivia Mackie, PA-C  ?hydroxypropyl methylcellulose / hypromellose (ISOPTO TEARS / GONIOVISC) 2.5 % ophthalmic solution Place 1 drop into both eyes as needed for dry eyes.    [provider]  ?losartan (COZAAR) 50 MG tablet Take 50 mg by mouth daily. 06/10/18   [provider]  ?meloxicam (MOBIC) 15 MG tablet Take 1 tablet (15 mg total) by mouth at bedtime. 09/07/20   Shuford, Olivia Mackie, PA-C  ?methocarbamol (ROBAXIN) 500 MG tablet Take 1 tablet (500 mg total) by mouth 4 (four) times daily. 07/14/21   Jonelle Sidle D, PA  ?mupirocin ointment (BACTROBAN) 2 % Apply 1 application. topically 2 (two) times daily. 06/26/21   Trula Slade, DPM  ?omeprazole (PRILOSEC) 20 MG capsule TAKE 1 CAPSULE (20 MG TOTAL) TWO TIMES DAILY. 10/06/17   Mannam, Hart Robinsons, MD  ?ondansetron (ZOFRAN) 4 MG tablet Take 1 tablet (4 mg total) by mouth every 8 (eight) hours as needed for nausea or vomiting. 07/14/21   Jonelle Sidle D, PA  ?oxyCODONE-acetaminophen (PERCOCET) 5-325 MG tablet Take 1 tablet  by mouth every 4 (four) hours as needed (max 6 q). 07/13/21   Shuford, Olivia Mackie, PA-C  ?pramipexole (MIRAPEX) 0.5 MG tablet Take 0.5 mg by mouth at bedtime. 12/26/17   [provider]  ?   ? ?Allergies    ?Cephalexin, Nitrofurantoin monohyd macro, and Sulfa antibiotics   ? ?Review of Systems   ?Review of Systems  ?Constitutional:  Positive for fever.  ?Respiratory:  Positive for cough.   ?All other systems reviewed and are negative. ? ?Physical Exam ?Updated Vital Signs ?BP (!)  165/104 (BP Location: Left Leg)   Pulse 88   Temp 97.8 ?F (36.6 ?C) (Oral)   Resp 18   Ht _0  (1.575 m)   Wt 68 kg   SpO2 97%   BMI 27.44 kg/m?  ?Physical Exam ?Vitals and nursing note reviewed.  ?Constitutional:   ?   Comments: Chronically ill  ?HENT:  ?   Head: Normocephalic.  ?   Nose: Nose normal.  ?   Mouth/Throat:  ?   Mouth: Mucous membranes are moist.  ?Eyes:  ?   Extraocular Movements: Extraocular movements intact.  ?   Pupils: Pupils are equal, round, and reactive to light.  ?Cardiovascular:  ?   Rate and Rhythm: Normal rate and regular rhythm.  ?   Pulses: Normal pulses.  ?   Heart sounds: Normal heart sounds.  ?Pulmonary:  ?   Comments: Slightly tachypneic and crackles bilateral bases ?Abdominal:  ?   General: Abdomen is flat.  ?   Palpations: Abdomen is soft.  ?Musculoskeletal:  ?   Cervical back: Normal range of motion and neck supple.  ?   Comments: Left PICC line with no obvious sign of line infection  ?Skin: ?   General: Skin is warm.  ?Neurological:  ?   General: No focal deficit present.  ?   Mental Status: She is oriented to person, place, and time.  ?Psychiatric:     ?   Mood and Affect: Mood normal.     ?   Behavior: Behavior normal.  ? ? ?ED Results / Procedures / Treatments   ?Labs ?(all labs ordered are listed, but only abnormal results are displayed) ?Labs Reviewed  ?URINALYSIS, ROUTINE W REFLEX MICROSCOPIC - Abnormal; Notable for the following components:  ?    Result Value  ? Color, Urine STRAW (*)   ? Specific Gravity, Urine 1.031 (*)   ? Hgb urine dipstick SMALL (*)   ? All other components within normal limits  ?CULTURE, BLOOD (ROUTINE X 2)  ?CULTURE, BLOOD (ROUTINE X 2)  ?URINE CULTURE  ?LACTIC ACID, PLASMA  ?LACTIC ACID, PLASMA  ?COMPREHENSIVE METABOLIC PANEL  ?CBC WITH DIFFERENTIAL/PLATELET  ?PROTIME-INR  ?APTT  ? ? ?EKG ?EKG Interpretation ? ?Date/Time:  Tuesday July 31 2021 19:13:27 EDT ?Ventricular Rate:  85 ?PR Interval:  190 ?QRS Duration: 87 ?QT Interval:  365 ?QTC  Calculation: 434 ?R Axis:   -14 ?Text Interpretation: Sinus rhythm Abnormal R-wave progression, early transition Left ventricular hypertrophy Anterior Q waves, possibly due to LVH Nonspecific T abnormalities, lateral leads No significant change since last tracing Confirmed by Wandra Arthurs [id number]) on 07/31/2021 8:07:51 PM ? ?Radiology ?CT Angio Chest Pulmonary Embolism (PE) W or WO Contrast ? ?Result Date: 07/31/2021 ?CLINICAL DATA:  Shortness of breath EXAM: CT ANGIOGRAPHY CHEST WITH CONTRAST TECHNIQUE: Multidetector CT imaging of the chest was performed using the standard protocol during bolus administration of intravenous contrast. Multiplanar CT image reconstructions and MIPs were obtained to  evaluate the vascular anatomy. RADIATION DOSE REDUCTION: This exam was performed according to the departmental dose-optimization program which includes automated exposure control, adjustment of the mA and/or kV according to patient size and/or use of iterative reconstruction technique. CONTRAST:  77m OMNIPAQUE IOHEXOL 350 MG/ML SOLN COMPARISON:  None. FINDINGS: Cardiovascular: Satisfactory opacification of the pulmonary arteries to the segmental level. No evidence of pulmonary embolism. Normal heart size. No pericardial effusion. Mediastinum/Nodes: No enlarged nodes. Thyroid and esophagus are unremarkable. Lungs/Pleura: Bilateral patchy ground-glass greater than consolidative opacities. No pleural effusion. No pneumothorax. Upper Abdomen: Cholelithiasis.  No acute abnormality. Musculoskeletal: No acute osseous abnormality. Partially imaged postoperative changes of the right humerus. Review of the MIP images confirms the above findings. IMPRESSION: No evidence of acute pulmonary embolism. Multifocal pneumonia. Electronically Signed   By: PMacy MisM.D.   On: 07/31/2021 18:07   ? ?Procedures ?Procedures  ? ? ?CRITICAL CARE ?Performed by: DWandra Arthurs? ? ?Total critical care time: 30 minutes ? ?Critical care time was  exclusive of separately billable procedures and treating other patients. ? ?Critical care was necessary to treat or prevent imminent or life-threatening deterioration. ? ?Critical care was time spent personally by me on

## 2021-07-31 NOTE — ED Triage Notes (Signed)
Per patient, states she is coming from PCP's office-states she was diagnosed with PNA-states cough,fever, SOB since this past Friday-patient is currently on IV antibiotics for infected replacement shoulder ?

## 2021-07-31 NOTE — ED Notes (Signed)
Pt prefer lab work to drawn from port ?

## 2021-07-31 NOTE — ED Notes (Signed)
Patient provided with extra pillow, soda, and crackers per request.   ? ?

## 2021-07-31 NOTE — ED Provider Triage Note (Signed)
Emergency Medicine Provider Triage Evaluation Note ? ?Natalie [name] , a 76 y.o. female  was evaluated in triage.  Pt complains of pneumonia.  Patient was advised by her PCP to come to the emergency department for treatment of pneumonia. CT angio chest earlier today shows multifocal pneumonia. Patient endorses cough, shortness of breath, and fever since Friday. Patient currently has PICC line in left arm for IV abx due to periprosthetic joint infection. Right shoulder replacement was removed and antibiotic spacer placed 3 weeks ago by Dr.Supple ?Review of Systems  ?Positive: Shortness of breath, cough ?Negative: Chest pain ? ?Physical Exam  ?There were no vitals taken for this visit. ?Gen:   Awake, no distress   ?Resp:  Increased effort, tachypnea ?MSK:   Moves extremities without difficulty  ?Other:   ? ?Medical Decision Making  ?Medically screening exam initiated at 6:56 PM.  Appropriate orders placed.  Natalie [name] was informed that the remainder of the evaluation will be completed by another provider, this initial triage assessment does not replace that evaluation, and the importance of remaining in the ED until their evaluation is complete. ? ? ?  ?Natalie Peng, PA-C ?07/31/21 1858 ? ?

## 2021-07-31 NOTE — Progress Notes (Signed)
Pharmacy Antibiotic Note ? ?Natalie [name] is a 76 y.o. female who was recently hospitalized from 07/12/21 to 07/14/21 for right shoulder PJI.    She was discharged on 07/14/21 with plan to treat with daptomycin 550 mg q24h and ceftriaxone 2gm q24h through 08/24/21.  She presented to the ED on 07/31/21 with c/o cough, fever and SOB.  Pharmacy was been consulted to resume daptomycin and change ceftriaxone to cefepime for broader coverage for possible bacteremia. ? ?Of note, patient reported that she took her home daptomycin dose at 1:30p today (07/31/21) ? ?Plan: ?- cefepime 2gm q12h ?- resume PTA daptomycin dose of 550 mg IV q24h starting on 08/01/21 ? ?______________________________________ ? ?Height: '5\' 2"'$  (157.5 cm) ?Weight: 68 kg (150 lb) ?IBW/kg (Calculated) : 50.1 ? ?Temp (24hrs), Avg:97.8 ?F (36.6 ?C), Min:97.8 ?F (36.6 ?C), Max:97.8 ?F (36.6 ?C) ? ?No results for input(s): WBC, CREATININE, LATICACIDVEN, VANCOTROUGH, VANCOPEAK, VANCORANDOM, GENTTROUGH, GENTPEAK, GENTRANDOM, TOBRATROUGH, TOBRAPEAK, TOBRARND, AMIKACINPEAK, AMIKACINTROU, AMIKACIN in the last 168 hours.  ?Estimated Creatinine Clearance: 50.1 mL/min (by C-G formula based on SCr of 0.85 mg/dL).   ? ?Allergies  ?Allergen Reactions  ? Cephalexin   ?  Can not recall reaction  ? Nitrofurantoin Monohyd Macro   ?  Call not recall reaction  ? Sulfa Antibiotics Nausea Only  ? ? ? ?Thank you for allowing pharmacy to be a part of this patient?s care. ? Lynelle Doctor ?07/31/2021 8:48 PM ? ?

## 2021-08-01 ENCOUNTER — Encounter (HOSPITAL_COMMUNITY): Payer: Self-pay | Admitting: Internal Medicine

## 2021-08-01 ENCOUNTER — Other Ambulatory Visit: Payer: Self-pay

## 2021-08-01 DIAGNOSIS — D649 Anemia, unspecified: Secondary | ICD-10-CM | POA: Diagnosis not present

## 2021-08-01 DIAGNOSIS — J9601 Acute respiratory failure with hypoxia: Secondary | ICD-10-CM | POA: Diagnosis present

## 2021-08-01 DIAGNOSIS — E876 Hypokalemia: Secondary | ICD-10-CM | POA: Diagnosis present

## 2021-08-01 DIAGNOSIS — M545 Low back pain, unspecified: Secondary | ICD-10-CM | POA: Diagnosis present

## 2021-08-01 DIAGNOSIS — Z96619 Presence of unspecified artificial shoulder joint: Secondary | ICD-10-CM

## 2021-08-01 DIAGNOSIS — J8281 Chronic eosinophilic pneumonia: Secondary | ICD-10-CM | POA: Diagnosis present

## 2021-08-01 DIAGNOSIS — T8459XD Infection and inflammatory reaction due to other internal joint prosthesis, subsequent encounter: Secondary | ICD-10-CM

## 2021-08-01 LAB — CBC WITH DIFFERENTIAL/PLATELET
Abs Immature Granulocytes: 0.04 10*3/uL (ref 0.00–0.07)
Basophils Absolute: 0 10*3/uL (ref 0.0–0.1)
Basophils Relative: 0 %
Eosinophils Absolute: 1.2 10*3/uL — ABNORMAL HIGH (ref 0.0–0.5)
Eosinophils Relative: 12 %
HCT: 27.4 % — ABNORMAL LOW (ref 36.0–46.0)
Hemoglobin: 8.8 g/dL — ABNORMAL LOW (ref 12.0–15.0)
Immature Granulocytes: 0 %
Lymphocytes Relative: 10 %
Lymphs Abs: 1 10*3/uL (ref 0.7–4.0)
MCH: 29.9 pg (ref 26.0–34.0)
MCHC: 32.1 g/dL (ref 30.0–36.0)
MCV: 93.2 fL (ref 80.0–100.0)
Monocytes Absolute: 0.7 10*3/uL (ref 0.1–1.0)
Monocytes Relative: 7 %
Neutro Abs: 7.2 10*3/uL (ref 1.7–7.7)
Neutrophils Relative %: 71 %
Platelets: 299 10*3/uL (ref 150–400)
RBC: 2.94 MIL/uL — ABNORMAL LOW (ref 3.87–5.11)
RDW: 14.2 % (ref 11.5–15.5)
WBC: 10.1 10*3/uL (ref 4.0–10.5)
nRBC: 0 % (ref 0.0–0.2)

## 2021-08-01 LAB — COMPREHENSIVE METABOLIC PANEL
ALT: 14 U/L (ref 0–44)
AST: 10 U/L — ABNORMAL LOW (ref 15–41)
Albumin: 3.3 g/dL — ABNORMAL LOW (ref 3.5–5.0)
Alkaline Phosphatase: 59 U/L (ref 38–126)
Anion gap: 9 (ref 5–15)
BUN: 19 mg/dL (ref 8–23)
CO2: 24 mmol/L (ref 22–32)
Calcium: 8.9 mg/dL (ref 8.9–10.3)
Chloride: 104 mmol/L (ref 98–111)
Creatinine, Ser: 0.88 mg/dL (ref 0.44–1.00)
GFR, Estimated: >60 mL/min (ref >60)
Glucose, Bld: 102 mg/dL — ABNORMAL HIGH (ref 70–99)
Potassium: 3.7 mmol/L (ref 3.5–5.1)
Sodium: 137 mmol/L (ref 135–145)
Total Bilirubin: 0.3 mg/dL (ref 0.3–1.2)
Total Protein: 6.5 g/dL (ref 6.5–8.1)

## 2021-08-01 LAB — SEDIMENTATION RATE: Sed Rate: 58 mm/hr — ABNORMAL HIGH (ref 0–22)

## 2021-08-01 LAB — RETICULOCYTES
Immature Retic Fract: 14.3 % (ref 2.3–15.9)
RBC.: 2.91 MIL/uL — ABNORMAL LOW (ref 3.87–5.11)
Retic Count, Absolute: 48.6 10*3/uL (ref 19.0–186.0)
Retic Ct Pct: 1.7 % (ref 0.4–3.1)

## 2021-08-01 LAB — RESP PANEL BY RT-PCR (FLU A&B, COVID) ARPGX2
Influenza A by PCR: NEGATIVE
Influenza B by PCR: NEGATIVE
SARS Coronavirus 2 by RT PCR: NEGATIVE

## 2021-08-01 LAB — IRON AND TIBC
Iron: 25 ug/dL — ABNORMAL LOW (ref 28–170)
Saturation Ratios: 10 % — ABNORMAL LOW (ref 10.4–31.8)
TIBC: 251 ug/dL (ref 250–450)
UIBC: 226 ug/dL

## 2021-08-01 LAB — PHOSPHORUS: Phosphorus: 3.9 mg/dL (ref 2.5–4.6)

## 2021-08-01 LAB — MAGNESIUM: Magnesium: 1.8 mg/dL (ref 1.7–2.4)

## 2021-08-01 LAB — FERRITIN: Ferritin: 113 ng/mL (ref 11–307)

## 2021-08-01 LAB — BRAIN NATRIURETIC PEPTIDE: B Natriuretic Peptide: 165.3 pg/mL — ABNORMAL HIGH (ref 0.0–100.0)

## 2021-08-01 LAB — CK: Total CK: 74 U/L (ref 38–234)

## 2021-08-01 LAB — FOLATE: Folate: 13.7 ng/mL (ref >5.9)

## 2021-08-01 LAB — STREP PNEUMONIAE URINARY ANTIGEN: Strep Pneumo Urinary Antigen: NEGATIVE

## 2021-08-01 LAB — PROCALCITONIN: Procalcitonin: <0.1 ng/mL

## 2021-08-01 LAB — C-REACTIVE PROTEIN: CRP: 22.2 mg/dL — ABNORMAL HIGH (ref <1.0)

## 2021-08-01 MED ORDER — CHLORHEXIDINE GLUCONATE CLOTH 2 % EX PADS
6.0000 | MEDICATED_PAD | Freq: Every day | CUTANEOUS | Status: DC
  Administered 2021-08-02 – 2021-08-03 (×2): 6 via TOPICAL

## 2021-08-01 MED ORDER — DOXYCYCLINE HYCLATE 100 MG PO TABS
100.0000 mg | ORAL_TABLET | Freq: Two times a day (BID) | ORAL | Status: DC
  Administered 2021-08-01 – 2021-08-03 (×4): 100 mg via ORAL
  Filled 2021-08-01 (×4): qty 1

## 2021-08-01 MED ORDER — METHYLPREDNISOLONE SODIUM SUCC 125 MG IJ SOLR
80.0000 mg | Freq: Every day | INTRAMUSCULAR | Status: DC
  Administered 2021-08-01 – 2021-08-03 (×3): 80 mg via INTRAVENOUS
  Filled 2021-08-01 (×3): qty 2

## 2021-08-01 MED ORDER — ATENOLOL 50 MG PO TABS
50.0000 mg | ORAL_TABLET | Freq: Every day | ORAL | Status: DC
  Administered 2021-08-01 – 2021-08-03 (×3): 50 mg via ORAL
  Filled 2021-08-01 (×3): qty 1

## 2021-08-01 MED ORDER — DOCUSATE SODIUM 100 MG PO CAPS
100.0000 mg | ORAL_CAPSULE | Freq: Once | ORAL | Status: AC
  Administered 2021-08-01: 100 mg via ORAL
  Filled 2021-08-01: qty 1

## 2021-08-01 NOTE — Assessment & Plan Note (Addendum)
Acute. Likely etiology for multifocal pneumonia per ID. Daptomycin discontinued. Pulmonology consulted. Patient started on Solu-medrol 80 mg IV daily with recommendations for taper on discharge followed by outpatient follow-up with pulmonology. Prednisone 40 mg daily x5 days followed by taper of 30 mg daily x5 days, 20 mg daily x5 days and finally 10 mg daily x5 days. ?

## 2021-08-01 NOTE — Assessment & Plan Note (Addendum)
Hypoxia with SpO2 of 89% and cough. In setting of multifocal pneumonia. Weaned to room air. ?

## 2021-08-01 NOTE — Consult Note (Signed)
? ? ? ?Date of Admission:  07/31/2021    ?      ?Reason for Consult:  Bilateral pneumonia in patient on IV  abx   ?Referring Provider: Cordelia Poche, MD ? ? ?Assessment: ? 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 ?Plan: ? ?DC daptomycin ?Start steroids-d/w Pulmonary for recs ?Would make Pulmomary aware and get their recs re the steroids, hopefully we have caught this Drug induced lug injury quickly enough ?OK to continue cefepime for now and adding doxycyline to take place of cubicin for coverage of her joint ? ? Dr. West Bali to see the patient tomorrow and Dr. Candiss Norse is covering on Friday. ? ?Principal Problem: ?  Multifocal pneumonia ?Active Problems: ?  Hyperlipidemia ?  Hypertension ?  Acute respiratory failure with hypoxia ([place]) ?  Acute low back pain ?  Hypokalemia ?  Acute anemia ? ? ?Scheduled Meds: ? doxycycline  100 mg Oral Q12H  ? ?Continuous Infusions: ? ceFEPime (MAXIPIME) IV Stopped (08/01/21 1022)  ? ?PRN Meds:.acetaminophen **OR** acetaminophen, benzonatate, morphine injection, naLOXone (NARCAN)  injection ? ?HPI: Natalie [name] is a 76 y.o. female with history of HTN, HLD, who had right shoulder rotator cuff tear arthropathy and underwent right shoulder reverse arthroplasty in may 2022. Post-operatively did physical therapy but hen started to have worsening right shoulder pain and was taken to the OR by Dr Onnie Graham for  I and D and hardware removal with cultures sent and abx spacer placed. Cultures which are still being held at 20 days have never yielded an organism. The patient has been on ceftriaxone and daptomycin empirically but the last several days develop0ed subjective fevers, chills, with productive cough, orthopnea. ? ?CXR shows bilateral infiltrates c/w multifocal pneumonia.  Her CBC shows eosinophilia.  ? 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 ? ?Will stop dapto add to allergy list. Would give steroids, supportive care and discuss with CCM/ Pullmonary ? ?Will start doxy for MRSE, MRSA coverage in shoulder ? ?OK to continue cefepime for now but again this is highly likely eosinophilic pNA and will be able to go back to Ceftriaxone at DC. ? ?I spent 83 minutes with the patient including than 50% of the time in face to face counseling of the patient and her daughter re eosinophilic PNA, PJI personally reviewing CXR's CBC w diff, CMP long with review of medical records in preparation for the visit and during the visit and in coordination of her care. ? ? ? ?Review of Systems: ?Review of Systems  ?Constitutional:  Positive for chills and fever. Negative for malaise/fatigue and weight loss.  ?HENT:  Negative for congestion and sore throat.   ?Eyes:  Negative for blurred vision and photophobia.  ?Respiratory:  Positive for cough and shortness of breath. Negative for wheezing.   ?Cardiovascular:  Negative for chest pain, palpitations and leg swelling.  ?Gastrointestinal:  Negative for abdominal pain, blood in stool, constipation, diarrhea, heartburn, melena, nausea and vomiting.  ?Genitourinary:  Negative for dysuria, flank pain and hematuria.  ?Musculoskeletal:  Positive for myalgias. Negative for back pain, falls and joint pain.  ?Skin:  Negative for itching and rash.  ?Neurological:  Negative for dizziness, focal weakness, loss of consciousness, weakness and headaches.  ?Endo/Heme/Allergies:  Does not bruise/bleed easily.  ?Psychiatric/Behavioral:  Negative for depression and suicidal ideas. The patient does not have insomnia.   ? ?Past Medical History:  ?Diagnosis Date  ? Arthritis   ? Back pain   ? Cancer [facility])   ? Change in hearing   ? Generalized headaches   ? GERD (gastroesophageal reflux disease)   ? Heart murmur   ? Pt denies  ? Hyperlipidemia   ?  Hypertension   ? Joint pain   ? Leg pain   ? Osteopenia 04/2015  ? T score -1.9 FRAX 12%/2.6% stable from prior DEXA  ? PONV (postoperative nausea and vomiting)   ? Skin cancer   ? Varicose veins of leg with pain Left  > Right  ? ? ?Social History  ? ?Tobacco Use  ? Smoking status: Never  ? Smokeless tobacco: Never  ?Vaping Use  ? Vaping Use: Never used  ?Substance Use Topics  ? Alcohol use: No  ? Drug use: No  ? ? ?Family History  ?Problem Relation Age of Onset  ? Heart disease Mother   ? Hypertension Mother   ? Heart disease Father   ? Hypertension Father   ? Diabetes Daughter   ? Heart Problems Daughter   ? ?Allergies  ?Allergen Reactions  ? Cephalexin   ?  Can not recall reaction  ? Nitrofurantoin Monohyd Macro   ?  Call not recall reaction  ? Sulfa Antibiotics Nausea Only  ? ? ?OBJECTIVE: ?Blood pressure 107/81, pulse 96, temperature 98.1 ?F (36.7 ?C), temperature source Oral, resp. rate (!) 27, height '5\' 2"'$  (1.575 m), weight 68 kg, SpO2 94 %. ? ?Physical Exam ?Constitutional:   ?   General: She is not in acute distress. ?   Appearance: Normal appearance. She is well-developed. She is not ill-appearing or diaphoretic.  ?HENT:  ?   Head: Normocephalic and atraumatic.  ?   Right Ear: Hearing and external ear normal.  ?   Left Ear: Hearing and external ear normal.  ?   Nose: No nasal deformity or rhinorrhea.  ?Eyes:  ?   General: No scleral icterus. ?   Conjunctiva/sclera: Conjunctivae normal.  ?   Right eye: Right conjunctiva is not injected.  ?   Left eye: Left conjunctiva is not injected.  ?   Pupils: Pupils are equal, round, and reactive to light.  ?Neck:  ?   Vascular: No JVD.  ?Cardiovascular:  ?   Rate and Rhythm: Normal rate and regular rhythm.  ?   Heart sounds: Normal heart sounds, S1 normal and S2 normal. No murmur heard. ?  No friction rub.  ?Pulmonary:  ?   Effort: Pulmonary effort is normal. No respiratory distress.  ?   Breath sounds: No wheezing.  ?Abdominal:  ?   General: Bowel sounds are normal.  There is no distension.  ?   Palpations: Abdomen is soft.  ?   Tenderness: There is no abdominal tenderness.  ?Musculoskeletal:  ?   Right shoulder: Normal.  ?   Left shoulder: Normal.  ?   Cervical back: Normal range of motion and neck supple.  ?   Right hip: Normal.  ?   Left hip: Normal.  ?   Right knee: Normal.  ?   Left knee: Normal.  ?Lymphadenopathy:  ?   Head:  ?   Right side of head: No submandibular, preauricular or posterior auricular adenopathy.  ?   Left side of head: No submandibular, preauricular or posterior auricular adenopathy.  ?   Cervical: No  cervical adenopathy.  ?   Right cervical: No superficial or deep cervical adenopathy. ?   Left cervical: No superficial or deep cervical adenopathy.  ?Skin: ?   General: Skin is warm and dry.  ?   Coloration: Skin is not pale.  ?   Findings: No abrasion, bruising, ecchymosis, erythema, lesion or rash.  ?   Nails: There is no clubbing.  ?Neurological:  ?   Mental Status: She is alert and oriented to person, place, and time.  ?   Sensory: No sensory deficit.  ?   Coordination: Coordination normal.  ?   Gait: Gait normal.  ?Psychiatric:     ?   Attention and Perception: She is attentive.     ?   Mood and Affect: Mood normal.     ?   Speech: Speech normal.     ?   Behavior: Behavior normal. Behavior is cooperative.     ?   Thought Content: Thought content normal.     ?   Judgment: Judgment normal.  ? ? ?Shoulder in sling  ? ?PICC is clean ? ?Lab Results ?Lab Results  ?Component Value Date  ? WBC 10.1 08/01/2021  ? HGB 8.8 (L) 08/01/2021  ? HCT 27.4 (L) 08/01/2021  ? MCV 93.2 08/01/2021  ? PLT 299 08/01/2021  ?  ?Lab Results  ?Component Value Date  ? CREATININE 0.88 08/01/2021  ? BUN 19 08/01/2021  ? NA 137 08/01/2021  ? K 3.7 08/01/2021  ? CL 104 08/01/2021  ? CO2 24 08/01/2021  ?  ?Lab Results  ?Component Value Date  ? ALT 14 08/01/2021  ? AST 10 (L) 08/01/2021  ? ALKPHOS 59 08/01/2021  ? BILITOT 0.3 08/01/2021  ?  ? ?Microbiology: ?Recent Results (from the  past 240 hour(s))  ?Blood Culture (routine x 2)     Status: None (Preliminary result)  ? Collection Time: 07/31/21  9:05 PM  ? Specimen: BLOOD  ?Result Value Ref Range Status  ? Specimen Description   Final  ?  B

## 2021-08-01 NOTE — Assessment & Plan Note (Addendum)
Patient is on atenolol, chlorthalidone and losartan. Home regimen held on admission secondary to acute infection and concern for development of unstable hemodynamics. Blood pressure uncontrolled. Resume home regimen on discharge. ?

## 2021-08-01 NOTE — Assessment & Plan Note (Addendum)
In setting of recent diagnosis of septic arthritis s/p right shoulder washout, started on daptomycin and ceftriaxone. CT imaging concerning for multifocal pneumonia. Initial recommendations to continue Daptomycin and transition Ceftriaxone to Cefepime. Blood cultures obtained on admission. Infectious disease concerned for eosinophilic pneumonia secondary to daptomycin. ?

## 2021-08-01 NOTE — ED Notes (Signed)
Pt resting comfortable in bed at this time. All comfort and safety measures in place in room with bed in lowest position.   ?

## 2021-08-01 NOTE — Assessment & Plan Note (Addendum)
Resolved with repletion. ?

## 2021-08-01 NOTE — H&P (Signed)
?History and Physical  ? ? ?PLEASE NOTE THAT [employer] SOFTWARE WAS USED IN THE CONSTRUCTION OF THIS NOTE. ? ? ?Leonela Kivi VZC:[id number] DOB: 11/28/1945 DOA: 07/31/2021 ? ?PCP: Prince Solian, MD  ?Patient coming from: home  ? ?I have personally briefly reviewed patient's old medical records in [place] ? ?Chief Complaint: Fever ? ?HPI: Natalie [name] is a 76 y.o. female with medical history significant for recent diagnosis of septic right shoulder implant status post washout procedure, essential hypertension, hyperlipidemia, who is admitted to [facility] on 07/31/2021 with multifocal pneumonia after presenting from home to [facility] ED complaining of fever.  ? ?The patient presents with 3 days of subjective fever in the absence of chills, full body rigors, or generalized myalgias.  She reports that this has been associated with new development of productive cough in the absence of hemoptysis.  She denies overt shortness of breath, orthopnea, PND.  She also denies any associated chest pain, palpitations, diaphoresis, nausea, vomiting, dizziness, presyncope, syncope.  No wheezing or hemoptysis.  No recent abdominal pain, diarrhea, rash.  She also denies any recent dysuria or gross hematuria. ? ?Before any of the subjective fever or acute respiratory symptoms, the patient reports that she developed new onset low back pain a few weeks ago, in the absence of any trauma.  She reports that there is been gradual progression in the intensity of this discomfort, without any radiation into the legs, nor any associated saddle anesthesia, urinary retention or fecal incontinence. ? ?She denies any known history of underlying chronic pulmonary pathology, and confirms no known baseline supplemental oxygen requirements. ? ?In the setting of infected right shoulder implant, she underwent washout procedure via Dr. Randa Evens surgery on 07/12/2021.  In this setting, she has been working closely  with infectious disease, following with Dr.SteinerAs an outpatient.  Per management Dr.Steiner, The patient received PICC line, and has been receiving daptomycin as well as Rocephin for her history of infected right shoulder implant. ? ? ? ?ED Course:  ?Vital signs in the ED were notable for the following: Afebrile; heart rate 89; blood pressure 149/72; respiratory rate 18-27; initial oxygen saturation 89% on room air, with subsequent improvement into the range of 96 to 97% on 2 L nasal cannula. ? ?Labs were notable for the following: CMP notable for the following: Potassium 3.2, bicarbonate 25, creatinine 0.78, liver enzymes found to be within normal limits.  CBC notable for white cell count 11,000 with 74% neutrophils, hemoglobin 9.3 relative to baseline hemoglobin of 11-12, most recent prior hemoglobin noted to be 12.2 on 06/20/2021, platelet count 312.  INR 1.2.  Urinalysis showed no white blood cells, no bacteria and specific gravity 1.031.  Blood cultures x2 were collected prior to initiation of any additional antibiotics. ? ?Imaging and additional notable ED work-up: EKG shows as rhythm with heart rate 85, normal intervals, no evidence of T wave or ST changes, including no evidence of ST elevation.  CTA chest showed no evidence of acute pulmonary embolism, but did show evidence of multifocal pneumonia in the absence of any evidence of edema, effusion, or pneumothorax. ? ?Given that this multifocal pneumonia appears to have developed while on daptomycin Rocephin, EDP discussed the patient's case with on-call infectious disease physician, Dr.Sting, Who recommended continuation of daptomycin, while discontinuing Rocephin in favor of cefepime.  Infectious disease will continue to follow along via consult. ? ?While in the ED, the following were administered: Morphine 4 mg IV x1, cefepime, acetaminophen 650  mg p.o. x1. ? ?Subsequently, the patient was admitted for further evaluation management of multifocal pneumonia  complicated by acute hypoxic respiratory distress, with presenting labs notable for hypokalemia as well as acute anemia. ? ? ?Review of Systems: As per HPI otherwise 10 point review of systems negative.  ? ?Past Medical History:  ?Diagnosis Date  ? Arthritis   ? Back pain   ? Cancer [facility])   ? Change in hearing   ? Generalized headaches   ? GERD (gastroesophageal reflux disease)   ? Heart murmur   ? Pt denies  ? Hyperlipidemia   ? Hypertension   ? Joint pain   ? Leg pain   ? Osteopenia 04/2015  ? T score -1.9 FRAX 12%/2.6% stable from prior DEXA  ? PONV (postoperative nausea and vomiting)   ? Skin cancer   ? Varicose veins of leg with pain Left  > Right  ? ? ?Past Surgical History:  ?Procedure Laterality Date  ? ABDOMINAL HYSTERECTOMY  1982  ? TAH BSO  ? arm surgery  1998 right arm  ? CESAREAN SECTION    ? ECTOPIC PREGNANCY SURGERY    ? ENDOVENOUS ABLATION SAPHENOUS VEIN W/ LASER  left leg 10 years ago per pt.  ? EXCISIONAL TOTAL SHOULDER ARTHROPLASTY WITH ANTIBIOTIC SPACER Right 07/12/2021  ? Procedure: Removal Right Reverse shoulder arthroplasty and placement of antibiotic spacer;  Surgeon: Justice Britain, MD;  Location: [facility] ORS;  Service: Orthopedics;  Laterality: Right;  154mn  ? EXPLORATORY TYMPANOTOMY    ? FINGER SURGERY  2007  right hand 4th and 5th digits  ? REVERSE SHOULDER ARTHROPLASTY Right 09/07/2020  ? Procedure: REVERSE SHOULDER ARTHROPLASTY;  Surgeon: SJustice Britain MD;  Location: [facility] ORS;  Service: Orthopedics;  Laterality: Right;  1244m  ? SHOULDER SURGERY  2019  ? STAPEDECTOMY  2001  ? STAPEDECTOMY  revision 2002  ? TONSILLECTOMY  1954  ? VEIN LIGATION AND STRIPPING  9-12  ? LEFT- RIGHT LEG INJECTED FOR SPIDER VEINS  ? ? ?Social History: ? reports that she has never smoked. She has never used smokeless tobacco. She reports that she does not drink alcohol and does not use drugs. ? ? ?Allergies  ?Allergen Reactions  ? Cephalexin   ?  Can not recall reaction  ? Nitrofurantoin Monohyd Macro   ?  Call not  recall reaction  ? Sulfa Antibiotics Nausea Only  ? ? ?Family History  ?Problem Relation Age of Onset  ? Heart disease Mother   ? Hypertension Mother   ? Heart disease Father   ? Hypertension Father   ? Diabetes Daughter   ? Heart Problems Daughter   ? ? ?Family history reviewed and not pertinent  ? ? ?Prior to Admission medications   ?Medication Sig Start Date End Date Taking? Authorizing Provider  ?aspirin 81 MG tablet Take 81 mg by mouth daily.   Yes [provider]  ?atenolol-chlorthalidone (TENORETIC) 100-25 MG per tablet Take 0.5 tablets by mouth daily.   Yes [provider]  ?atorvastatin (LIPITOR) 20 MG tablet Take 20 mg by mouth at bedtime.   Yes [provider]  ?cefTRIAXone (ROCEPHIN) IVPB Inject 2 g into the vein daily. Indication: prosthetic joint infection ?First Dose: Yes ?Last Day of Therapy:  08/24/2021 ?Labs - Once weekly:  CBC/D, BMP, CRP, ESR ?Method of administration: IV Push ?Method of administration may be changed at the discretion of home infusion pharmacist based upon assessment of the patient and/or caregiver's ability to self-administer the medication  ordered. 07/13/21 08/24/21 Yes Shuford, Olivia Mackie, PA-C  ?Cholecalciferol (VITAMIN D) 125 MCG (5000 UT) CAPS Take 5,000 Units by mouth daily.   Yes [provider]  ?daptomycin (CUBICIN) IVPB Inject 550 mg into the vein daily. Indication:  prosthetic joint infection ?First Dose: Yes ?Last Day of Therapy:  08/24/2021 ?Labs - Once weekly:  CBC/D, BMP, CRP, ESR, and CK ?Method of administration: IV Push ?Method of administration may be changed at the discretion of home infusion pharmacist based upon assessment of the patient and/or caregiver's ability to self-administer the medication ordered. 07/13/21 08/24/21 Yes Shuford, Olivia Mackie, PA-C  ?hydroxypropyl methylcellulose / hypromellose (ISOPTO TEARS / GONIOVISC) 2.5 % ophthalmic solution Place 1 drop into both eyes as needed for dry eyes.   Yes [provider]   ?losartan (COZAAR) 50 MG tablet Take 50 mg by mouth daily. 06/10/18  Yes [provider]  ?meloxicam (MOBIC) 15 MG tablet Take 1 tablet (15 mg total) by mouth at bedtime. ?Patient taking differently: T

## 2021-08-01 NOTE — Assessment & Plan Note (Addendum)
Patient is on Lipitor as an outpatient which was held secondary to Daptomycin use. ?

## 2021-08-01 NOTE — Hospital Course (Addendum)
Natalie [name] is a 76 y.o. female with a history of septic arthritis status post washout on daptomycin and ceftriaxone, hypertension, hyperlipidemia.  Patient presents secondary to subjective fevers.  Imaging on admission concerning for multifocal pneumonia.  Infectious disease consulted and initially recommended continuation of daptomycin and transition from ceftriaxone to cefepime.  New recommendations concerning for eosinophilic pneumonia and daptomycin was discontinued. Pulmonology consulted and steroids initiated. Patient with significant improvement in symptoms. ?

## 2021-08-01 NOTE — Assessment & Plan Note (Addendum)
No midline tenderness. Patient's concern is this could be positional. No lower extremity weakness. Improved. ?

## 2021-08-01 NOTE — Progress Notes (Signed)
? ?PROGRESS NOTE ? ? ? ?Natalie [name]  CXK:[id number] DOB: 1945-11-15 DOA: 07/31/2021 ?PCP: Prince Solian, MD ? ? ?Brief Narrative: ?Natalie [name] is a 76 y.o. female with a history of septic arthritis status post washout on daptomycin and ceftriaxone, hypertension, hyperlipidemia.  Patient presents secondary to subjective fevers.  Imaging on admission concerning for multifocal pneumonia.  Infectious disease consulted and initially recommended continuation of daptomycin and transition from ceftriaxone to cefepime.  New recommendations concerning for eosinophilic pneumonia and daptomycin was discontinued.  Recommendations to start on steroids.  Recommendation for pulmonology consult. ? ? 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 Daptomycin discontinued.- ?-Pulmonology consult ?-Solu-medrol 80 mg IV daily ? ?Acute anemia ?Unsure of etiology. Previous baseline of 11-12. Hemoglobin of 9.3 on admission. No obvious evidence of bleeding. Recent right shoulder washout. Iron panel suggests possible iron deficiency, but not clear cut. Recent colonoscopy in 2020 without mention of diverticulosis/hemorrhoids. Reticulocyte count is abnormally normal. INR slightly elevated. ?-FOBT ?-CBC daily ? ?Acute low back pain ?No midline tenderness. Patient's concern is this could be positional. No lower extremity weakness.  ?-Analgesics prn for now ? ?Acute respiratory failure with hypoxia ([place]) ?Hypoxia with SpO2 of 89% and cough. In setting of multifocal pneumonia. ?-Wean to room air as able ? ?Multifocal pneumonia ?In setting of recent diagnosis of septic arthritis s/p right shoulder washout, started on daptomycin and ceftriaxone. CT imaging concerning for multifocal pneumonia. Initial recommendations to continue Daptomycin and transition Ceftriaxone to Cefepime. Blood cultures obtained on admission. Infectious disease concerned for eosinophilic pneumonia  secondary to daptomycin. ?-See problem, Eosinophilic pneumonia ? ?Hypertension ?Patient is on atenolol, chlorthalidone and losartan. Home regimen held on admission secondary to acute infection and concern for development of unstable hemodynamics. Blood pressure uncontrolled. ?-Resume home atenolol ?-Hold losartan and chlorthalidone for now ? ?Hyperlipidemia ?Patient is on Lipitor as an outpatient which was held secondary to Daptomycin use. ? ?Hypokalemia-resolved as of 08/01/2021 ?Resolved with repletion. ? ? ? ? ?DVT prophylaxis: SCDs ?Code Status:   Code Status: Full Code ?Family Communication: None at bedside ?Disposition Plan: Discharge home likely in several days pending continued management of pneumonia ? ? ?Consultants:  ?Infectious disease ?Pulmonology ? ?Procedures:  ?None ? ?Antimicrobials: ?Daptomycin ?Cefepime ?Doxycycline  ? ? ?Subjective: ?Patient reports some back pain. No significant issues overnight. ? ?Objective: ?BP (!) 164/80 (BP Location: Left Leg)   Pulse 81   Temp (!) 97.5 ?F (36.4 ?C) (Oral)   Resp 16   Ht '5\' 2"'$  (1.575 m)   Wt 68 kg   SpO2 95%   BMI 27.44 kg/m?  ? ?Examination: ? ?General exam: Appears calm and comfortable ?Respiratory system: Diminished but no rales or wheezing Respiratory effort normal. ?Cardiovascular system: S1 & S2 heard, RRR.Marland Kitchen ?Gastrointestinal system: Abdomen is nondistended, soft and nontender. No organomegaly or masses felt. Normal bowel sounds heard. ?Central nervous system: Alert and oriented. No focal neurological deficits. ?Musculoskeletal: No calf tenderness. No midline tenderness. Right shoulder in sling ?Skin: No cyanosis. ?Psychiatry: Judgement and insight appear normal. Mood & affect appropriate.  ? ? ?Data Reviewed: I have personally reviewed following labs and imaging studies ? ?CBC ?Lab Results  ?Component Value Date  ? WBC 10.1 08/01/2021  ? RBC 2.94 (L) 08/01/2021  ? RBC 2.91 (L) 08/01/2021  ? HGB 8.8 (L) 08/01/2021  ? HCT 27.4 (L) 08/01/2021  ?  MCV 93.2 08/01/2021  ? MCH 29.9 08/01/2021  ? PLT 299 08/01/2021  ? MCHC  32.1 08/01/2021  ? RDW 14.2 08/01/2021  ? LYMPHSABS 1.0 08/01/2021  ? MONOABS 0.7 08/01/2021  ? EOSABS 1.2 (H) 08/01/2021  ? BASOSABS 0.0 08/01/2021  ? ? ? ?Last metabolic panel ?Lab Results  ?Component Value Date  ? NA 137 08/01/2021  ? K 3.7 08/01/2021  ? CL 104 08/01/2021  ? CO2 24 08/01/2021  ? BUN 19 08/01/2021  ? CREATININE 0.88 08/01/2021  ? GLUCOSE 102 (H) 08/01/2021  ? GFRNONAA >60 08/01/2021  ? CALCIUM 8.9 08/01/2021  ? PHOS 3.9 08/01/2021  ? PROT 6.5 08/01/2021  ? ALBUMIN 3.3 (L) 08/01/2021  ? BILITOT 0.3 08/01/2021  ? ALKPHOS 59 08/01/2021  ? AST 10 (L) 08/01/2021  ? ALT 14 08/01/2021  ? ANIONGAP 9 08/01/2021  ? ? ?GFR: ?Estimated Creatinine Clearance: 48.4 mL/min (by C-G formula based on SCr of 0.88 mg/dL). ? ?Recent Results (from the past 240 hour(s))  ?Blood Culture (routine x 2)     Status: None (Preliminary result)  ? Collection Time: 07/31/21  9:05 PM  ? Specimen: BLOOD  ?Result Value Ref Range Status  ? Specimen Description   Final  ?  BLOOD PICC LINE ?Performed at [facility], [place] [address], [place], [place] [address] ?  ? Special Requests   Final  ?  BOTTLES DRAWN AEROBIC AND ANAEROBIC Blood Culture results may not be optimal due to an inadequate volume of blood received in culture bottles ?Performed at [facility], [place] [address], [place], [place] [address] ?  ? Culture   Final  ?  NO GROWTH < 12 HOURS ?Performed at [facility], [place] [address], [place], [place] [address] ?  ? Report Status PENDING  Incomplete  ?Blood Culture (routine x 2)     Status: None (Preliminary result)  ? Collection Time: 07/31/21  9:05 PM  ? Specimen: BLOOD  ?Result Value Ref Range Status  ? Specimen Description   Final  ?  BLOOD LEFT ARM ?Performed at [facility], [place] [address] [place], [place] [address] ?  ? Special Requests   Final  ?  BOTTLES DRAWN AEROBIC AND ANAEROBIC Blood  Culture results may not be optimal due to an inadequate volume of blood received in culture bottles ?Performed at [facility], [place] [address], [place], [place] [address] ?  ? Culture   Final  ?  NO GROWTH < 12 HOURS ?Performed at [facility], [place] [address] [place], [place] [address] ?  ? Report Status PENDING  Incomplete  ?Resp Panel by RT-PCR (Flu A&B, Covid) Nasopharyngeal Swab     Status: None  ? Collection Time: 08/01/21  7:42 AM  ? Specimen: Nasopharyngeal Swab; Nasopharyngeal(NP) swabs in vial transport medium  ?Result Value Ref Range Status  ? SARS Coronavirus 2 by RT PCR NEGATIVE NEGATIVE Final  ?  Comment: (NOTE) ?SARS-CoV-2 target nucleic acids are NOT DETECTED. ? ?The SARS-CoV-2 RNA is generally detectable in upper respiratory ?specimens during the acute phase of infection. The lowest ?concentration of SARS-CoV-2 viral copies this assay can detect is ?138 copies/mL. A negative result does not preclude SARS-Cov-2 ?infection and should not be used as the sole basis for treatment or ?other patient management decisions. A negative result may occur with  ?improper specimen collection/handling, submission of specimen other ?than nasopharyngeal swab, presence of viral mutation(s) within the ?areas targeted by this assay, and inadequate number of viral ?copies(<138 copies/mL). A negative result must be combined with ?clinical  observations, patient history, and epidemiological ?information. The expected result is Negative. ? ?Fact Sheet for Patients:  ?EntrepreneurPulse.com.au ? ?Fact Sheet for Healthcare Providers:  ?IncredibleEmployment.be ? ?This test is no t yet approved or cleared by the [place] FDA and  ?has been authorized for detection and/or diagnosis of SARS-CoV-2 by ?FDA under an Emergency Use Authorization (EUA). This EUA will remain  ?in effect (meaning this test can be used) for the duration of the ?COVID-19 declaration under  Section 564(b)(1) of the Act, 21 ?U.S.C.section 360bbb-3(b)(1), unless the authorization is terminated  ?or revoked sooner.  ? ? ?  ? Influenza A by PCR NEGATIVE NEGATIVE Final  ? Influenza B by PCR NEGATIVE NEGATIVE

## 2021-08-01 NOTE — Assessment & Plan Note (Addendum)
Unsure of etiology. Previous baseline of 11-12. Hemoglobin of 9.3 on admission. No obvious evidence of bleeding. Recent right shoulder washout which may be etiology. Iron panel suggests possible iron deficiency, but not clear cut. Recent colonoscopy in 2020 without mention of diverticulosis/hemorrhoids. Reticulocyte count is abnormally normal. INR slightly elevated. Hemoglobin stable. ?

## 2021-08-02 DIAGNOSIS — J9601 Acute respiratory failure with hypoxia: Secondary | ICD-10-CM | POA: Diagnosis not present

## 2021-08-02 DIAGNOSIS — M545 Low back pain, unspecified: Secondary | ICD-10-CM | POA: Diagnosis not present

## 2021-08-02 DIAGNOSIS — J8281 Chronic eosinophilic pneumonia: Secondary | ICD-10-CM | POA: Diagnosis not present

## 2021-08-02 DIAGNOSIS — D649 Anemia, unspecified: Secondary | ICD-10-CM | POA: Diagnosis not present

## 2021-08-02 LAB — CBC WITH DIFFERENTIAL/PLATELET
Abs Immature Granulocytes: 0.09 10*3/uL — ABNORMAL HIGH (ref 0.00–0.07)
Basophils Absolute: 0 10*3/uL (ref 0.0–0.1)
Basophils Relative: 0 %
Eosinophils Absolute: 0.1 10*3/uL (ref 0.0–0.5)
Eosinophils Relative: 2 %
HCT: 28.8 % — ABNORMAL LOW (ref 36.0–46.0)
Hemoglobin: 9.1 g/dL — ABNORMAL LOW (ref 12.0–15.0)
Immature Granulocytes: 1 %
Lymphocytes Relative: 9 %
Lymphs Abs: 0.7 10*3/uL (ref 0.7–4.0)
MCH: 29.4 pg (ref 26.0–34.0)
MCHC: 31.6 g/dL (ref 30.0–36.0)
MCV: 93.2 fL (ref 80.0–100.0)
Monocytes Absolute: 0.1 10*3/uL (ref 0.1–1.0)
Monocytes Relative: 1 %
Neutro Abs: 7.2 10*3/uL (ref 1.7–7.7)
Neutrophils Relative %: 87 %
Platelets: 335 10*3/uL (ref 150–400)
RBC: 3.09 MIL/uL — ABNORMAL LOW (ref 3.87–5.11)
RDW: 14 % (ref 11.5–15.5)
WBC: 8.3 10*3/uL (ref 4.0–10.5)
nRBC: 0 % (ref 0.0–0.2)

## 2021-08-02 LAB — AEROBIC/ANAEROBIC CULTURE W GRAM STAIN (SURGICAL/DEEP WOUND)
Culture: NO GROWTH
Culture: NO GROWTH
Culture: NO GROWTH
Culture: NO GROWTH
Culture: NO GROWTH
Gram Stain: NONE SEEN
Gram Stain: NONE SEEN
Gram Stain: NONE SEEN
Gram Stain: NONE SEEN
Gram Stain: NONE SEEN
Gram Stain: NONE SEEN

## 2021-08-02 LAB — RESPIRATORY PANEL BY PCR

## 2021-08-02 LAB — LEGIONELLA PNEUMOPHILA SEROGP 1 UR AG: L. pneumophila Serogp 1 Ur Ag: NEGATIVE

## 2021-08-02 LAB — URINE CULTURE: Culture: NO GROWTH

## 2021-08-02 LAB — OCCULT BLOOD X 1 CARD TO LAB, STOOL: Fecal Occult Bld: NEGATIVE

## 2021-08-02 MED ORDER — SODIUM CHLORIDE 0.9 % IV SOLN
2.0000 g | INTRAVENOUS | Status: DC
  Administered 2021-08-02 – 2021-08-03 (×2): 2 g via INTRAVENOUS
  Filled 2021-08-02 (×2): qty 20

## 2021-08-02 MED ORDER — SODIUM CHLORIDE 0.9% FLUSH: 10.0000 mL | INTRAVENOUS | Status: DC | PRN

## 2021-08-02 NOTE — Progress Notes (Signed)
Received report from Jerrye Noble, RN.  Assessment unchanged. ?Chancy Hurter B ? ?

## 2021-08-02 NOTE — Progress Notes (Signed)
? ?[facility] Infectious Diseases Follow Up Note ? ?Patient Identification: ?Patient Name: Natalie [name] MRN: [id number] [place] Date: 07/31/2021  6:45 PM ?Age: 76 y.o.Today's Date: 08/02/2021 ? ?Reason for Visit: PJI/dapto induced eosinop[hilic PNA ? ?Principal Problem: ?  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  Hyperlipidemia ?  Hypertension ?  Multifocal pneumonia ?  Acute respiratory failure with hypoxia ([place]) ?  Acute low back pain ?  Acute anemia ? ? ?Antibiotics: ?Daptomycin/ceftriaxone prior to admit  ?Cefepime 4/11-c ?Doxycycline 4/11 ? ?Lines/Hardwares: Left PICC  ? ?Interval Events: continues to be afebrile, Back pain has significantly improved  ? ?Assessment ?Acute Hypoxemic Respiratory Failure 2/2 Possible daptomycin induced eosinophilic pna - Daptomycin stopped and started on methyprednisolone on 4/12. Pulm consulted. ? ?2.  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 ?3. Low back pain - has significantly improved on exam today  ? ?Recommendations ?Continue doxycycline. Switch cefepime to ceftriaxone. Low suspicion for PsA infection.  ?Pending RV panel,  Legionella pneumonia ag and sputum cultures  ?[facility] recs for steroid management for possible Daptomycin induced eosinophilic pna ?Dr Candiss Norse will follow from tomorrow.  ? ?Rest of the management as per the primary team. ?Thank you for the consult. Please page with pertinent questions or concerns. ? ?______________________________________________________________________ ?Subjective ?patient seen and examined at the bedside. Low back pain has significantly improved ? ?Vitals ?BP 107/79 (BP Location: Left Wrist)   Pulse 69   Temp 98.2 ?F (36.8 ?C) (Oral)   Resp 18   Ht '5\' 2"'$  (1.575 m)   Wt 68 kg   SpO2 95%   BMI 27.44 kg/m?  ? ?  ?Physical  Exam ?Constitutional:  not in acute distress and appears comfortable ?   Comments:  ? ?Cardiovascular:  ?   Rate and Rhythm: Normal rate and regular rhythm.  ?   Heart sounds: ? ?Pulmonary:  ?   Effort: Pulmonary effort is normal on [place] ?   Comments: rales + ? ?Abdominal:  ?   Palpations: Abdomen is soft.  ?   Tenderness: non distended and non tender  ? ?Musculoskeletal:     ?   General: No swelling or tenderness. Rt shoulder on arm sling and able to squeeze from rt hand. Np vertebral tenderness ? ?Skin: ?   Comments: No obvious lesions or rashes  ? ?Neurological:  ?   General: No focal deficit present. Awake, alert and oriented  ? ?Psychiatric:     ?   Mood and Affect: Mood normal.  ? ?Pertinent Microbiology ?Results for orders placed or performed during the hospital encounter of 07/31/21  ?Urine Culture     Status: None  ? Collection Time: 07/31/21  8:09 PM  ? Specimen: In/Out Cath Urine  ?Result Value Ref Range Status  ? Specimen Description   Final  ?  IN/OUT CATH URINE ?Performed at [facility], [place] [address], [place], [place] [address] ?  ? Special Requests   Final  ?  NONE ?Performed at [facility], [place] [address] [place], [place] [address] ?  ? Culture   Final  ?  NO GROWTH ?Performed at [facility], [place] [address], [place], [place] [address] ?  ? Report Status 08/02/2021 FINAL  Final  ?Blood Culture (routine x 2)     Status: None (Preliminary result)  ?  Collection Time: 07/31/21  9:05 PM  ? Specimen: BLOOD  ?Result Value Ref Range Status  ? Specimen Description   Final  ?  BLOOD PICC LINE ?Performed at [facility], [place] [address], [place], [place] [address] ?  ? Special Requests   Final  ?  BOTTLES DRAWN AEROBIC AND ANAEROBIC Blood Culture results may not be optimal due to an inadequate volume of blood received in culture bottles ?Performed at [facility], [place] [address] [place], [place] [address] ?  ? Culture   Final   ?  NO GROWTH 2 DAYS ?Performed at [facility], [place] [address], [place], [place] [address] ?  ? Report Status PENDING  Incomplete  ?Blood Culture (routine x 2)     Status: None (Preliminary result)  ? Collection Time: 07/31/21  9:05 PM  ? Specimen: BLOOD  ?Result Value Ref Range Status  ? Specimen Description   Final  ?  BLOOD LEFT ARM ?Performed at [facility], [place] [address], [place], [place] [address] ?  ? Special Requests   Final  ?  BOTTLES DRAWN AEROBIC AND ANAEROBIC Blood Culture results may not be optimal due to an inadequate volume of blood received in culture bottles ?Performed at [facility], [place] [address], [place], [place] [address] ?  ? Culture   Final  ?  NO GROWTH 2 DAYS ?Performed at [facility], [place] [address] [place], [place] [address] ?  ? Report Status PENDING  Incomplete  ?Resp Panel by RT-PCR (Flu A&B, Covid) Nasopharyngeal Swab     Status: None  ? Collection Time: 08/01/21  7:42 AM  ? Specimen: Nasopharyngeal Swab; Nasopharyngeal(NP) swabs in vial transport medium  ?Result Value Ref Range Status  ? SARS Coronavirus 2 by RT PCR NEGATIVE NEGATIVE Final  ?  Comment: (NOTE) ?SARS-CoV-2 target nucleic acids are NOT DETECTED. ? ?The SARS-CoV-2 RNA is generally detectable in upper respiratory ?specimens during the acute phase of infection. The lowest ?concentration of SARS-CoV-2 viral copies this assay can detect is ?138 copies/mL. A negative result does not preclude SARS-Cov-2 ?infection and should not be used as the sole basis for treatment or ?other patient management decisions. A negative result may occur with  ?improper specimen collection/handling, submission of specimen other ?than nasopharyngeal swab, presence of viral mutation(s) within the ?areas targeted by this assay, and inadequate number of viral ?copies(<138 copies/mL). A negative result must be combined with ?clinical observations, patient history, and  epidemiological ?information. The expected result is Negative. ? ?Fact Sheet for Patients:  ?EntrepreneurPulse.com.au ? ?Fact Sheet for Healthcare Providers:  ?IncredibleEmployment.be ? ?This test is no t yet approved or cleared by the [place] FDA and  ?has been authorized for detection and/or diagnosis of SARS-CoV-2 by ?FDA under an Emergency Use Authorization (EUA). This EUA will remain  ?in effect (meaning this test can be used) for the duration of the ?COVID-19 declaration under Section 564(b)(1) of the Act, 21 ?U.S.C.section 360bbb-3(b)(1), unless the authorization is terminated  ?or revoked sooner.  ? ? ?  ? Influenza A by PCR NEGATIVE NEGATIVE Final  ? Influenza B by PCR NEGATIVE NEGATIVE Final  ?  Comment: (NOTE) ?The Xpert Xpress SARS-CoV-2/FLU/RSV plus assay is intended as an aid ?in the diagnosis of influenza from Nasopharyngeal swab specimens and ?should not be used as a sole basis for treatment. Nasal washings and ?aspirates are unacceptable for Xpert Xpress SARS-CoV-2/FLU/RSV ?testing. ? ?Fact Sheet for Patients: ?  EntrepreneurPulse.com.au ? ?Fact Sheet for Healthcare Providers: ?IncredibleEmployment.be ? ?This test is not yet approved or cleared by the [place] FDA and ?has been authorized for detection and/or diagnosis of SARS-CoV-2 by ?FDA under an Emergency Use Authorization (EUA). This EUA will remain ?in effect (meaning this test can be used) for the duration of the ?COVID-19 declaration under Section 564(b)(1) of the Act, 21 U.S.C. ?section 360bbb-3(b)(1), unless the authorization is terminated or ?revoked. ? ?Performed at [facility], [place] [address] ?[place], [place] [address] ?  ? ?Pertinent Lab. ? ?  Latest Ref Rng & Units 08/02/2021  ?  4:07 AM 08/01/2021  ?  4:43 AM 07/31/2021  ?  9:55 PM  ?CBC  ?WBC 4.0 - 10.5 K/uL 8.3   10.1     ?Hemoglobin 12.0 - 15.0 g/dL 9.1   8.8   9.9    ?Hematocrit 36.0  - 46.0 % 28.8   27.4   29.0    ?Platelets 150 - 400 K/uL 335   299     ? ? ?  Latest Ref Rng & Units 08/01/2021  ?  4:43 AM 07/31/2021  ?  9:55 PM 07/31/2021  ?  9:05 PM  ?CMP  ?Glucose 70 - 99 mg/dL 102   107   106    ?BUN 8 - 23 mg/dL '19   11   14    '$ ?Cr

## 2021-08-02 NOTE — Consult Note (Signed)
? ?NAME:  Natalie [name], MRN:  [id number], DOB:  11-18-1945, LOS: 2 ?ADMISSION DATE:  07/31/2021, CONSULTATION DATE:  4/13 ?REFERRING MD:  Dr. Ellin Goodie, CHIEF COMPLAINT:  Eosinophilic pneumonia    ? ?History of Present Illness:  ?Natalie [name] is a 76 y.o. female with a PMH significant for septic arthritis  s/p joint washout after right shoulder rotator cuff repair which was then treated with daptomycin and ceftriaxone, HTN, HLD, skin cancer, GERD, and arthritis how presented 4/11 for subjective fever, chills, and productive cough. She was admitted per [facility] and ID was consulted given history of septic arthritis and new concern for multifocal pneumonia on admit.  ? 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 PCCM consulted for assistance in pulmonary management.  ? ?Pertinent  Medical History  ?Septic arthritis  s/p joint washout after right shoulder rotator cuff repair which was then treated with daptomycin and ceftriaxone, HTN, HLD, skin cancer, GERD, and arthritis ? ?Significant Hospital Events: ?Including procedures, antibiotic start and stop dates in addition to other pertinent events   ?4/11  admitted per [facility] and ID was consulted given history of septic arthritis and new concern for daptomycin induced eosinophilic pneumonia ?4/13 No acute issues overnight ? ?Interim History / Subjective:  ?States she feels much better this am  ?Natalie [name] productive cough  ? ?Objective   ?Blood pressure 107/79, pulse 69, temperature 98.2 ?F (36.8 ?C), temperature source Oral, resp. rate 18, height '5\' 2"'  (1.575 m), weight 68 kg, SpO2 95 %. ?   ?   ? ?Intake/Output Summary (Last 24 hours) at 08/02/2021 0948 ?Last data filed at 08/01/2021 1022 ?Gross per 24 hour  ?Intake 100 ml  ?Output --  ?Net 100 ml  ? ?Filed Weights  ? 07/31/21 1855  ?Weight: 68 kg  ? ? ?Examination: ?General: Very pleasant well appearing elderly  female lying in bed in NAD ?HEENT: [place]/AT, MM pink/moist, PERRL,  ?Neuro: Alert and oriented x3, non-focal  ?CV: s1s2 regular rate and rhythm, no murmur, rubs, or gallops,  ?PULM:  Clear to ascultation, no increased work of breathing, on 1L [place],  ?GI: soft, bowel sounds active in all 4 quadrants, non-tender, non-distended, tolerating oral diet ?Extremities: warm/dry, right upper extremity in sling   ?Skin: no rashes or lesions ? ?Resolved Hospital Problem list   ? ? 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 ?Best Practice (right click and "Reselect all SmartList Selections" daily)  ?Per primary  ? ?Labs   ?CBC: ?Recent Labs  ?Lab 07/31/21 ?2105 07/31/21 ?2155 08/01/21 ?0443 08/02/21 ?0407  ?WBC 11.0*  --  10.1 8.3  ?NEUTROABS 8.1*  --  7.2 7.2  ?HGB 9.3* 9.9* 8.8* 9.1*  ?HCT 28.1* 29.0* 27.4* 28.8*  ?MCV 92.1  --  93.2 93.2  ?PLT 312  --  299 335  ? ? ?Basic Metabolic Panel: ?Recent Labs  ?Lab 07/31/21 ?2105 07/31/21 ?2155 08/01/21 ?0443  ?NA 135 135 137  ?  K 3.2* 3.3* 3.7  ?CL 100 99 104  ?CO2 25  --  24  ?GLUCOSE 106* 107* 102*  ?BUN '14 11 19  ' ?CREATININE 0.78 0.70 0.88  ?CALCIUM 9.2  --  8.9  ?MG 1.7  --  1.8  ?PHOS  --   --  3.9  ? ?GFR: ?Estimated Creatinine Clearance: 48.4 mL/min (by C-G formula based on SCr of 0.88 mg/dL). ?Recent Labs  ?Lab 07/31/21 ?2105 08/01/21 ?0443 08/02/21 ?0407  ?PROCALCITON <0.10  --   --   ?WBC 11.0* 10.1 8.3  ?LATICACIDVEN 0.8  --   --   ? ? ?Liver Function Tests: ?Recent Labs  ?Lab  07/31/21 ?2105 08/01/21 ?0443  ?AST 13* 10*  ?ALT 15 14  ?ALKPHOS 68 59  ?BILITOT 1.0 0.3  ?PROT 7.0 6.5  ?ALBUMIN 3.6 3.3*  ? ?No results for input(s): LIPASE, AMYLASE in the last 168 hours. ?No results for input(s): AMMONIA in the last 168 hours. ? ?ABG ?   ?Component Value Date/Time  ? TCO2 24 07/31/2021 2155  ?  ? ?Coagulation Profile: ?Recent Labs  ?Lab 07/31/21 ?2105  ?INR 1.2  ? ? ?Cardiac Enzymes: ?Recent Labs  ?Lab 08/01/21 ?7471  ?CKTOTAL 74  ? ? ?HbA1C: ?No results found for: HGBA1C ? ?CBG: ?No results for input(s): GLUCAP in the last 168 hours. ? ?Review of Systems:   ?Please see the history of present illness. All other systems reviewed and are negative  ? ?Past Medical History:  ?She,  has a past medical history of Arthritis, Back pain, Cancer ([place]), Change in hearing, Generalized headaches, GERD (gastroesophageal reflux disease), Heart murmur, Hyperlipidemia, Hypertension, Joint pain, Leg pain, Osteopenia (04/2015), PONV (postoperative nausea and vomiting), Skin cancer, and Varicose veins of leg with pain (Left  > Right).  ? ?Surgical History:  ? ?Past Surgical History:  ?Procedure Laterality Date  ? ABDOMINAL HYSTERECTOMY  1982  ? TAH BSO  ? arm surgery  1998 right arm  ? CESAREAN SECTION    ? ECTOPIC PREGNANCY SURGERY    ? ENDOVENOUS ABLATION SAPHENOUS VEIN W/ LASER  left leg 10 years ago per pt.  ? EXCISIONAL TOTAL SHOULDER ARTHROPLASTY WITH ANTIBIOTIC SPACER Right 07/12/2021  ? Procedure: Removal Right Reverse shoulder arthroplasty and placement of antibiotic spacer;  Surgeon: Justice Britain, MD;  Location: [facility];  Service: Orthopedics;  Laterality: Right;  137mn  ? EXPLORATORY TYMPANOTOMY    ? FINGER SURGERY  2007  right hand 4th and 5th digits  ? REVERSE SHOULDER ARTHROPLASTY Right 09/07/2020  ? Procedure: REVERSE SHOULDER ARTHROPLASTY;  Surgeon: SJustice Britain MD;  Location: [facility];  Service: Orthopedics;  Laterality: Right;  1234m  ? SHOULDER SURGERY  2019  ? STAPEDECTOMY  2001  ? STAPEDECTOMY   revision 2002  ? TONSILLECTOMY  1954  ? VEIN LIGATION AND STRIPPING  9-12  ? LEFT- RIGHT LEG INJECTED FOR SPIDER VEINS  ?  ? ?Social History:  ? reports that she has never smoked. She has never used smokeless tobacco. She reports that she does not drink alcohol and does not use drugs.  ? ?Family History:  ?Her family history includes Diabetes in her daughter; Heart Problems in her daughter; Heart disease in her father and mother; Hypertension in her father and mother.  ? ?Allergies ?Allergies  ?Allergen Reactions  ? Daptomycin Other (See Comments)  ?  Eosinophilic pneumonia  ? Cephalexin   ?  Can not recall reaction  ? Nitrofurantoin Monohyd Macro   ?  Call not  recall reaction  ? Sulfa Antibiotics Nausea Only  ?  ? ?Home Medications  ?Prior to Admission medications   ?Medication Sig Start Date End Date Taking? Authorizing Provider  ?aspirin 81 MG tablet Take 81 mg by mouth daily.   Yes [provider]  ?atenolol-chlorthalidone (TENORETIC) 100-25 MG per tablet Take 0.5 tablets by mouth daily.   Yes [provider]  ?atorvastatin (LIPITOR) 20 MG tablet Take 20 mg by mouth at bedtime.   Yes [provider]  ?cefTRIAXone (ROCEPHIN) IVPB Inject 2 g into the vein daily. Indication: prosthetic joint infection ?First Dose: Yes ?Last Day of Therapy:  08/24/2021 ?Labs - Once weekly:  CBC/D, BMP, CRP, ESR ?Method of administration: IV Push ?Method of administration may be changed at the discretion of home infusion pharmacist based upon assessment of the patient and/or caregiver's ability to self-administer the medication ordered. 07/13/21 08/24/21 Yes Shuford, Olivia Mackie, PA-C  ?Cholecalciferol (VITAMIN D) 125 MCG (5000 UT) CAPS Take 5,000 Units by mouth daily.   Yes [provider]  ?hydroxypropyl methylcellulose / hypromellose (ISOPTO TEARS / GONIOVISC) 2.5 % ophthalmic solution Place 1 drop into both eyes as needed for dry eyes.   Yes [provider]  ?losartan (COZAAR) 50 MG tablet Take  50 mg by mouth daily. 06/10/18  Yes [provider]  ?meloxicam (MOBIC) 15 MG tablet Take 1 tablet (15 mg total) by mouth at bedtime. ?Patient taking differently: Take 15 mg by mouth daily. 09/07/20

## 2021-08-02 NOTE — TOC Initial Note (Signed)
Transition of Care (TOC) - Initial/Assessment Note  ? ? ?Patient Details  ?Name: Natalie [name] ?MRN: [id number] ?Date of Birth: Jan 21, 1946 ? ?Transition of Care [facility]) CM/SW Contact:    ?Dessa Phi, RN ?Phone Number: ?08/02/2021, 12:01 PM ? ?Clinical Narrative: Active w/[facility] HHRN/PT rep [facility] following.                ? ? ?  ?Barriers to Discharge: Continued Medical Work up ? ? ?Patient Goals and CMS Choice ?Patient states their goals for this hospitalization and ongoing recovery are:: Home ?CMS Medicare.gov Compare Post Acute Care list provided to:: Patient ?Choice offered to / list presented to : Patient ? ?Expected Discharge Plan and Services ?  ?  ?Discharge Planning Services: CM Consult ?  ?Living arrangements for the past 2 months: [place] ?Expected Discharge Date:  (unknown)               ?  ?  ?  ?  ?  ?  ?  ?  ?  ?  ? ?Prior Living Arrangements/Services ?Living arrangements for the past 2 months: [place] ?Lives with:: Spouse ?Patient language and need for interpreter reviewed:: Yes ?Do you feel safe going back to the place where you live?: Yes      ?Need for Family Participation in Patient Care: Yes (Comment) ?Care giver support system in place?: Yes (comment) ?Current home services: Home RN, Home PT (Active w/[facility] HHRN/PT rep Tommi Rumps) ?Criminal Activity/Legal Involvement Pertinent to Current Situation/Hospitalization: No - Comment as needed ? ?Activities of Daily Living ?Home Assistive Devices/Equipment: Eyeglasses, Brace (specify type), Other (Comment) (right arm sling, PICC line-left upper arm) ?ADL Screening (condition at time of admission) ?Patient's cognitive ability adequate to safely complete daily activities?: Yes ?Is the patient deaf or have difficulty hearing?: No ?Does the patient have difficulty seeing, even when wearing glasses/contacts?: No ?Does the patient have difficulty concentrating, remembering, or making decisions?: No ?Patient able to express need for  assistance with ADLs?: Yes ?Does the patient have difficulty dressing or bathing?: Yes (secondary to right shoulder issues-arm in a sling) ?Independently performs ADLs?: No ?Communication: Independent ?Dressing (OT): Needs assistance ?Is this a change from baseline?: Pre-admission baseline ?Grooming: Independent ?Feeding: Needs assistance ?Is this a change from baseline?: Pre-admission baseline ?Bathing: Needs assistance ?Is this a change from baseline?: Pre-admission baseline ?Toileting: Needs assistance ?Is this a change from baseline?: Pre-admission baseline ?In/Out Bed: Needs assistance ?Is this a change from baseline?: Pre-admission baseline ?Walks in Home: Needs assistance ?Is this a change from baseline?: Pre-admission baseline ?Does the patient have difficulty walking or climbing stairs?: Yes (secondary to right shoulder issues-arm in a sling) ?Weakness of Legs: None ?Weakness of Arms/Hands: Right ? ?Permission Sought/Granted ?Permission sought to share information with : Case Manager ?Permission granted to share information with : Yes, Verbal Permission Granted ? Share Information with NAME: Case Manager ?   ?   ?   ? ?Emotional Assessment ?Appearance:: Appears stated age ?Attitude/Demeanor/Rapport: Gracious ?Affect (typically observed): Accepting ?Orientation: : Oriented to Self, Oriented to Place, Oriented to  Time, Oriented to Situation ?Alcohol / Substance Use: Not Applicable ?  ? ?Admission diagnosis:  Hypoxia [R09.02] ?CAP (community acquired pneumonia) [J18.9] ?Multifocal pneumonia [J18.9] ?Patient Active Problem List  ? Diagnosis Date Noted  ? Acute respiratory failure with hypoxia ([place]) 08/01/2021  ? Acute low back pain 08/01/2021  ? Acute anemia 08/01/2021  ? 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 Multifocal pneumonia 07/31/2021  ? Aftercare following explantation  of shoulder joint prosthesis 07/12/2021  ? History of total abdominal hysterectomy and bilateral salpingo-oophorectomy 02/22/2020   ? Upper airway cough syndrome 02/12/2017  ? Breast mass, right 08/07/2011  ? Osteopenia   ? Hyperlipidemia   ? Arthritis   ? Heart murmur   ? Hypertension   ? ?PCP:  Prince Solian, MD ?Pharmacy:   ?[place] ?[place] [address] ?Phone: [phone] Fax: [phone] ? ?[place] ?[place] [address] ?Phone: [phone] Fax: [phone] ? ?[facility] ?[address] ?[place] [address] ?Phone: [phone] Fax: [phone] ? ? ? ? ?Social Determinants of Health (SDOH) Interventions ?  ? ?Readmission Risk Interventions ? ?  07/13/2021  ?  1:58 PM  ?Readmission Risk Prevention Plan  ?Post Dischage Appt Complete  ?Medication Screening Complete  ?Transportation Screening Complete  ? ? ? ?

## 2021-08-02 NOTE — Progress Notes (Signed)
? ?PROGRESS NOTE ? ? ? ?Natalie [name]  KCL:[id number] DOB: [birth year] DOA: 07/31/2021 ?PCP: Prince Solian, MD ? ? ?Brief Narrative: ?Natalie [name] is a 76 y.o. female with a history of septic arthritis status post washout on daptomycin and ceftriaxone, hypertension, hyperlipidemia.  Patient presents secondary to subjective fevers.  Imaging on admission concerning for multifocal pneumonia.  Infectious disease consulted and initially recommended continuation of daptomycin and transition from ceftriaxone to cefepime.  New recommendations concerning for eosinophilic pneumonia and daptomycin was discontinued.  Recommendations to start on steroids.  Recommendation for pulmonology consult. ? ? 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 Daptomycin discontinued. ?-Pulmonology consulted ?-Solu-medrol 80 mg IV daily per pulmonology initial recommendations ? ?Acute anemia ?Unsure of etiology. Previous baseline of 11-12. Hemoglobin of 9.3 on admission. No obvious evidence of bleeding. Recent right shoulder washout which may be etiology. Iron panel suggests possible iron deficiency, but not clear cut. Recent colonoscopy in 2020 without mention of diverticulosis/hemorrhoids. Reticulocyte count is abnormally normal. INR slightly elevated. Hemoglobin stable. ?-FOBT ? ?Acute low back pain ?No midline tenderness. Patient's concern is this could be positional. No lower extremity weakness. Improved. ?-Analgesics prn for now ? ?Acute respiratory failure with hypoxia ([place]) ?Hypoxia with SpO2 of 89% and cough. In setting of multifocal pneumonia. ?-Wean to room air as able ?-Ambulatory pulse ox check prior to discharge ? ?Multifocal pneumonia ?In setting of recent diagnosis of septic arthritis s/p right shoulder washout, started on daptomycin and ceftriaxone. CT imaging concerning for multifocal pneumonia. Initial recommendations to continue Daptomycin and  transition Ceftriaxone to Cefepime. Blood cultures obtained on admission. Infectious disease concerned for eosinophilic pneumonia secondary to daptomycin. ?-See problem, Eosinophilic pneumonia ? ?Hypertension ?Patient is on atenolol, chlorthalidone and losartan. Home regimen held on admission secondary to acute infection and concern for development of unstable hemodynamics. Blood pressure uncontrolled. ?-Continue home atenolol ?-Hold losartan and chlorthalidone for now ? ?Hyperlipidemia ?Patient is on Lipitor as an outpatient which was held secondary to Daptomycin use. ? ?Hypokalemia-resolved as of 08/01/2021 ?Resolved with repletion. ? ? ? ? ?DVT prophylaxis: SCDs ?Code Status:   Code Status: Full Code ?Family Communication: None at bedside. Declined for me to call family ?Disposition Plan: Discharge home likely in 1-3 days pending continued management of pneumonia, outpatient antibiotic regimen, pulmonology/ID recommendations ? ? ?Consultants:  ?Infectious disease ?Pulmonology ? ?Procedures:  ?None ? ?Antimicrobials: ?Daptomycin ?Cefepime ?Doxycycline  ? ? ?Subjective: ?Back pain is improved today. Dyspnea is also improved. ? ?Objective: ?BP 107/79 (BP Location: Left Wrist)   Pulse 69   Temp 98.2 ?F (36.8 ?C) (Oral)   Resp 18   Ht '5\' 2"'$  (1.575 m)   Wt 68 kg   SpO2 95%   BMI 27.44 kg/m?  ? ?Examination: ? ?General exam: Appears calm and comfortable ?Respiratory system: Diffuse rales, worse at bases. Respiratory effort normal. ?Cardiovascular system: S1 & S2 heard, RRR. No murmurs, rubs, gallops or clicks. ?Gastrointestinal system: Abdomen is nondistended, soft and nontender. No organomegaly or masses felt. Normal bowel sounds heard. ?Central nervous system: Alert and oriented. No focal neurological deficits. Right arm in sling. ?Musculoskeletal: No edema. No calf tenderness ?Skin: No cyanosis. No rashes ?Psychiatry: Judgement and insight appear normal. Mood & affect appropriate.  ? ? ?Data Reviewed: I have  personally reviewed following labs and imaging studies ? ?CBC ?Lab Results  ?Component Value Date  ? WBC 8.3 08/02/2021  ? RBC 3.09 (L) 08/02/2021  ? HGB 9.1 (L) 08/02/2021  ?  HCT 28.8 (L) 08/02/2021  ? MCV 93.2 08/02/2021  ? MCH 29.4 08/02/2021  ? PLT 335 08/02/2021  ? MCHC 31.6 08/02/2021  ? RDW 14.0 08/02/2021  ? LYMPHSABS 0.7 08/02/2021  ? MONOABS 0.1 08/02/2021  ? EOSABS 0.1 08/02/2021  ? BASOSABS 0.0 08/02/2021  ? ? ? ?Last metabolic panel ?Lab Results  ?Component Value Date  ? NA 137 08/01/2021  ? K 3.7 08/01/2021  ? CL 104 08/01/2021  ? CO2 24 08/01/2021  ? BUN 19 08/01/2021  ? CREATININE 0.88 08/01/2021  ? GLUCOSE 102 (H) 08/01/2021  ? GFRNONAA >60 08/01/2021  ? CALCIUM 8.9 08/01/2021  ? PHOS 3.9 08/01/2021  ? PROT 6.5 08/01/2021  ? ALBUMIN 3.3 (L) 08/01/2021  ? BILITOT 0.3 08/01/2021  ? ALKPHOS 59 08/01/2021  ? AST 10 (L) 08/01/2021  ? ALT 14 08/01/2021  ? ANIONGAP 9 08/01/2021  ? ? ?GFR: ?Estimated Creatinine Clearance: 48.4 mL/min (by C-G formula based on SCr of 0.88 mg/dL). ? ?Recent Results (from the past 240 hour(s))  ?Urine Culture     Status: None  ? Collection Time: 07/31/21  8:09 PM  ? Specimen: In/Out Cath Urine  ?Result Value Ref Range Status  ? Specimen Description   Final  ?  IN/OUT CATH URINE ?Performed at [facility], [place] [address] [place], [place] [address] ?  ? Special Requests   Final  ?  NONE ?Performed at [facility], [place] [address], [place], [place] [address] ?  ? Culture   Final  ?  NO GROWTH ?Performed at [facility], [place] [address], [place], [place] [address] ?  ? Report Status 08/02/2021 FINAL  Final  ?Blood Culture (routine x 2)     Status: None (Preliminary result)  ? Collection Time: 07/31/21  9:05 PM  ? Specimen: BLOOD  ?Result Value Ref Range Status  ? Specimen Description   Final  ?  BLOOD PICC LINE ?Performed at [facility], [place] [address] [place], [place] [address] ?  ? Special Requests   Final  ?   BOTTLES DRAWN AEROBIC AND ANAEROBIC Blood Culture results may not be optimal due to an inadequate volume of blood received in culture bottles ?Performed at [facility], [place] [address] [place], [place] [address] ?  ? Culture   Final  ?  NO GROWTH 2 DAYS ?Performed at [facility], [place] [address], [place], [place] [address] ?  ? Report Status PENDING  Incomplete  ?Blood Culture (routine x 2)     Status: None (Preliminary result)  ? Collection Time: 07/31/21  9:05 PM  ? Specimen: BLOOD  ?Result Value Ref Range Status  ? Specimen Description   Final  ?  BLOOD LEFT ARM ?Performed at [facility], [place] [address], [place], [place] [address] ?  ? Special Requests   Final  ?  BOTTLES DRAWN AEROBIC AND ANAEROBIC Blood Culture results may not be optimal due to an inadequate volume of blood received in culture bottles ?Performed at [facility], [place] [address], [place],  [address] ?  ? Culture   Final  ?  NO GROWTH 2 DAYS ?Performed at [facility], [place] [address], [place],  [address] ?  ? Report Status PENDING  Incomplete  ?Resp Panel by RT-PCR (Flu A&B, Covid) Nasopharyngeal Swab     Status: None  ? Collection Time: 08/01/21  7:42 AM  ? Specimen: Nasopharyngeal Swab;  Nasopharyngeal(NP) swabs in vial transport medium  ?Result Value Ref Range Status  ? SARS Coronavirus 2 by RT PCR NEGATIVE NEGATIVE Final  ?  Comment: (NOTE) ?SARS-CoV-2 target nucleic acids are NOT DETECTED. ? ?The SARS-CoV-2 RNA is generally detectable in upper respiratory ?specimens during the acute phase of infection. The lowest ?concentration of SARS-CoV-2 viral copies this assay can detect is ?138 copies/mL. A negative result does not preclude SARS-Cov-2 ?infection and should not be used as the sole basis for treatment or ?other patient management decisions. A negative result may occur with  ?improper specimen collection/handling, submission of specimen other ?than  nasopharyngeal swab, presence of viral mutation(s) within the ?areas targeted by this assay, and inadequate number of viral ?copies(<138 copies/mL). A negative result must be combined with ?clinical observations, patient history, an

## 2021-08-03 ENCOUNTER — Telehealth: Payer: Self-pay | Admitting: Acute Care

## 2021-08-03 ENCOUNTER — Telehealth: Payer: Self-pay | Admitting: Student

## 2021-08-03 DIAGNOSIS — J8281 Chronic eosinophilic pneumonia: Secondary | ICD-10-CM | POA: Diagnosis not present

## 2021-08-03 MED ORDER — PREDNISONE 10 MG PO TABS
ORAL_TABLET | ORAL | 0 refills | Status: AC
Start: 2021-08-04 — End: 2021-08-24

## 2021-08-03 MED ORDER — PANTOPRAZOLE SODIUM 40 MG PO TBEC
40.0000 mg | DELAYED_RELEASE_TABLET | Freq: Every day | ORAL | Status: DC
  Administered 2021-08-03: 40 mg via ORAL
  Filled 2021-08-03: qty 1

## 2021-08-03 MED ORDER — DOXYCYCLINE MONOHYDRATE 100 MG PO CAPS: 100.0000 mg | ORAL_CAPSULE | Freq: Two times a day (BID) | ORAL | 0 refills | Status: AC

## 2021-08-03 MED ORDER — PRAMIPEXOLE DIHYDROCHLORIDE 0.25 MG PO TABS
0.5000 mg | ORAL_TABLET | Freq: Every day | ORAL | Status: DC
  Filled 2021-08-03: qty 2

## 2021-08-03 MED ORDER — CEFTRIAXONE IV (FOR PTA / DISCHARGE USE ONLY): 2.0000 g | INTRAVENOUS | 0 refills | Status: AC

## 2021-08-03 MED ORDER — OXYCODONE-ACETAMINOPHEN 5-325 MG PO TABS
1.0000 | ORAL_TABLET | ORAL | Status: DC | PRN
  Administered 2021-08-03: 1 via ORAL
  Filled 2021-08-03: qty 1

## 2021-08-03 NOTE — Telephone Encounter (Signed)
See other encounter 08/03/21.  ?

## 2021-08-03 NOTE — Progress Notes (Signed)
?   ? ? ? ? ?[facility] for Infectious Disease ? ?Date of Admission:  07/31/2021   Total days of inpatient antibiotics 3 ? ?Principal Problem: ?  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  Hyperlipidemia ?  Hypertension ?  Multifocal pneumonia ?  Acute respiratory failure with hypoxia ([place]) ?  Acute low back pain ?  Acute anemia ?     ?    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 Pt was on daptomycin and ceftriaxone for PJI (EOT5/5/23). Dapto->doxy during admission.  ?-CT showed b/l ground glass opacities, peripheral eosinophilia on cbc and respiratory symptoms(cough) improved after stopping daptomycin: this is consistent with daptomycin induced eosinophilic PNA.  ?Recommendations: ?-Continue doxycyline and ceftriaxone(EOT 08/24/21) ?-Follow-up with ID, Dr. Baxter Flattery ? ? ?OPAT ORDERS: ? ?Diagnosis: PJI ? ?Culture Result: NG ? ?Allergies  ?Allergen Reactions  ? Daptomycin Other (See Comments)  ?  Eosinophilic pneumonia  ? Cephalexin   ?  Can not recall reaction  ? Nitrofurantoin Monohyd Macro   ?  Call not recall reaction  ? Sulfa Antibiotics Nausea Only  ? ? ? ?Discharge antibiotics to be given via PICC line: ? ?Per pharmacy protocol ceftriaxone ? ? ?Duration: ?6 weeks ?End Date: ?08/24/21 ? ?[facility] Care Per Protocol with Biopatch Use: ?Home health RN for IV administration and teaching, line care and labs.   ? ?Labs weekly while on IV antibiotics: ?__ CBC with differential ?__ CMP ?__ CRP ?__ ESR ? ? ?__ Please pull PIC at completion of IV antibiotics ? ? ?Fax weekly labs to [phone] ? ?Clinic Follow Up Appt: ? ?08/27/21 ?@ [facility] with Dr. Baxter Flattery  ? ?Microbiology:   ?Antibiotics: ?CTX EOT 08/24/21 ?Doxy 4/12-p ?Cefepime  4/11-4/13 ? ?Daptomycin prior to admission ?Cultures: ?Blood ?4/11 ng ?Urine ?4/11 ng ? ? ?SUBJECTIVE: ?Sitting in chair on RA ?Overnight: ? ?Review of Systems: ?Review of Systems  ?All other systems reviewed and are negative. ? ? ?Scheduled Meds: ? atenolol  50 mg Oral Daily  ? Chlorhexidine Gluconate Cloth  6 each Topical Daily  ? doxycycline  100 mg Oral Q12H  ? methylPREDNISolone (SOLU-MEDROL) injection  80 mg Intravenous Daily  ? pantoprazole  40 mg Oral Daily  ? pramipexole  0.5 mg Oral QHS  ? ?Continuous Infusions: ? cefTRIAXone (ROCEPHIN)  IV 2 g (08/02/21 1924)  ? ?PRN Meds:.acetaminophen **OR** acetaminophen, benzonatate, naLOXone (NARCAN)  injection, oxyCODONE-acetaminophen, sodium chloride flush ?Allergies  ?Allergen Reactions  ? Daptomycin Other (See Comments)  ?  Eosinophilic pneumonia  ? Cephalexin   ?  Can not recall reaction  ? Nitrofurantoin Monohyd Macro   ?  Call not recall reaction  ? Sulfa Antibiotics Nausea Only  ? ? ?OBJECTIVE: ?Vitals:  ? 08/03/21 0000 08/03/21 0129 08/03/21 0200 08/03/21 0300  ?BP:      ?Pulse:      ?Resp: (!) 32 13 (!) 22 (!) 9  ?Temp:      ?TempSrc:      ?SpO2:      ?Weight:      ?Height:      ? ?Body mass index is 27.44 kg/m?. ? ?Physical Exam ?Constitutional:   ?   Appearance: Normal appearance.  ?HENT:  ?   Head: Normocephalic and atraumatic.  ?  Right Ear: Tympanic membrane normal.  ?   Left Ear: Tympanic membrane normal.  ?   Nose: Nose normal.  ?   Mouth/Throat:  ?   Mouth: Mucous membranes are moist.  ?Eyes:  ?   Extraocular Movements: Extraocular movements intact.  ?   Conjunctiva/sclera: Conjunctivae normal.  ?   Pupils: Pupils are equal, round, and reactive to light.  ?Cardiovascular:  ?   Rate and Rhythm: Normal rate and regular rhythm.  ?   Heart sounds: No murmur heard. ?  No friction rub. No gallop.  ?Pulmonary:  ?   Effort: Pulmonary effort is normal.  ?   Breath sounds: Normal breath sounds.  ?Abdominal:  ?   General: Abdomen is flat.  ?   Palpations:  Abdomen is soft.  ?Musculoskeletal:     ?   General: Normal range of motion.  ?Skin: ?   General: Skin is warm and dry.  ?Neurological:  ?   General: No focal deficit present.  ?   Mental Status: She is alert and oriented to person, place, and time.  ?Psychiatric:     ?   Mood and Affect: Mood normal.  ? ? ? ? ?Lab Results ?Lab Results  ?Component Value Date  ? WBC 8.3 08/02/2021  ? HGB 9.1 (L) 08/02/2021  ? HCT 28.8 (L) 08/02/2021  ? MCV 93.2 08/02/2021  ? PLT 335 08/02/2021  ?  ?Lab Results  ?Component Value Date  ? CREATININE 0.88 08/01/2021  ? BUN 19 08/01/2021  ? NA 137 08/01/2021  ? K 3.7 08/01/2021  ? CL 104 08/01/2021  ? CO2 24 08/01/2021  ?  ?Lab Results  ?Component Value Date  ? ALT 14 08/01/2021  ? AST 10 (L) 08/01/2021  ? ALKPHOS 59 08/01/2021  ? BILITOT 0.3 08/01/2021  ?  ? ? ? ? ?Laurice Record, MD ?[facility] for Infectious Disease ?[facility] ?08/03/2021, 12:47 PM  ?

## 2021-08-03 NOTE — Progress Notes (Signed)
? ?  NAME:  Natalie [name], MRN:  [id number], DOB:  [birth year], LOS: 3 ?ADMISSION DATE:  07/31/2021, CONSULTATION DATE:  4/13 ?REFERRING MD:  Dr. Ellin Goodie, CHIEF COMPLAINT:  Eosinophilic pneumonia    ? ?History of Present Illness:  ?Natalie [name] is a 76 y.o. female with a PMH significant for septic arthritis  s/p joint washout after right shoulder rotator cuff repair which was then treated with daptomycin and ceftriaxone, HTN, HLD, skin cancer, GERD, and arthritis how presented 4/11 for subjective fever, chills, and productive cough. She was admitted per [facility] and ID was consulted given history of septic arthritis and new concern for multifocal pneumonia on admit.  ? 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 PCCM consulted for assistance in pulmonary management.  ? ?Pertinent  Medical History  ?Septic arthritis  s/p joint washout after right shoulder rotator cuff repair which was then treated with daptomycin and ceftriaxone, HTN, HLD, skin cancer, GERD, and arthritis ? ?Significant Hospital Events: ?Including procedures, antibiotic start and stop dates in addition to other pertinent events   ?4/11  admitted per [facility] and ID was consulted given history of septic arthritis and new concern for daptomycin induced eosinophilic pneumonia ?4/13 Started on IV steroids ? ?Interim History / Subjective:  ?Seen sitting up in bed with no acute complaints  ? ?Objective   ?Blood pressure (!) 142/69, pulse 80, temperature 97.9 ?F (36.6 ?C), temperature source Oral, resp. rate (!) 9, height '5\' 2"'$  (1.575 m), weight 68 kg, SpO2 90 %. ?   ?   ?No intake or output data in the 24 hours ending 08/03/21 1105 ? ?Filed Weights  ? 07/31/21 1855  ?Weight: 68 kg  ? ? ?Examination: ?General: Very pleasant elderly female lying in bed in NAD ?HEENT: [place]/AT, MM pink/moist, PERRL,  ?Neuro: Alert and oriented x3, non-focal  ?CV: s1s2  regular rate and rhythm, no murmur, rubs, or gallops,  ?PULM:  No increased work f breathing, on RA ?GI: soft, bowel sounds active in all 4 quadrants, non-tender, non-distended, tolerating oral diet  ?Extremities: warm/dry, no edema  ?Skin: no rashes or lesions ? ?Resolved Hospital Problem list   ? ? ?Assessment & Plan:  ?Acute hypoxic respiratory failure  ?High suspicion for for daptomycin induced eosinophilic pneumonia  ?-Eosinophils on admit CBC 1.3  ?-Unsure start date of Daptomycin  ?P: ?ID continues to follow, appreciate assistance  ?Cefepime stopped and Ceftriaxone resumed 4/13 ?Off supplemental oxygen  ?Continue IV steroids can likely wean over 2-3 weeks  ?Mobilize as able  ?Encourage pulmonary hygiene ?Will arrange for outpatient pulmonary follow up   ?Please see steroid taper per MD section  ? ?PCCM will be available as needed  ? ?Best Practice (right click and "Reselect all SmartList Selections" daily)  ?Per primary  ? ?Critical care time: NA   ?Nicolus Ose D. Harris, NP-C ?[facility] ?Personal contact information can be found on Amion  ?08/03/2021, 11:05 AM ? ? ? ? ? ? ?

## 2021-08-03 NOTE — Telephone Encounter (Signed)
Can we set up clinic follow up with me on 4/27? ? ?Thanks! ?Nate ?

## 2021-08-03 NOTE — Discharge Summary (Signed)
?Physician Discharge Summary ?  ?Patient: Natalie [name] MRN: [id number] DOB: 02/26/1946  ?Admit date:     07/31/2021  ?Discharge date: 08/03/21  ?Discharge Physician: Cordelia Poche, MD  ? ?PCP: Prince Solian, MD  ? ?Recommendations at discharge:  ? ?Outpatient follow-up with ID and Pulmonology ?Outpatient PCP follow-up ?Continue antibiotics for septic arthritis of right shoulder ?Prednisone taper ? ?Discharge Diagnoses: ?Principal Problem: ?  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  Hyperlipidemia ?  Hypertension ?  Multifocal pneumonia ?  Acute low back pain ?  Acute anemia ? ?Resolved Problems: ?  Acute respiratory failure with hypoxia ([place]) ?  Hypokalemia ? ?Hospital Course: ?Janica Eldred is a 76 y.o. female with a history of septic arthritis status post washout on daptomycin and ceftriaxone, hypertension, hyperlipidemia.  Patient presents secondary to subjective fevers.  Imaging on admission concerning for multifocal pneumonia.  Infectious disease consulted and initially recommended continuation of daptomycin and transition from ceftriaxone to cefepime.  New recommendations concerning for eosinophilic pneumonia and daptomycin was discontinued. Pulmonology consulted and steroids initiated. Patient with significant improvement in symptoms. ? 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 Likely etiology for multifocal pneumonia per ID. Daptomycin discontinued. Pulmonology consulted. Patient started on Solu-medrol 80 mg IV daily with recommendations for taper on discharge followed by outpatient follow-up with pulmonology. Prednisone 40 mg daily x5 days followed by taper of 30 mg daily x5 days, 20 mg daily x5 days and finally 10 mg daily x5 days. ? ?Acute anemia ?Unsure of etiology. Previous baseline of 11-12. Hemoglobin of 9.3 on admission. No obvious evidence of bleeding. Recent right shoulder washout which may be etiology. Iron panel suggests possible iron deficiency, but  not clear cut. Recent colonoscopy in 2020 without mention of diverticulosis/hemorrhoids. Reticulocyte count is abnormally normal. INR slightly elevated. Hemoglobin stable. ? ?Acute low back pain ?No midline tenderness. Patient's concern is this could be positional. No lower extremity weakness. Improved. ? ?Multifocal pneumonia ?In setting of recent diagnosis of septic arthritis s/p right shoulder washout, started on daptomycin and ceftriaxone. CT imaging concerning for multifocal pneumonia. Initial recommendations to continue Daptomycin and transition Ceftriaxone to Cefepime. Blood cultures obtained on admission. Infectious disease concerned for eosinophilic pneumonia secondary to daptomycin. ? ?Hypertension ?Patient is on atenolol, chlorthalidone and losartan. Home regimen held on admission secondary to acute infection and concern for development of unstable hemodynamics. Blood pressure uncontrolled. Resume home regimen on discharge. ? ?Hyperlipidemia ?Patient is on Lipitor as an outpatient which was held secondary to Daptomycin use. ? ?Hypokalemia-resolved as of 08/01/2021 ?Resolved with repletion. ? ? ?Acute respiratory failure with hypoxia ([facility])-resolved as of 08/03/2021 ?Hypoxia with SpO2 of 89% and cough. In setting of multifocal pneumonia. Weaned to room air. ? ? ? ? ?  ? ? ?Consultants: Infectious disease ?Procedures performed: None  ?Disposition: Home ?Diet recommendation:  ?Cardiac diet ? ?DISCHARGE MEDICATION: ?Allergies as of 08/03/2021   ? ?   Reactions  ? Daptomycin Other (See Comments)  ? Eosinophilic pneumonia  ? Cephalexin   ? Can not recall reaction  ? Nitrofurantoin Monohyd Macro   ? Call not recall reaction  ? Sulfa Antibiotics Nausea Only  ? ?  ? ?  ?Medication List  ?  ? ?STOP taking these medications   ? ?betamethasone valerate ointment 0.1 % ?Commonly known as: VALISONE ?  ?mupirocin ointment 2 % ?Commonly known as: BACTROBAN ?  ? ?  ? ?TAKE these medications   ? ?aspirin 81 MG tablet ?Take 81  mg by  mouth daily. ?  ?atenolol-chlorthalidone 100-25 MG tablet ?Commonly known as: TENORETIC ?Take 0.5 tablets by mouth daily. ?  ?atorvastatin 20 MG tablet ?Commonly known as: LIPITOR ?Take 20 mg by mouth at bedtime. ?  ?cefTRIAXone  IVPB ?Commonly known as: ROCEPHIN ?Inject 2 g into the vein daily for 21 days. Indication: prosthetic joint infection ?First Dose: Yes ?Last Day of Therapy:  08/24/2021 ?Labs - Once weekly:  CBC/D, BMP, CRP, ESR ?Method of administration: IV Push ?Method of administration may be changed at the discretion of home infusion pharmacist based upon assessment of the patient and/or caregiver's ability to self-administer the medication ordered. ?  ?doxycycline 100 MG capsule ?Commonly known as: MONODOX ?Take 1 capsule (100 mg total) by mouth 2 (two) times daily for 22 days. Stop date 08/24/21 ?  ?hydroxypropyl methylcellulose / hypromellose 2.5 % ophthalmic solution ?Commonly known as: ISOPTO TEARS / GONIOVISC ?Place 1 drop into both eyes as needed for dry eyes. ?  ?losartan 50 MG tablet ?Commonly known as: COZAAR ?Take 50 mg by mouth daily. ?  ?meloxicam 15 MG tablet ?Commonly known as: MOBIC ?Take 1 tablet (15 mg total) by mouth at bedtime. ?What changed: when to take this ?  ?methocarbamol 500 MG tablet ?Commonly known as: Robaxin ?Take 1 tablet (500 mg total) by mouth 4 (four) times daily. ?  ?omeprazole 20 MG capsule ?Commonly known as: PRILOSEC ?TAKE 1 CAPSULE (20 MG TOTAL) TWO TIMES DAILY. ?  ?ondansetron 4 MG tablet ?Commonly known as: Zofran ?Take 1 tablet (4 mg total) by mouth every 8 (eight) hours as needed for nausea or vomiting. ?  ?oxyCODONE-acetaminophen 5-325 MG tablet ?Commonly known as: Percocet ?Take 1 tablet by mouth every 4 (four) hours as needed (max 6 q). ?  ?pramipexole 0.5 MG tablet ?Commonly known as: MIRAPEX ?Take 0.5 mg by mouth at bedtime. ?  ?predniSONE 10 MG tablet ?Commonly known as: DELTASONE ?Take 4 tablets (40 mg total) by mouth daily with breakfast for 5 days,  THEN 3 tablets (30 mg total) daily with breakfast for 5 days, THEN 2 tablets (20 mg total) daily with breakfast for 5 days, THEN 1 tablet (10 mg total) daily with breakfast for 5 days. ?Start taking on: August 04, 2021 ?  ?Vitamin D 125 MCG (5000 UT) Caps ?Take 5,000 Units by mouth daily. ?  ? ?  ? ? Follow-up Information   ? ? Prince Solian, MD. Schedule an appointment as soon as possible for a visit in 1 week(s).   ?Specialty: Internal Medicine ?Why: For hospital follow-up ?Contact information: ?[address] ?[place] [address] ?[phone] ? ? ?  ?  ? ? Freddi Starr, MD. Schedule an appointment as soon as possible for a visit.   ?Specialty: Pulmonary Disease ?Why: For hospital follow-up ?Contact information: ?[address] ?[address] ?[place] [address] ?[phone] ? ? ?  ?  ? ? [facility] Follow up.   ?Why: Pam repinitial  iv infusion [phone] ? ?  ?  ? ? Care, [facility] Follow up.   ?Specialty: Home Health Services ?Why: HH nursing/physical therapy/occupational therapy ?Contact information: ?[address] ?[address] ?[place] [address] ?[phone] ? ? ?  ?  ? ?  ?  ? ?  ? ?Discharge Exam: ?Danley Danker Weights  ? 07/31/21 1855  ?Weight: 68 kg  ? ?General exam: Appears calm and comfortable ?Respiratory system: Some mild diffuse rales. Respiratory effort normal. ?Cardiovascular system: S1 & S2 heard, RRR. No murmurs, rubs, gallops  or clicks. ?Gastrointestinal system: Abdomen is nondistended, soft and nontender. No organomegaly or masses felt. Normal bowel sounds heard. ?Central nervous system: Alert and oriented. No focal neurological deficits. ?Musculoskeletal: No edema. No calf tenderness. Right arm in sling. ?Skin: No cyanosis. No rashes ?Psychiatry: Judgement and insight appear normal. Mood & affect appropriate.  ? ?Condition at discharge: stable ? ?The results of significant diagnostics from this hospitalization (including imaging, microbiology, ancillary and laboratory)  are listed below for reference.  ? ?Imaging Studies: ?CT Angio Chest Pulmonary Embolism (PE) W or WO Contrast ? ?Result Date: 07/31/2021 ?CLINICAL DATA:  Shortness of breath EXAM: CT ANGIOGRAPHY CHEST WIT

## 2021-08-03 NOTE — Telephone Encounter (Signed)
LMTCB and schedule HFU with Dr. Verlee Monte ?

## 2021-08-03 NOTE — Discharge Instructions (Signed)
Natalie [name], ? ?You are in the hospital with evidence of pneumonia.  This was likely related to your daptomycin.  Your daptomycin was discontinued and you have been started on steroids for treatment of this pneumonia.  Regarding your previous antibiotic therapy, doxycycline has been initiated in place of your daptomycin.  You will continue with ceftriaxone.  Please follow-up with Dr. Baxter Flattery as previously scheduled.  You also need to follow-up with the pulmonologist as an outpatient; they will call you for an appointment but I have included the information as well. ?

## 2021-08-03 NOTE — Progress Notes (Signed)
PHARMACY CONSULT NOTE FOR: ? ?OUTPATIENT  PARENTERAL ANTIBIOTIC THERAPY (OPAT) ? ?Indication: Prosthetic Right Shoulder Infection  ?Regimen: Ceftriaxone 2 gm IV Q 24 hours + Doxycycline PO 100 mg BID  ?End date: 08/24/21 ? ?IV antibiotic discharge orders are pended. ?To discharging provider:  please sign these orders via discharge navigator,  ?Select New Orders & click on the button choice - Manage This Unsigned Work.  ?  ? ?Thank you for allowing pharmacy to be a part of this patient's care. ? ?Jimmy Footman, PharmD, BCPS, BCIDP ?Infectious Diseases Clinical Pharmacist ?Phone: [phone] ?08/03/2021, 10:43 AM ? ?

## 2021-08-03 NOTE — TOC Transition Note (Signed)
Transition of Care (TOC) - CM/SW Discharge Note ? ? ?Patient Details  ?Name: Natalie [name] ?MRN: [id number] ?Date of Birth: Oct 21, 1945 ? ?Transition of [facility]) CM/SW Contact:  ?Dessa Phi, RN ?Phone Number: ?08/03/2021, 2:07 PM ? ? ?Clinical Narrative: Lenn Cal aware of HHRN/PT/OT-iv ablx:[facility] rep Pam aware of iv abx for home. No further CM needs.   ? ? ? ?Final next level of care: [place] ?Barriers to Discharge: No Barriers Identified ? ? ?Patient Goals and CMS Choice ?Patient states their goals for this hospitalization and ongoing recovery are:: Home ?CMS Medicare.gov Compare Post Acute Care list provided to:: Patient ?Choice offered to / list presented to : Patient ? ?Discharge Placement ?  ?           ?  ?  ?  ?  ? ?Discharge Plan and Services ?  ?Discharge Planning Services: CM Consult ?           ?  ?  ?  ?  ?  ?HH Arranged: IV Antibiotics ?[place] Agency: [facility], [place] ?Date HH Agency Contacted: 08/03/21 ?Time [place]: 1406 ?Representative spoke with at [place]: Lenn Cal HHRN/PT/OT;[facility] rep Pam-iv infusion,med ? ?Social Determinants of Health (SDOH) Interventions ?  ? ? ?Readmission Risk Interventions ? ?  07/13/2021  ?  1:58 PM  ?Readmission Risk Prevention Plan  ?Post Dischage Appt Complete  ?Medication Screening Complete  ?Transportation Screening Complete  ? ? ? ? ? ?

## 2021-08-04 LAB — METHYLMALONIC ACID, SERUM: Methylmalonic Acid, Quantitative: 466 nmol/L — ABNORMAL HIGH (ref 0–378)

## 2021-08-05 LAB — CULTURE, BLOOD (ROUTINE X 2)
Culture: NO GROWTH
Culture: NO GROWTH

## 2021-08-06 DIAGNOSIS — T8459XA Infection and inflammatory reaction due to other internal joint prosthesis, initial encounter: Secondary | ICD-10-CM | POA: Diagnosis not present

## 2021-08-10 DIAGNOSIS — M25511 Pain in right shoulder: Secondary | ICD-10-CM | POA: Diagnosis not present

## 2021-08-10 DIAGNOSIS — I10 Essential (primary) hypertension: Secondary | ICD-10-CM | POA: Diagnosis not present

## 2021-08-10 DIAGNOSIS — D649 Anemia, unspecified: Secondary | ICD-10-CM | POA: Diagnosis not present

## 2021-08-10 DIAGNOSIS — Z452 Encounter for adjustment and management of vascular access device: Secondary | ICD-10-CM | POA: Diagnosis not present

## 2021-08-10 DIAGNOSIS — M545 Low back pain, unspecified: Secondary | ICD-10-CM | POA: Diagnosis not present

## 2021-08-10 DIAGNOSIS — J8281 Chronic eosinophilic pneumonia: Secondary | ICD-10-CM | POA: Diagnosis not present

## 2021-08-10 DIAGNOSIS — G2581 Restless legs syndrome: Secondary | ICD-10-CM | POA: Diagnosis not present

## 2021-08-13 DIAGNOSIS — T8459XA Infection and inflammatory reaction due to other internal joint prosthesis, initial encounter: Secondary | ICD-10-CM | POA: Diagnosis not present

## 2021-08-16 DIAGNOSIS — M009 Pyogenic arthritis, unspecified: Secondary | ICD-10-CM | POA: Diagnosis not present

## 2021-08-20 DIAGNOSIS — T368X5S Adverse effect of other systemic antibiotics, sequela: Secondary | ICD-10-CM | POA: Diagnosis not present

## 2021-08-20 DIAGNOSIS — J8282 Acute eosinophilic pneumonia: Secondary | ICD-10-CM | POA: Diagnosis not present

## 2021-08-27 ENCOUNTER — Ambulatory Visit (HOSPITAL_COMMUNITY)
Admission: RE | Admit: 2021-08-27 | Discharge: 2021-08-27 | Disposition: A | Discharge status: Home / Self Care | Payer: Medicare PPO | Source: Ambulatory Visit | Attending: Internal Medicine | Admitting: Internal Medicine

## 2021-08-27 ENCOUNTER — Other Ambulatory Visit: Payer: Self-pay

## 2021-08-27 ENCOUNTER — Ambulatory Visit (INDEPENDENT_AMBULATORY_CARE_PROVIDER_SITE_OTHER): Payer: Medicare PPO | Admitting: Internal Medicine

## 2021-08-27 ENCOUNTER — Ambulatory Visit
Admission: RE | Admit: 2021-08-27 | Discharge: 2021-08-27 | Disposition: A | Discharge status: Home / Self Care | Payer: Medicare PPO | Source: Ambulatory Visit | Attending: Internal Medicine | Admitting: Internal Medicine

## 2021-08-27 VITALS — BP 183/72 | HR 71 | Temp 97.9°F | Wt 154.4 lb

## 2021-08-27 DIAGNOSIS — Z96611 Presence of right artificial shoulder joint: Secondary | ICD-10-CM

## 2021-08-27 DIAGNOSIS — J8281 Chronic eosinophilic pneumonia: Secondary | ICD-10-CM

## 2021-08-27 DIAGNOSIS — M7989 Other specified soft tissue disorders: Secondary | ICD-10-CM | POA: Insufficient documentation

## 2021-08-27 DIAGNOSIS — I700 Atherosclerosis of aorta: Secondary | ICD-10-CM | POA: Diagnosis not present

## 2021-08-27 DIAGNOSIS — J189 Pneumonia, unspecified organism: Secondary | ICD-10-CM | POA: Diagnosis not present

## 2021-08-27 DIAGNOSIS — T8459XA Infection and inflammatory reaction due to other internal joint prosthesis, initial encounter: Secondary | ICD-10-CM | POA: Diagnosis not present

## 2021-08-27 DIAGNOSIS — I517 Cardiomegaly: Secondary | ICD-10-CM | POA: Diagnosis not present

## 2021-08-27 NOTE — Progress Notes (Signed)
Left lower extremity venous duplex completed. ?Refer to "CV Proc" under chart review to view preliminary results. ? ?08/27/2021 12:57 PM ?Kelby Aline., MHA, RVT, RDCS, RDMS   ?

## 2021-08-27 NOTE — Progress Notes (Signed)
Patient ID: Natalie [name], female   DOB: 02/20/1946, 76 y.o.   MRN: [id number]  HPI 39 YF being treated for periprosthetic right shoulder infection. She underwent explantation of HW on 3/23, has new abtx spacer. Cultures negative at 10 days out from surgery. 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 Home health labs from 5/1 showed sed rate of 2 crp of 1  - having loose stools 2-3 x per day since 5/5 - still has picc  Outpatient Encounter Medications as of 08/27/2021  Medication Sig   aspirin 81 MG tablet Take 81 mg by mouth daily.   atenolol-chlorthalidone (TENORETIC) 100-25 MG per tablet Take 0.5 tablets by mouth daily.   atorvastatin (LIPITOR) 20 MG tablet Take 20 mg by mouth at bedtime.   Cholecalciferol (VITAMIN D) 125 MCG (5000 UT) CAPS Take 5,000 Units by mouth daily.   hydroxypropyl methylcellulose / hypromellose (ISOPTO TEARS / GONIOVISC) 2.5 % ophthalmic solution Place 1 drop into both eyes as needed for dry eyes.   losartan (COZAAR) 50 MG tablet Take 50 mg by mouth daily.   meloxicam (MOBIC) 15 MG tablet Take 1 tablet (15 mg total) by mouth at bedtime. (Patient taking differently: Take 15 mg by mouth daily.)   methocarbamol (ROBAXIN) 500 MG tablet Take 1 tablet (500 mg total) by mouth 4 (four) times daily.   omeprazole (PRILOSEC) 20 MG capsule TAKE 1 CAPSULE (20 MG TOTAL) TWO TIMES DAILY.   ondansetron (ZOFRAN) 4 MG tablet Take 1 tablet (4 mg total) by mouth every 8 (eight) hours as needed for nausea or vomiting.   oxyCODONE-acetaminophen (PERCOCET) 5-325 MG tablet Take 1 tablet by mouth every 4 (four) hours as needed (max 6 q).   pramipexole (MIRAPEX) 0.5 MG tablet  Take 0.5 mg by mouth at bedtime.   No facility-administered encounter medications on file as of 08/27/2021.     Patient Active Problem List   Diagnosis Date Noted   Acute low back pain 08/01/2021   Acute anemia 17/51/0258   Eosinophilic pneumonia ([place]) 52/77/8242   Multifocal pneumonia 07/31/2021   Aftercare following explantation of shoulder joint prosthesis 07/12/2021   History of total abdominal hysterectomy and bilateral salpingo-oophorectomy 02/22/2020   Upper airway cough syndrome 02/12/2017   Breast mass, right 08/07/2011   Osteopenia    Hyperlipidemia    Arthritis    Heart murmur    Hypertension      Health Maintenance Due  Topic Date Due   Hepatitis C Screening  Never done   TETANUS/TDAP  Never done   Zoster Vaccines- Shingrix (1 of 2) Never done   Pneumonia Vaccine 63+ Years old (2 - PPSV23 if available, else PCV20) 12/10/2012   COVID-19 Vaccine (3 - Moderna risk series) 07/27/2019     Review of Systems 12 point ros is negative except what is mentioned in hpi Physical Exam  BP (!) 183/72   Pulse 71   Temp 97.9 F (36.6 C) (Oral)   Wt 154 lb 6.4 oz (70 kg)   SpO2 99%   BMI 28.24 kg/m . Physical Exam  Constitutional:  oriented to person, place, and time. appears well-developed and well-nourished. No distress.  HENT: [place]/AT, PERRLA, no scleral icterus Mouth/Throat: Oropharynx is clear and moist.  No oropharyngeal exudate.  Cardiovascular: Normal rate, regular rhythm and normal heart sounds. Exam reveals no gallop and no friction rub.  No murmur heard.  Pulmonary/Chest: Effort normal and breath sounds normal. No respiratory distress.  has no wheezes.  Neck = supple, no nuchal rigidity Abdominal: Soft. Bowel sounds are normal.  exhibits no distension. There is no tenderness.  Lymphadenopathy: no cervical adenopathy. No axillary adenopathy Neurological: alert and oriented to person, place, and time.  Skin: Skin is warm and dry. No rash noted. No erythema.   Psychiatric: a normal mood and affect.  behavior is normal.    CBC Lab Results  Component Value Date   WBC 8.3 08/02/2021   RBC 3.09 (L) 08/02/2021   HGB 9.1 (L) 08/02/2021   HCT 28.8 (L) 08/02/2021   PLT 335 08/02/2021   MCV 93.2 08/02/2021   MCH 29.4 08/02/2021   MCHC 31.6 08/02/2021   RDW 14.0 08/02/2021   LYMPHSABS 0.7 08/02/2021   MONOABS 0.1 08/02/2021   EOSABS 0.1 08/02/2021    BMET Lab Results  Component Value Date   NA 137 08/01/2021   K 3.7 08/01/2021   CL 104 08/01/2021   CO2 24 08/01/2021   GLUCOSE 102 (H) 08/01/2021   BUN 19 08/01/2021   CREATININE 0.88 08/01/2021   CALCIUM 8.9 08/01/2021   GFRNONAA >60 08/01/2021      Assessment and Plan  Right shoulder pji s/p 2 staged revision with c.acnes/p.acnes = will check sed rate and crp. Will pull picc line. See dr supple beginning of June in follow up. 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 Also has pulm follow up appt  Left lower extremity edema = ? Rule out DVT

## 2021-08-27 NOTE — Progress Notes (Signed)
PICC Removal   Labs drawn via PICC. Line flushed with ease, blood return noted.    PICC length & location:  left basilic 40 cm Removed per verbal order from:  Dr. Baxter Flattery  Blood thinners:  aspirin 81 mg  Platelet count:  259 (08/20/21 [facility])  Site assessment: Dressing clean and dry. Extremity warm and dry with palpable radial pulse. No redness, drainage, or swelling present at insertion site.   Pre-removal vital signs:  HR:  67 BP:  165/89 SpO2:  98% RA  Patient unable to lie in  flat, supine position. No sutures present. Insertion site cleaned with CHG, catheter removed and petroleum dressing applied. Tip intact. Pressure held until hemostasis achieved.    Length of catheter removed:  40 cm   Education provided to patient:  Seek emergent care if you develop numbness or tingling, pain, redness, or swelling in the extremity. Seek emergent care if dressing becomes soaked with blood or if sharp pain, shortness of breath, chest pain, or neurological symptoms present.  No heavy lifting with affected arm for 48 hours, leave dressing in place for 24 hours. May shower once dressing is removed.  Notify the clinic if you develop a fever or if warmth, redness, tenderness, or drainage develop at the site.   Patient verbalized understanding and agreement, all questions answered. Patient tolerated procedure well and remained in clinic under the care of RN 30 minutes post removal.  Post-observation vital signs:  HR:  67 BP:  169/82 SpO2:  96% RA  Notified Carolynn Sayers, RN and [facility] pharmacy team of removal.  Beryle Flock, RN

## 2021-08-28 LAB — CBC WITH DIFFERENTIAL/PLATELET
Absolute Monocytes: 390 cells/uL (ref 200–950)
Basophils Absolute: 30 cells/uL (ref 0–200)
Basophils Relative: 0.5 %
Eosinophils Absolute: 462 cells/uL (ref 15–500)
Eosinophils Relative: 7.7 %
HCT: 29.8 % — ABNORMAL LOW (ref 35.0–45.0)
Hemoglobin: 9.5 g/dL — ABNORMAL LOW (ref 11.7–15.5)
Lymphs Abs: 1338 cells/uL (ref 850–3900)
MCH: 29.8 pg (ref 27.0–33.0)
MCHC: 31.9 g/dL — ABNORMAL LOW (ref 32.0–36.0)
MCV: 93.4 fL (ref 80.0–100.0)
MPV: 10.5 fL (ref 7.5–12.5)
Monocytes Relative: 6.5 %
Neutro Abs: 3780 cells/uL (ref 1500–7800)
Neutrophils Relative %: 63 %
Platelets: 174 10*3/uL (ref 140–400)
RBC: 3.19 10*6/uL — ABNORMAL LOW (ref 3.80–5.10)
RDW: 14.6 % (ref 11.0–15.0)
Total Lymphocyte: 22.3 %
WBC: 6 10*3/uL (ref 3.8–10.8)

## 2021-08-28 LAB — SEDIMENTATION RATE: Sed Rate: 9 mm/h (ref 0–30)

## 2021-08-28 LAB — BASIC METABOLIC PANEL
BUN: 21 mg/dL (ref 7–25)
CO2: 27 mmol/L (ref 20–32)
Calcium: 8.9 mg/dL (ref 8.6–10.4)
Chloride: 103 mmol/L (ref 98–110)
Creat: 0.78 mg/dL (ref 0.60–1.00)
Glucose, Bld: 82 mg/dL (ref 65–99)
Potassium: 4 mmol/L (ref 3.5–5.3)
Sodium: 137 mmol/L (ref 135–146)

## 2021-08-28 LAB — C-REACTIVE PROTEIN: CRP: 8.4 mg/L — ABNORMAL HIGH (ref <8.0)

## 2021-08-29 ENCOUNTER — Telehealth: Payer: Self-pay | Admitting: Internal Medicine

## 2021-08-29 NOTE — Telephone Encounter (Signed)
Left VM mentioning results of test, cxr, and dopplers. All good. ?

## 2021-08-31 ENCOUNTER — Ambulatory Visit (INDEPENDENT_AMBULATORY_CARE_PROVIDER_SITE_OTHER): Payer: Medicare PPO | Admitting: Pulmonary Disease

## 2021-08-31 ENCOUNTER — Encounter: Payer: Self-pay | Admitting: Pulmonary Disease

## 2021-08-31 VITALS — BP 128/74 | HR 74 | Ht 62.0 in | Wt 150.0 lb

## 2021-08-31 DIAGNOSIS — J8281 Chronic eosinophilic pneumonia: Secondary | ICD-10-CM

## 2021-08-31 NOTE — Patient Instructions (Addendum)
Your chest x-ray is improved since your hospitalization ? ?We do not need to treat with steroids any more ? ?I am glad your breathing is better  ? ?Please follow up as needed  ?

## 2021-08-31 NOTE — Progress Notes (Signed)
? ?Synopsis: Referred in May 1610 for Eosinophilic Pneumonia ? ?Subjective:  ? ?PATIENT ID: Natalie [name] GENDER: female DOB: [birth year], MRN: [id number] ? ?HPI ? ?Chief Complaint  ?Patient presents with  ? Hospitalization Follow-up  ?  HFU after having PNA back in April 2023.   ? 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  ? ?She had right shoulder septic arthritis s/p washout with antibiotic spacer placement and treated with prolonged course of ceftriaxone + daptomycin since 07/12/21. She presented to [facility] 4/12 with fevers, back pains, and progressive dyspnea.  ?  ?She was noted to have diffuse bilateral parenchymal opacities on CTA Chest and absolute peripheral eosinophil count of 1,300.  ?  ?Infectious disease was consulted with concern for eosinophilic pneumonia due to daptomycin therapy. She had been on daptomycin for 3 weeks. She was started on high dose steroids. Pulmonary was consulted for further evaluation. Discussion was held with patient during hospitalization about empiric treatment with steroids vs performing bronchoscopy with BAL to rule out infection and also help with confirming diagnosis of eosinophilic pneumonia. Patient preferred empiric steroid treatment. She was treated with solumedrol '80mg'$  daily and transitioned to prednisone taper. She was requiring 2L of O2 at that time and did not require O2 at discharge. ? ?She is back to her baseline breathing status. She denies cough, wheezing, dyspnea or chest tightness. She had lower extremity dopplers done for leg swelling which was negative for DVT. Chest x-ray from 08/27/21 showed resolution of opacities.  ? ?She is accompanied by her husband.  ?  ? ?Past Medical History:  ?Diagnosis Date  ? Arthritis   ? Back pain   ? Cancer [facility])   ? Change in hearing   ? Generalized headaches   ? GERD (gastroesophageal reflux disease)   ? Heart  murmur   ? Pt denies  ? Hyperlipidemia   ? Hypertension   ? Joint pain   ? Leg pain   ? Osteopenia 04/2015  ? T score -1.9 FRAX 12%/2.6% stable from prior DEXA  ? PONV (postoperative nausea and vomiting)   ? Skin cancer   ? Varicose veins of leg with pain Left  > Right  ?  ? ?Family History  ?Problem Relation Age of Onset  ? Heart disease Mother   ? Hypertension Mother   ? Heart disease Father   ? Hypertension Father   ? Diabetes Daughter   ? Heart Problems Daughter   ?  ? ?Social History  ? ?Socioeconomic History  ? Marital status: Married  ?  Spouse name: Not on file  ? Number of children: Not on file  ? Years of education: Not on file  ? Highest education level: Not on file  ?Occupational History  ? Not on file  ?Tobacco Use  ? Smoking status: Never  ? Smokeless tobacco: Never  ?Vaping Use  ? Vaping Use: Never used  ?Substance and Sexual Activity  ? Alcohol use: No  ? Drug use: No  ? Sexual activity: Not Currently  ?  Birth control/protection: Surgical  ?  Comment: 1st intercourse 70yo-1 partner  ?Other Topics Concern  ? Not on file  ?Social History Narrative  ? Not on file  ? ?Social Determinants of Health  ? ?Financial Resource Strain: Not on file  ?Food Insecurity: Not on file  ?Transportation Needs: Not on file  ?Physical Activity: Not on file  ?  Stress: Not on file  ?Social Connections: Not on file  ?Intimate Partner Violence: Not on file  ?  ? ?Allergies  ?Allergen Reactions  ? Daptomycin Other (See Comments)  ?  Eosinophilic pneumonia  ? Cephalexin   ?  Can not recall reaction  ? Nitrofurantoin Monohyd Macro   ?  Call not recall reaction  ? Sulfa Antibiotics Nausea Only  ?  ? ?Outpatient Medications Prior to Visit  ?Medication Sig Dispense Refill  ? aspirin 81 MG tablet Take 81 mg by mouth daily.    ? atenolol-chlorthalidone (TENORETIC) 100-25 MG per tablet Take 0.5 tablets by mouth daily.    ? atorvastatin (LIPITOR) 20 MG tablet Take 20 mg by mouth at bedtime.    ? Cholecalciferol (VITAMIN D) 125 MCG (5000  UT) CAPS Take 5,000 Units by mouth daily.    ? hydroxypropyl methylcellulose / hypromellose (ISOPTO TEARS / GONIOVISC) 2.5 % ophthalmic solution Place 1 drop into both eyes as needed for dry eyes.    ? losartan (COZAAR) 50 MG tablet Take 50 mg by mouth daily.    ? meloxicam (MOBIC) 15 MG tablet Take 1 tablet (15 mg total) by mouth at bedtime. (Patient taking differently: Take 15 mg by mouth daily.) 30 tablet 1  ? methocarbamol (ROBAXIN) 500 MG tablet Take 1 tablet (500 mg total) by mouth 4 (four) times daily. 40 tablet 0  ? omeprazole (PRILOSEC) 20 MG capsule TAKE 1 CAPSULE (20 MG TOTAL) TWO TIMES DAILY. 180 capsule 0  ? ondansetron (ZOFRAN) 4 MG tablet Take 1 tablet (4 mg total) by mouth every 8 (eight) hours as needed for nausea or vomiting. 16 tablet 0  ? oxyCODONE-acetaminophen (PERCOCET) 5-325 MG tablet Take 1 tablet by mouth every 4 (four) hours as needed (max 6 q). 30 tablet 0  ? pramipexole (MIRAPEX) 0.5 MG tablet Take 0.5 mg by mouth at bedtime.    ? ?No facility-administered medications prior to visit.  ? ? ?Review of Systems  ?Constitutional:  Negative for chills, fever, malaise/fatigue and weight loss.  ?HENT:  Negative for congestion, sinus pain and sore throat.   ?Eyes: Negative.   ?Respiratory:  Negative for cough, hemoptysis, sputum production, shortness of breath and wheezing.   ?Cardiovascular:  Negative for chest pain, palpitations, orthopnea, claudication and leg swelling.  ?Gastrointestinal:  Negative for abdominal pain, heartburn, nausea and vomiting.  ?Genitourinary: Negative.   ?Musculoskeletal:  Negative for joint pain and myalgias.  ?Skin:  Negative for rash.  ?Neurological:  Negative for weakness.  ?Endo/Heme/Allergies: Negative.   ?Psychiatric/Behavioral: Negative.    ? ?Objective:  ? ?Vitals:  ? 08/31/21 1419  ?BP: 128/74  ?Pulse: 74  ?SpO2: 98%  ?Weight: 150 lb (68 kg)  ?Height: '5\' 2"'$  (1.575 m)  ? ? ?Physical Exam ?Constitutional:   ?   General: She is not in acute distress. ?    Appearance: She is not ill-appearing.  ?HENT:  ?   Head: Normocephalic and atraumatic.  ?Eyes:  ?   General: No scleral icterus. ?   Conjunctiva/sclera: Conjunctivae normal.  ?   Pupils: Pupils are equal, round, and reactive to light.  ?Cardiovascular:  ?   Rate and Rhythm: Normal rate and regular rhythm.  ?   Pulses: Normal pulses.  ?   Heart sounds: Normal heart sounds. No murmur heard. ?Pulmonary:  ?   Effort: Pulmonary effort is normal.  ?   Breath sounds: Normal breath sounds. No wheezing, rhonchi or rales.  ?Abdominal:  ?   General:  Bowel sounds are normal.  ?   Palpations: Abdomen is soft.  ?Musculoskeletal:  ?   Right lower leg: No edema.  ?   Left lower leg: No edema.  ?Lymphadenopathy:  ?   Cervical: No cervical adenopathy.  ?Skin: ?   General: Skin is warm and dry.  ?Neurological:  ?   General: No focal deficit present.  ?   Mental Status: She is alert.  ?Psychiatric:     ?   Mood and Affect: Mood normal.     ?   Behavior: Behavior normal.     ?   Thought Content: Thought content normal.     ?   Judgment: Judgment normal.  ? ?CBC ?   ?Component Value Date/Time  ? WBC 6.0 08/27/2021 1118  ? RBC 3.19 (L) 08/27/2021 1118  ? HGB 9.5 (L) 08/27/2021 1118  ? HCT 29.8 (L) 08/27/2021 1118  ? PLT 174 08/27/2021 1118  ? MCV 93.4 08/27/2021 1118  ? MCH 29.8 08/27/2021 1118  ? MCHC 31.9 (L) 08/27/2021 1118  ? RDW 14.6 08/27/2021 1118  ? LYMPHSABS 1,338 08/27/2021 1118  ? MONOABS 0.1 08/02/2021 0407  ? EOSABS 462 08/27/2021 1118  ? BASOSABS 30 08/27/2021 1118  ? ? ?  Latest Ref Rng & Units 08/27/2021  ? 11:18 AM 08/01/2021  ?  4:43 AM 07/31/2021  ?  9:55 PM  ?BMP  ?Glucose 65 - 99 mg/dL 82   102   107    ?BUN 7 - 25 mg/dL '21   19   11    '$ ?Creatinine 0.60 - 1.00 mg/dL 0.78   0.88   0.70    ?BUN/Creat Ratio 6 - 22 (calc) NOT APPLICABLE      ?Sodium 135 - 146 mmol/L 137   137   135    ?Potassium 3.5 - 5.3 mmol/L 4.0   3.7   3.3    ?Chloride 98 - 110 mmol/L 103   104   99    ?CO2 20 - 32 mmol/L 27   24     ?Calcium 8.6 - 10.4  mg/dL 8.9   8.9     ? ?Chest imaging: ?CXR 08/27/21 ?Clear lungs. Resolution of the multifocal pneumonia seen on ?07/31/2021 CT. ? ?CTA Chest 07/31/21 ?Mediastinum/Nodes: No enlarged nodes. Thyroid and esophagus ar

## 2021-09-25 DIAGNOSIS — Z4789 Encounter for other orthopedic aftercare: Secondary | ICD-10-CM | POA: Diagnosis not present

## 2021-10-02 DIAGNOSIS — E785 Hyperlipidemia, unspecified: Secondary | ICD-10-CM | POA: Diagnosis not present

## 2021-10-02 DIAGNOSIS — M79672 Pain in left foot: Secondary | ICD-10-CM | POA: Diagnosis not present

## 2021-10-02 DIAGNOSIS — M25512 Pain in left shoulder: Secondary | ICD-10-CM | POA: Diagnosis not present

## 2021-10-02 DIAGNOSIS — I10 Essential (primary) hypertension: Secondary | ICD-10-CM | POA: Diagnosis not present

## 2021-10-10 DIAGNOSIS — T8459XA Infection and inflammatory reaction due to other internal joint prosthesis, initial encounter: Secondary | ICD-10-CM | POA: Diagnosis not present

## 2021-10-10 DIAGNOSIS — Z96611 Presence of right artificial shoulder joint: Secondary | ICD-10-CM | POA: Diagnosis not present

## 2021-10-10 DIAGNOSIS — M25511 Pain in right shoulder: Secondary | ICD-10-CM | POA: Diagnosis not present

## 2021-10-11 DIAGNOSIS — M654 Radial styloid tenosynovitis [de Quervain]: Secondary | ICD-10-CM | POA: Diagnosis not present

## 2021-10-11 DIAGNOSIS — M25532 Pain in left wrist: Secondary | ICD-10-CM | POA: Diagnosis not present

## 2021-10-15 ENCOUNTER — Telehealth: Payer: Self-pay

## 2021-10-26 DIAGNOSIS — Z4789 Encounter for other orthopedic aftercare: Secondary | ICD-10-CM | POA: Diagnosis not present

## 2021-10-31 DIAGNOSIS — G2581 Restless legs syndrome: Secondary | ICD-10-CM | POA: Diagnosis not present

## 2021-10-31 DIAGNOSIS — M25511 Pain in right shoulder: Secondary | ICD-10-CM | POA: Diagnosis not present

## 2021-10-31 DIAGNOSIS — J8281 Chronic eosinophilic pneumonia: Secondary | ICD-10-CM | POA: Diagnosis not present

## 2021-10-31 DIAGNOSIS — Z452 Encounter for adjustment and management of vascular access device: Secondary | ICD-10-CM | POA: Diagnosis not present

## 2021-10-31 DIAGNOSIS — D649 Anemia, unspecified: Secondary | ICD-10-CM | POA: Diagnosis not present

## 2021-10-31 DIAGNOSIS — R82998 Other abnormal findings in urine: Secondary | ICD-10-CM | POA: Diagnosis not present

## 2021-10-31 DIAGNOSIS — M545 Low back pain, unspecified: Secondary | ICD-10-CM | POA: Diagnosis not present

## 2021-10-31 DIAGNOSIS — I10 Essential (primary) hypertension: Secondary | ICD-10-CM | POA: Diagnosis not present

## 2021-10-31 NOTE — Progress Notes (Signed)
Sent message, via epic in basket, requesting order in epic from surgeon     10/31/21 1011  Preop Orders  Has preop orders? No  Name of staff/physician contacted for orders(Indicate phone or IB message) T. Shuford, PA-C.

## 2021-11-06 NOTE — Progress Notes (Signed)
Please place orders for PAT appointment scheduled 11/07/21.

## 2021-11-06 NOTE — Progress Notes (Addendum)
No orders at time of PST appointment  COVID Vaccine Completed: yes x2  Date of COVID positive in last 90 days:  PCP - Prince Solian, MD Cardiologist - n/a  Chest x-ray - 08/27/21 Epic EKG - 08/02/21 Epic Stress Test - n/a ECHO -  n/a Cardiac Cath - n/a Pacemaker/ICD device last checked: n/a Spinal Cord Stimulator: n/a  Bowel Prep - n/a  Sleep Study - n/a CPAP -   Fasting Blood Sugar - n/a Checks Blood Sugar _____ times a day  Blood Thinner Instructions:  Aspirin Instructions: ASA 81, hold 7 days Last Dose:  Activity level: Can go up a flight of stairs and perform activities of daily living without stopping and without symptoms of chest pain. SOB with exertion due to deconditioning     Anesthesia review:   Patient denies shortness of breath, fever, cough and chest pain at PAT appointment  Patient verbalized understanding of instructions that were given to them at the PAT appointment. Patient was also instructed that they will need to review over the PAT instructions again at home before surgery.

## 2021-11-06 NOTE — Patient Instructions (Signed)
DUE TO COVID-19 ONLY TWO VISITORS  (aged 78 and older)  ARE ALLOWED TO COME WITH YOU AND STAY IN THE WAITING ROOM ONLY DURING PRE OP AND PROCEDURE.   **NO VISITORS ARE ALLOWED IN THE SHORT STAY AREA OR RECOVERY ROOM!!**  IF YOU WILL BE ADMITTED INTO THE HOSPITAL YOU ARE ALLOWED ONLY FOUR SUPPORT PEOPLE DURING VISITATION HOURS ONLY (7 AM -8PM)   The support person(s) must pass our screening, gel in and out, and wear a mask at all times, including in the patient's room. Patients must also wear a mask when staff or their support person are in the room. Visitors GUEST BADGE MUST BE WORN VISIBLY  One adult visitor may remain with you overnight and MUST be in the room by 8 P.M.     Your procedure is scheduled on: 11/15/21   Report to [facility] Main Entrance    Report to admitting at 7:45 AM   Call this number if you have problems the morning of surgery [phone]   Do not eat food :After Midnight.   After Midnight you may have the following liquids until 7:00 AM DAY OF SURGERY  Water Black Coffee (sugar ok, NO MILK/CREAM OR CREAMERS)  Tea (sugar ok, NO MILK/CREAM OR CREAMERS) regular and decaf                             Plain Jell-O (NO RED)                                           Fruit ices (not with fruit pulp, NO RED)                                     Popsicles (NO RED)                                                                  Juice: apple, WHITE grape, WHITE cranberry Sports drinks like Gatorade (NO RED) Clear broth(vegetable,chicken,beef)  FOLLOW BOWEL PREP AND ANY ADDITIONAL PRE OP INSTRUCTIONS YOU RECEIVED FROM YOUR SURGEON'S OFFICE!!!     Oral Hygiene is also important to reduce your risk of infection.                                    Remember - BRUSH YOUR TEETH THE MORNING OF SURGERY WITH YOUR REGULAR TOOTHPASTE   Take these medicines the morning of surgery with A SIP OF WATER: Tylenol, Atorvastatin, Omeprazole                              You may  not have any metal on your body including hair pins, jewelry, and body piercing             Do not wear make-up, lotions, powders, perfumes, or deodorant  Do not wear nail polish including gel and S&S, artificial/acrylic nails, or any other type of  covering on natural nails including finger and toenails. If you have artificial nails, gel coating, etc. that needs to be removed by a nail salon please have this removed prior to surgery or surgery may need to be canceled/ delayed if the surgeon/ anesthesia feels like they are unable to be safely monitored.   Do not shave  48 hours prior to surgery.    Do not bring valuables to the hospital. [place].   Bring small overnight bag day of surgery.   DO NOT [place]. PHARMACY WILL DISPENSE MEDICATIONS LISTED ON YOUR MEDICATION LIST TO YOU DURING YOUR ADMISSION [place]!               Please read over the following fact sheets you were given: IF YOU HAVE QUESTIONS ABOUT YOUR PRE-OP INSTRUCTIONS PLEASE CALL [place] - Preparing for Surgery Before surgery, you can play an important role.  Because skin is not sterile, your skin needs to be as free of germs as possible.  You can reduce the number of germs on your skin by washing with CHG (chlorahexidine gluconate) soap before surgery.  CHG is an antiseptic cleaner which kills germs and bonds with the skin to continue killing germs even after washing. Please DO NOT use if you have an allergy to CHG or antibacterial soaps.  If your skin becomes reddened/irritated stop using the CHG and inform your nurse when you arrive at Short Stay. Do not shave (including legs and underarms) for at least 48 hours prior to the first CHG shower.  You may shave your face/neck.  Please follow these instructions carefully:  1.  Shower with CHG Soap the night before surgery and the  morning of surgery.  2.  If  you choose to wash your hair, wash your hair first as usual with your normal  shampoo.  3.  After you shampoo, rinse your hair and body thoroughly to remove the shampoo.                             4.  Use CHG as you would any other liquid soap.  You can apply chg directly to the skin and wash.  Gently with a scrungie or clean washcloth.  5.  Apply the CHG Soap to your body ONLY FROM THE NECK DOWN.   Do   not use on face/ open                           Wound or open sores. Avoid contact with eyes, ears mouth and   genitals (private parts).                       Wash face,  Genitals (private parts) with your normal soap.             6.  Wash thoroughly, paying special attention to the area where your    surgery  will be performed.  7.  Thoroughly rinse your body with warm water from the neck down.  8.  DO NOT shower/wash with your normal soap after using and rinsing off the CHG Soap.                9.  Pat yourself dry with a clean towel.            10.  Wear clean pajamas.            11.  Place clean sheets on your bed the night of your first shower and do not  sleep with pets. Day of Surgery : Do not apply any lotions/deodorants the morning of surgery.  Please wear clean clothes to the hospital/surgery center.  FAILURE TO FOLLOW THESE INSTRUCTIONS MAY RESULT IN THE CANCELLATION OF YOUR SURGERY  PATIENT SIGNATURE_________________________________  NURSE SIGNATURE__________________________________  ________________________________________________________________________

## 2021-11-07 ENCOUNTER — Encounter (HOSPITAL_COMMUNITY)
Admission: RE | Admit: 2021-11-07 | Discharge: 2021-11-07 | Disposition: A | Discharge status: Home / Self Care | Payer: Medicare PPO | Source: Ambulatory Visit | Attending: Orthopedic Surgery | Admitting: Orthopedic Surgery

## 2021-11-07 ENCOUNTER — Encounter (HOSPITAL_COMMUNITY): Payer: Self-pay

## 2021-11-07 VITALS — BP 179/79 | HR 65 | Temp 97.5°F | Resp 14 | Ht 62.0 in | Wt 153.4 lb

## 2021-11-07 DIAGNOSIS — I10 Essential (primary) hypertension: Secondary | ICD-10-CM | POA: Diagnosis not present

## 2021-11-07 DIAGNOSIS — Z01818 Encounter for other preprocedural examination: Secondary | ICD-10-CM

## 2021-11-07 DIAGNOSIS — Z01812 Encounter for preprocedural laboratory examination: Secondary | ICD-10-CM | POA: Diagnosis not present

## 2021-11-07 HISTORY — DX: Pneumonia, unspecified organism: J18.9

## 2021-11-07 HISTORY — DX: Anemia, unspecified: D64.9

## 2021-11-07 LAB — CBC
HCT: 33.1 % — ABNORMAL LOW (ref 36.0–46.0)
Hemoglobin: 10.8 g/dL — ABNORMAL LOW (ref 12.0–15.0)
MCH: 29.3 pg (ref 26.0–34.0)
MCHC: 32.6 g/dL (ref 30.0–36.0)
MCV: 89.7 fL (ref 80.0–100.0)
Platelets: 238 10*3/uL (ref 150–400)
RBC: 3.69 MIL/uL — ABNORMAL LOW (ref 3.87–5.11)
RDW: 14.6 % (ref 11.5–15.5)
WBC: 5.8 10*3/uL (ref 4.0–10.5)
nRBC: 0 % (ref 0.0–0.2)

## 2021-11-07 LAB — BASIC METABOLIC PANEL
Anion gap: 10 (ref 5–15)
BUN: 27 mg/dL — ABNORMAL HIGH (ref 8–23)
CO2: 26 mmol/L (ref 22–32)
Calcium: 9.4 mg/dL (ref 8.9–10.3)
Chloride: 101 mmol/L (ref 98–111)
Creatinine, Ser: 0.9 mg/dL (ref 0.44–1.00)
GFR, Estimated: >60 mL/min (ref >60)
Glucose, Bld: 97 mg/dL (ref 70–99)
Potassium: 3.7 mmol/L (ref 3.5–5.1)
Sodium: 137 mmol/L (ref 135–145)

## 2021-11-07 LAB — SURGICAL PCR SCREEN
MRSA, PCR: NEGATIVE
Staphylococcus aureus: NEGATIVE

## 2021-11-15 ENCOUNTER — Inpatient Hospital Stay (HOSPITAL_COMMUNITY): Payer: Medicare PPO | Admitting: Certified Registered"

## 2021-11-15 ENCOUNTER — Other Ambulatory Visit: Payer: Self-pay

## 2021-11-15 ENCOUNTER — Inpatient Hospital Stay (HOSPITAL_COMMUNITY)
Admission: RE | Admit: 2021-11-15 | Discharge: 2021-11-16 | DRG: 483 | Disposition: A | Discharge status: Home / Self Care | Payer: Medicare PPO | Attending: Orthopedic Surgery | Admitting: Orthopedic Surgery

## 2021-11-15 ENCOUNTER — Encounter (HOSPITAL_COMMUNITY)
Admission: RE | Disposition: A | Discharge status: Home / Self Care | Payer: Self-pay | Source: Home / Self Care | Attending: Orthopedic Surgery

## 2021-11-15 ENCOUNTER — Encounter (HOSPITAL_COMMUNITY): Payer: Self-pay | Admitting: Orthopedic Surgery

## 2021-11-15 ENCOUNTER — Inpatient Hospital Stay (HOSPITAL_COMMUNITY): Payer: Medicare PPO

## 2021-11-15 DIAGNOSIS — Y831 Surgical operation with implant of artificial internal device as the cause of abnormal reaction of the patient, or of later complication, without mention of misadventure at the time of the procedure: Secondary | ICD-10-CM | POA: Diagnosis present

## 2021-11-15 DIAGNOSIS — Z90722 Acquired absence of ovaries, bilateral: Secondary | ICD-10-CM | POA: Diagnosis not present

## 2021-11-15 DIAGNOSIS — Z7951 Long term (current) use of inhaled steroids: Secondary | ICD-10-CM

## 2021-11-15 DIAGNOSIS — K219 Gastro-esophageal reflux disease without esophagitis: Secondary | ICD-10-CM | POA: Diagnosis not present

## 2021-11-15 DIAGNOSIS — Z96611 Presence of right artificial shoulder joint: Secondary | ICD-10-CM | POA: Diagnosis not present

## 2021-11-15 DIAGNOSIS — T8489XA Other specified complication of internal orthopedic prosthetic devices, implants and grafts, initial encounter: Secondary | ICD-10-CM | POA: Diagnosis not present

## 2021-11-15 DIAGNOSIS — Z9889 Other specified postprocedural states: Secondary | ICD-10-CM | POA: Diagnosis not present

## 2021-11-15 DIAGNOSIS — I10 Essential (primary) hypertension: Secondary | ICD-10-CM | POA: Diagnosis not present

## 2021-11-15 DIAGNOSIS — E785 Hyperlipidemia, unspecified: Secondary | ICD-10-CM | POA: Diagnosis present

## 2021-11-15 DIAGNOSIS — Z9079 Acquired absence of other genital organ(s): Secondary | ICD-10-CM | POA: Diagnosis not present

## 2021-11-15 DIAGNOSIS — Z90710 Acquired absence of both cervix and uterus: Secondary | ICD-10-CM

## 2021-11-15 DIAGNOSIS — Z85828 Personal history of other malignant neoplasm of skin: Secondary | ICD-10-CM

## 2021-11-15 DIAGNOSIS — G8918 Other acute postprocedural pain: Secondary | ICD-10-CM | POA: Diagnosis not present

## 2021-11-15 DIAGNOSIS — Z471 Aftercare following joint replacement surgery: Secondary | ICD-10-CM | POA: Diagnosis not present

## 2021-11-15 DIAGNOSIS — Z7982 Long term (current) use of aspirin: Secondary | ICD-10-CM | POA: Diagnosis not present

## 2021-11-15 DIAGNOSIS — T8484XA Pain due to internal orthopedic prosthetic devices, implants and grafts, initial encounter: Principal | ICD-10-CM | POA: Diagnosis present

## 2021-11-15 DIAGNOSIS — Z4731 Aftercare following explantation of shoulder joint prosthesis: Secondary | ICD-10-CM | POA: Diagnosis not present

## 2021-11-15 DIAGNOSIS — Z79899 Other long term (current) drug therapy: Secondary | ICD-10-CM

## 2021-11-15 DIAGNOSIS — T84098A Other mechanical complication of other internal joint prosthesis, initial encounter: Secondary | ICD-10-CM | POA: Diagnosis not present

## 2021-11-15 HISTORY — PX: TOTAL SHOULDER REVISION: SHX6130

## 2021-11-15 LAB — TYPE AND SCREEN
ABO/RH(D): A POS
Antibody Screen: NEGATIVE

## 2021-11-15 SURGERY — REVISION, TOTAL ARTHROPLASTY, SHOULDER
Anesthesia: General | Site: Shoulder | Laterality: Right

## 2021-11-15 MED ORDER — 0.9 % SODIUM CHLORIDE (POUR BTL) OPTIME
TOPICAL | Status: DC | PRN
  Administered 2021-11-15: 1000 mL

## 2021-11-15 MED ORDER — PHENYLEPHRINE 80 MCG/ML (10ML) SYRINGE FOR IV PUSH (FOR BLOOD PRESSURE SUPPORT)
PREFILLED_SYRINGE | INTRAVENOUS | Status: DC | PRN
  Administered 2021-11-15: 80 ug via INTRAVENOUS
  Administered 2021-11-15: 160 ug via INTRAVENOUS

## 2021-11-15 MED ORDER — OXYCODONE HCL 5 MG PO TABS
5.0000 mg | ORAL_TABLET | Freq: Once | ORAL | Status: AC | PRN
  Administered 2021-11-15: 5 mg via ORAL

## 2021-11-15 MED ORDER — HYDROCHLOROTHIAZIDE 12.5 MG PO TABS
12.5000 mg | ORAL_TABLET | Freq: Every day | ORAL | Status: DC
  Administered 2021-11-16: 12.5 mg via ORAL
  Filled 2021-11-15: qty 1

## 2021-11-15 MED ORDER — METHOCARBAMOL 500 MG IVPB - SIMPLE MED
500.0000 mg | Freq: Four times a day (QID) | INTRAVENOUS | Status: DC | PRN
  Administered 2021-11-15: 500 mg via INTRAVENOUS

## 2021-11-15 MED ORDER — HYDROMORPHONE HCL 1 MG/ML IJ SOLN
0.5000 mg | INTRAMUSCULAR | Status: DC | PRN
  Administered 2021-11-15: 1 mg via INTRAVENOUS
  Filled 2021-11-15: qty 1

## 2021-11-15 MED ORDER — ALUM & MAG HYDROXIDE-SIMETH 200-200-20 MG/5ML PO SUSP: 30.0000 mL | ORAL | Status: DC | PRN

## 2021-11-15 MED ORDER — FENTANYL CITRATE (PF) 100 MCG/2ML IJ SOLN
INTRAMUSCULAR | Status: DC | PRN
  Administered 2021-11-15: 50 ug via INTRAVENOUS

## 2021-11-15 MED ORDER — VANCOMYCIN HCL IN DEXTROSE 1-5 GM/200ML-% IV SOLN
1000.0000 mg | INTRAVENOUS | Status: AC
  Administered 2021-11-15: 1000 mg via INTRAVENOUS
  Filled 2021-11-15: qty 200

## 2021-11-15 MED ORDER — PHENYLEPHRINE 80 MCG/ML (10ML) SYRINGE FOR IV PUSH (FOR BLOOD PRESSURE SUPPORT)
PREFILLED_SYRINGE | INTRAVENOUS | Status: AC
  Filled 2021-11-15: qty 10

## 2021-11-15 MED ORDER — LIDOCAINE 2% (20 MG/ML) 5 ML SYRINGE
INTRAMUSCULAR | Status: DC | PRN
  Administered 2021-11-15: 100 mg via INTRAVENOUS

## 2021-11-15 MED ORDER — PHENOL 1.4 % MT LIQD: 1.0000 | OROMUCOSAL | Status: DC | PRN

## 2021-11-15 MED ORDER — KETOROLAC TROMETHAMINE 30 MG/ML IJ SOLN
30.0000 mg | Freq: Once | INTRAMUSCULAR | Status: AC | PRN
  Administered 2021-11-15: 30 mg via INTRAVENOUS

## 2021-11-15 MED ORDER — PANTOPRAZOLE SODIUM 40 MG PO TBEC
40.0000 mg | DELAYED_RELEASE_TABLET | Freq: Every day | ORAL | Status: DC
  Administered 2021-11-16: 40 mg via ORAL
  Filled 2021-11-15: qty 1

## 2021-11-15 MED ORDER — POLYETHYLENE GLYCOL 3350 17 G PO PACK: 17.0000 g | PACK | Freq: Every day | ORAL | Status: DC | PRN

## 2021-11-15 MED ORDER — ATORVASTATIN CALCIUM 20 MG PO TABS: 20.0000 mg | ORAL_TABLET | Freq: Every day | ORAL | Status: DC

## 2021-11-15 MED ORDER — BISACODYL 5 MG PO TBEC: 5.0000 mg | DELAYED_RELEASE_TABLET | Freq: Every day | ORAL | Status: DC | PRN

## 2021-11-15 MED ORDER — FENTANYL CITRATE PF 50 MCG/ML IJ SOSY
25.0000 ug | PREFILLED_SYRINGE | INTRAMUSCULAR | Status: DC | PRN
  Administered 2021-11-15 (×3): 50 ug via INTRAVENOUS

## 2021-11-15 MED ORDER — LOSARTAN POTASSIUM 50 MG PO TABS
50.0000 mg | ORAL_TABLET | Freq: Every day | ORAL | Status: DC
  Administered 2021-11-16: 50 mg via ORAL
  Filled 2021-11-15: qty 1

## 2021-11-15 MED ORDER — VANCOMYCIN HCL 1000 MG IV SOLR
INTRAVENOUS | Status: DC | PRN
  Administered 2021-11-15: 1000 mg

## 2021-11-15 MED ORDER — VANCOMYCIN HCL 1000 MG IV SOLR
INTRAVENOUS | Status: AC
  Filled 2021-11-15: qty 20

## 2021-11-15 MED ORDER — LABETALOL HCL 5 MG/ML IV SOLN
INTRAVENOUS | Status: DC | PRN
  Administered 2021-11-15 (×2): 10 mg via INTRAVENOUS

## 2021-11-15 MED ORDER — OXYCODONE HCL 5 MG PO TABS
5.0000 mg | ORAL_TABLET | ORAL | Status: DC | PRN
  Administered 2021-11-15 – 2021-11-16 (×3): 5 mg via ORAL
  Filled 2021-11-15 (×4): qty 1

## 2021-11-15 MED ORDER — AMISULPRIDE (ANTIEMETIC) 5 MG/2ML IV SOLN: 10.0000 mg | Freq: Once | INTRAVENOUS | Status: DC | PRN

## 2021-11-15 MED ORDER — PHENYLEPHRINE HCL (PRESSORS) 10 MG/ML IV SOLN
INTRAVENOUS | Status: AC
Start: 2021-11-15 — End: ?
  Filled 2021-11-15: qty 1

## 2021-11-15 MED ORDER — PRAMIPEXOLE DIHYDROCHLORIDE 0.25 MG PO TABS
0.5000 mg | ORAL_TABLET | Freq: Every day | ORAL | Status: DC
  Administered 2021-11-15: 0.5 mg via ORAL
  Filled 2021-11-15: qty 2

## 2021-11-15 MED ORDER — DIPHENHYDRAMINE HCL 12.5 MG/5ML PO ELIX
12.5000 mg | ORAL_SOLUTION | ORAL | Status: DC | PRN
  Administered 2021-11-15: 25 mg via ORAL
  Filled 2021-11-15: qty 10

## 2021-11-15 MED ORDER — CHLORHEXIDINE GLUCONATE 0.12 % MT SOLN
15.0000 mL | Freq: Once | OROMUCOSAL | Status: AC
  Administered 2021-11-15: 15 mL via OROMUCOSAL

## 2021-11-15 MED ORDER — OXYCODONE HCL 5 MG PO TABS: 10.0000 mg | ORAL_TABLET | ORAL | Status: DC | PRN

## 2021-11-15 MED ORDER — ONDANSETRON HCL 4 MG/2ML IJ SOLN: 4.0000 mg | Freq: Four times a day (QID) | INTRAMUSCULAR | Status: DC | PRN

## 2021-11-15 MED ORDER — ATENOLOL-CHLORTHALIDONE 100-25 MG PO TABS: 0.5000 | ORAL_TABLET | Freq: Every day | ORAL | Status: DC

## 2021-11-15 MED ORDER — METOCLOPRAMIDE HCL 5 MG/ML IJ SOLN: 5.0000 mg | Freq: Three times a day (TID) | INTRAMUSCULAR | Status: DC | PRN

## 2021-11-15 MED ORDER — ROCURONIUM BROMIDE 10 MG/ML (PF) SYRINGE
PREFILLED_SYRINGE | INTRAVENOUS | Status: DC | PRN
  Administered 2021-11-15: 60 mg via INTRAVENOUS

## 2021-11-15 MED ORDER — ATENOLOL 50 MG PO TABS
50.0000 mg | ORAL_TABLET | Freq: Every day | ORAL | Status: DC
  Administered 2021-11-16: 50 mg via ORAL
  Filled 2021-11-15: qty 1

## 2021-11-15 MED ORDER — KETOROLAC TROMETHAMINE 30 MG/ML IJ SOLN
INTRAMUSCULAR | Status: AC
  Filled 2021-11-15: qty 1

## 2021-11-15 MED ORDER — TRANEXAMIC ACID-NACL 1000-0.7 MG/100ML-% IV SOLN
1000.0000 mg | INTRAVENOUS | Status: AC
Start: 2021-11-15 — End: 2021-11-15
  Administered 2021-11-15: 1000 mg via INTRAVENOUS
  Filled 2021-11-15: qty 100

## 2021-11-15 MED ORDER — ACETAMINOPHEN 325 MG PO TABS
325.0000 mg | ORAL_TABLET | Freq: Four times a day (QID) | ORAL | Status: DC | PRN
  Administered 2021-11-15 – 2021-11-16 (×2): 650 mg via ORAL
  Filled 2021-11-15 (×2): qty 2

## 2021-11-15 MED ORDER — MENTHOL 3 MG MT LOZG: 1.0000 | LOZENGE | OROMUCOSAL | Status: DC | PRN

## 2021-11-15 MED ORDER — STERILE WATER FOR IRRIGATION IR SOLN
Status: DC | PRN
  Administered 2021-11-15: 2000 mL

## 2021-11-15 MED ORDER — METHOCARBAMOL 500 MG IVPB - SIMPLE MED
INTRAVENOUS | Status: AC
  Filled 2021-11-15: qty 55

## 2021-11-15 MED ORDER — LACTATED RINGERS IV SOLN: INTRAVENOUS | Status: DC

## 2021-11-15 MED ORDER — OXYCODONE HCL 5 MG PO TABS
ORAL_TABLET | ORAL | Status: AC
  Filled 2021-11-15: qty 1

## 2021-11-15 MED ORDER — METHOCARBAMOL 500 MG PO TABS
500.0000 mg | ORAL_TABLET | Freq: Four times a day (QID) | ORAL | Status: DC | PRN
  Administered 2021-11-15 – 2021-11-16 (×2): 500 mg via ORAL
  Filled 2021-11-15 (×2): qty 1

## 2021-11-15 MED ORDER — FENTANYL CITRATE PF 50 MCG/ML IJ SOSY
PREFILLED_SYRINGE | INTRAMUSCULAR | Status: AC
  Filled 2021-11-15: qty 1

## 2021-11-15 MED ORDER — FENTANYL CITRATE PF 50 MCG/ML IJ SOSY
50.0000 ug | PREFILLED_SYRINGE | INTRAMUSCULAR | Status: DC
  Administered 2021-11-15: 50 ug via INTRAVENOUS
  Filled 2021-11-15: qty 2

## 2021-11-15 MED ORDER — POVIDONE-IODINE 10 % EX SOLN
CUTANEOUS | Status: DC | PRN
Start: 2021-11-15 — End: 2021-11-15

## 2021-11-15 MED ORDER — FENTANYL CITRATE (PF) 100 MCG/2ML IJ SOLN
INTRAMUSCULAR | Status: AC
  Filled 2021-11-15: qty 2

## 2021-11-15 MED ORDER — DOCUSATE SODIUM 100 MG PO CAPS
100.0000 mg | ORAL_CAPSULE | Freq: Two times a day (BID) | ORAL | Status: DC
  Administered 2021-11-15 – 2021-11-16 (×2): 100 mg via ORAL
  Filled 2021-11-15 (×2): qty 1

## 2021-11-15 MED ORDER — ORAL CARE MOUTH RINSE: 15.0000 mL | Freq: Once | OROMUCOSAL | Status: AC

## 2021-11-15 MED ORDER — DEXAMETHASONE SODIUM PHOSPHATE 10 MG/ML IJ SOLN
INTRAMUSCULAR | Status: DC | PRN
  Administered 2021-11-15: 10 mg via INTRAVENOUS

## 2021-11-15 MED ORDER — SUGAMMADEX SODIUM 200 MG/2ML IV SOLN
INTRAVENOUS | Status: DC | PRN
  Administered 2021-11-15: 200 mg via INTRAVENOUS

## 2021-11-15 MED ORDER — OXYCODONE HCL 5 MG/5ML PO SOLN: 5.0000 mg | Freq: Once | ORAL | Status: AC | PRN

## 2021-11-15 MED ORDER — MIDAZOLAM HCL 2 MG/2ML IJ SOLN
1.0000 mg | INTRAMUSCULAR | Status: DC
  Filled 2021-11-15: qty 2

## 2021-11-15 MED ORDER — ACETAMINOPHEN 500 MG PO TABS
1000.0000 mg | ORAL_TABLET | Freq: Once | ORAL | Status: AC
  Administered 2021-11-15: 500 mg via ORAL
  Filled 2021-11-15: qty 2

## 2021-11-15 MED ORDER — METOCLOPRAMIDE HCL 5 MG PO TABS: 5.0000 mg | ORAL_TABLET | Freq: Three times a day (TID) | ORAL | Status: DC | PRN

## 2021-11-15 MED ORDER — TRANEXAMIC ACID 1000 MG/10ML IV SOLN: 1000.0000 mg | INTRAVENOUS | Status: DC

## 2021-11-15 MED ORDER — BUPIVACAINE LIPOSOME 1.3 % IJ SUSP
INTRAMUSCULAR | Status: DC | PRN
  Administered 2021-11-15: 10 mL via PERINEURAL

## 2021-11-15 MED ORDER — FENTANYL CITRATE PF 50 MCG/ML IJ SOSY
PREFILLED_SYRINGE | INTRAMUSCULAR | Status: AC
  Filled 2021-11-15: qty 2

## 2021-11-15 MED ORDER — PHENYLEPHRINE HCL-NACL 20-0.9 MG/250ML-% IV SOLN
INTRAVENOUS | Status: DC | PRN
  Administered 2021-11-15: 40 ug/min via INTRAVENOUS

## 2021-11-15 MED ORDER — ONDANSETRON HCL 4 MG PO TABS: 4.0000 mg | ORAL_TABLET | Freq: Four times a day (QID) | ORAL | Status: DC | PRN

## 2021-11-15 MED ORDER — ONDANSETRON HCL 4 MG/2ML IJ SOLN
INTRAMUSCULAR | Status: DC | PRN
  Administered 2021-11-15: 4 mg via INTRAVENOUS

## 2021-11-15 MED ORDER — BUPIVACAINE-EPINEPHRINE (PF) 0.5% -1:200000 IJ SOLN
INTRAMUSCULAR | Status: DC | PRN
  Administered 2021-11-15: 15 mL via PERINEURAL

## 2021-11-15 MED ORDER — PROPOFOL 10 MG/ML IV BOLUS
INTRAVENOUS | Status: DC | PRN
  Administered 2021-11-15: 110 mg via INTRAVENOUS

## 2021-11-15 SURGICAL SUPPLY — 80 items
BAG COUNTER SPONGE SURGICOUNT (BAG) ×1 IMPLANT
BAG ZIPLOCK 12X15 (MISCELLANEOUS) ×2 IMPLANT
BLADE SAW SGTL 83.5X18.5 (BLADE) IMPLANT
COOLER ICEMAN CLASSIC (MISCELLANEOUS) ×1 IMPLANT
COVER BACK TABLE 60X90IN (DRAPES) ×2 IMPLANT
COVER SURGICAL LIGHT HANDLE (MISCELLANEOUS) ×2 IMPLANT
CUP SUT UNIV REVERS 36 NEUTRAL (Cup) ×1 IMPLANT
DERMABOND ADVANCED (GAUZE/BANDAGES/DRESSINGS) ×1
DERMABOND ADVANCED .7 DNX12 (GAUZE/BANDAGES/DRESSINGS) ×1 IMPLANT
DRAPE INCISE IOBAN 66X45 STRL (DRAPES) ×1 IMPLANT
DRAPE ORTHO SPLIT 77X108 STRL (DRAPES) ×4
DRAPE SHEET LG 3/4 BI-LAMINATE (DRAPES) ×2 IMPLANT
DRAPE SURG 17X11 SM STRL (DRAPES) ×2 IMPLANT
DRAPE SURG ORHT 6 SPLT 77X108 (DRAPES) ×2 IMPLANT
DRAPE U-SHAPE 47X51 STRL (DRAPES) ×2 IMPLANT
DRESSING PEEL AND PLC PRVNA 13 (GAUZE/BANDAGES/DRESSINGS) IMPLANT
DRSG AQUACEL AG ADV 3.5X 6 (GAUZE/BANDAGES/DRESSINGS) ×1 IMPLANT
DRSG AQUACEL AG ADV 3.5X10 (GAUZE/BANDAGES/DRESSINGS) ×2 IMPLANT
DRSG PEEL AND PLACE PREVENA 13 (GAUZE/BANDAGES/DRESSINGS) ×2
DRSG TEGADERM 8X12 (GAUZE/BANDAGES/DRESSINGS) ×2 IMPLANT
DURAPREP 26ML APPLICATOR (WOUND CARE) ×2 IMPLANT
ELECT BLADE TIP CTD 4 INCH (ELECTRODE) ×2 IMPLANT
ELECT PENCIL ROCKER SW 15FT (MISCELLANEOUS) ×2 IMPLANT
ELECT REM PT RETURN 15FT ADLT (MISCELLANEOUS) ×2 IMPLANT
FACESHIELD WRAPAROUND (MASK) ×6 IMPLANT
FACESHIELD WRAPAROUND OR TEAM (MASK) ×3 IMPLANT
FIBERTAPE CERCLAGE TLINK SUT (SUTURE) ×1 IMPLANT
GLENOID UNI REV MOD 24 +2 LAT (Joint) ×1 IMPLANT
GLENOSPHERE 36 +4 LAT/24 (Joint) ×1 IMPLANT
GLOVE BIO SURGEON STRL SZ7 (GLOVE) ×2 IMPLANT
GLOVE BIO SURGEON STRL SZ7.5 (GLOVE) ×2 IMPLANT
GLOVE BIO SURGEON STRL SZ8 (GLOVE) ×2 IMPLANT
GLOVE BIOGEL PI IND STRL 7.0 (GLOVE) ×1 IMPLANT
GLOVE BIOGEL PI IND STRL 8 (GLOVE) ×1 IMPLANT
GLOVE BIOGEL PI INDICATOR 7.0 (GLOVE) ×1
GLOVE BIOGEL PI INDICATOR 8 (GLOVE) ×1
GOWN STRL REIN XL XLG (GOWN DISPOSABLE) ×4 IMPLANT
HANDPIECE INTERPULSE COAX TIP (DISPOSABLE) ×2
KIT BASIN OR (CUSTOM PROCEDURE TRAY) ×2 IMPLANT
KIT PREVENA INCISION MGT20CM45 (CANNISTER) ×1 IMPLANT
KIT TURNOVER KIT A (KITS) ×1 IMPLANT
LINER HUMERAL 36 +3MM SM (Shoulder) ×1 IMPLANT
MANIFOLD NEPTUNE II (INSTRUMENTS) ×2 IMPLANT
NDL TAPERED W/ NITINOL LOOP (MISCELLANEOUS) IMPLANT
NEEDLE TAPERED W/ NITINOL LOOP (MISCELLANEOUS) IMPLANT
NS IRRIG 1000ML POUR BTL (IV SOLUTION) ×2 IMPLANT
PACK SHOULDER (CUSTOM PROCEDURE TRAY) ×2 IMPLANT
PAD ARMBOARD 7.5X6 YLW CONV (MISCELLANEOUS) ×4 IMPLANT
PAD COLD SHLDR WRAP-ON (PAD) ×1 IMPLANT
PAD ORTHO SHOULDER 7X19 LRG (SOFTGOODS) ×1 IMPLANT
PROTECTOR NERVE ULNAR (MISCELLANEOUS) ×2 IMPLANT
RESTRAINT HEAD UNIVERSAL NS (MISCELLANEOUS) ×2 IMPLANT
SCREW CENTRAL MOD 30MM (Screw) ×1 IMPLANT
SCREW PERI LOCK 5.5X24 (Screw) ×1 IMPLANT
SCREW PERI LOCK 5.5X32 (Screw) ×2 IMPLANT
SCREW PERIPHERAL 5.5X20 LOCK (Screw) ×1 IMPLANT
SET HNDPC FAN SPRY TIP SCT (DISPOSABLE) IMPLANT
SLING ARM FOAM STRAP LRG (SOFTGOODS) IMPLANT
SLING ARM FOAM STRAP MED (SOFTGOODS) ×1 IMPLANT
SMARTMIX MINI TOWER (MISCELLANEOUS)
SPONGE T-LAP 18X18 ~~LOC~~+RFID (SPONGE) ×1 IMPLANT
SPONGE T-LAP 4X18 ~~LOC~~+RFID (SPONGE) IMPLANT
STEM HUMERAL MOD SZ 5 135 DEG (Stem) ×1 IMPLANT
SUCTION FRAZIER HANDLE 12FR (TUBING) ×2
SUCTION TUBE FRAZIER 12FR DISP (TUBING) ×1 IMPLANT
SUT ETHILON 2 0 PS N (SUTURE) ×2 IMPLANT
SUT MNCRL AB 3-0 PS2 18 (SUTURE) ×2 IMPLANT
SUT MON AB 2-0 CT1 36 (SUTURE) ×2 IMPLANT
SUT VIC AB 1 CT1 36 (SUTURE) ×2 IMPLANT
SUT VIC AB 2-0 CT1 27 (SUTURE) ×2
SUT VIC AB 2-0 CT1 TAPERPNT 27 (SUTURE) ×1 IMPLANT
SUTURE TAPE 1.3 40 TPR END (SUTURE) IMPLANT
SUTURETAPE 1.3 40 TPR END (SUTURE)
SWAB COLLECTION DEVICE MRSA (MISCELLANEOUS) IMPLANT
SWAB CULTURE ESWAB REG 1ML (MISCELLANEOUS) IMPLANT
TOWEL OR 17X26 10 PK STRL BLUE (TOWEL DISPOSABLE) ×2 IMPLANT
TOWEL OR NON WOVEN STRL DISP B (DISPOSABLE) ×2 IMPLANT
TOWER SMARTMIX MINI (MISCELLANEOUS) IMPLANT
WATER STERILE IRR 1000ML POUR (IV SOLUTION) ×2 IMPLANT
YANKAUER SUCT BULB TIP 10FT TU (MISCELLANEOUS) ×2 IMPLANT

## 2021-11-15 NOTE — H&P (Signed)
Natalie [name]    Chief Complaint: Right shoulder retained cement spacer HPI: The patient is a 76 y.o. female well-known to our practice with an index right shoulder reverse arthroplasty performed in May 2022.  Unfortunately she had continued complaints of significant right shoulder pain after surgery which was unrelenting.  She underwent work-up for possible loosening versus infection x2 all of which showed essentially normal studies.  She was ultimately taken back to surgery in March of this year where she underwent an explantation of her prosthesis with the placement of an antibiotic impregnated cement spacer.  Of her multiple cultures 1 grew out and almost 3 weeks postop for C acnes.  She has subsequently been treated with a course of IV as well as p.o. antibiotics under the guidance of the infectious disease service.  At this time all of her inflammatory parameters have normalized.  The incision is quiescent.  Radiographs show stable alignment.  She is now brought to the operating room for planned reimplantation of a reverse shoulder prosthesis.    Past Medical History:  Diagnosis Date   Anemia    Arthritis    Back pain    Cancer (HCC)    Change in hearing    Generalized headaches    GERD (gastroesophageal reflux disease)    Heart murmur    Pt denies   Hyperlipidemia    Hypertension    Joint pain    Leg pain    Osteopenia 04/2015   T score -1.9 FRAX 12%/2.6% stable from prior DEXA   Pneumonia    PONV (postoperative nausea and vomiting)    Skin cancer    Varicose veins of leg with pain Left  > Right      Past Surgical History:  Procedure Laterality Date   ABDOMINAL HYSTERECTOMY  1982   TAH BSO   arm surgery  1998 right arm   CATARACT EXTRACTION Bilateral    CESAREAN SECTION     ECTOPIC PREGNANCY SURGERY     ENDOVENOUS ABLATION SAPHENOUS VEIN W/ LASER  left leg 10 years ago per pt.   EXCISIONAL TOTAL SHOULDER ARTHROPLASTY WITH ANTIBIOTIC SPACER Right 07/12/2021    Procedure: Removal Right Reverse shoulder arthroplasty and placement of antibiotic spacer;  Surgeon: Justice Britain, MD;  Location: [facility];  Service: Orthopedics;  Laterality: Right;  119mn   EXPLORATORY TYMPANOTOMY     FINGER SURGERY  2007  right hand 4th and 5th digits   REVERSE SHOULDER ARTHROPLASTY Right 09/07/2020   Procedure: REVERSE SHOULDER ARTHROPLASTY;  Surgeon: SJustice Britain MD;  Location: [facility];  Service: Orthopedics;  Laterality: Right;  124m   SHOULDER SURGERY  2019   STAPEDECTOMY  2001   STAPEDECTOMY  revision 2002   TONSILLECTOMY  1954   VEIN LIGATION AND STRIPPING  12/2010   LEFT- RIGHT LEG INJECTED FOR SPIDER VEINS    Family History  Problem Relation Age of Onset   Heart disease Mother    Hypertension Mother    Heart disease Father    Hypertension Father    Diabetes Daughter    Heart Problems Daughter     Social History:  reports that she has never smoked. She has never used smokeless tobacco. She reports that she does not drink alcohol and does not use drugs.  BMI: Estimated body mass index is 28.06 kg/m as calculated from the following:   Height as of 11/07/21: '5\' 2"'$  (1.575 m).   Weight as of 11/07/21: 69.6 kg.  Lab Results  Component  Value Date   ALBUMIN 3.3 (L) 08/01/2021   Diabetes: Patient does not have a diagnosis of diabetes.     Smoking Status:   reports that she has never smoked. She has never used smokeless tobacco.     Medications Prior to Admission  Medication Sig Dispense Refill   acetaminophen (TYLENOL) 500 MG tablet Take 1,000 mg by mouth every 6 (six) hours as needed for moderate pain.     aspirin 81 MG tablet Take 81 mg by mouth daily.     atenolol-chlorthalidone (TENORETIC) 100-25 MG per tablet Take 0.5 tablets by mouth daily.     atorvastatin (LIPITOR) 20 MG tablet Take 20 mg by mouth at bedtime.     Cholecalciferol (VITAMIN D) 125 MCG (5000 UT) CAPS Take 5,000 Units by mouth daily.     diclofenac Sodium (VOLTAREN) 1 % GEL  Apply 1 Application topically 4 (four) times daily as needed (pain).     hydroxypropyl methylcellulose / hypromellose (ISOPTO TEARS / GONIOVISC) 2.5 % ophthalmic solution Place 1 drop into both eyes as needed for dry eyes.     ibuprofen (ADVIL) 200 MG tablet Take 400 mg by mouth every 6 (six) hours as needed for fever or moderate pain.     meloxicam (MOBIC) 15 MG tablet Take 1 tablet (15 mg total) by mouth at bedtime. (Patient taking differently: Take 15 mg by mouth daily.) 30 tablet 1   Menthol, Topical Analgesic, (ASPERCREME MAX ROLL-ON EX) Apply 1 Application topically daily as needed (pain).     Multiple Vitamins-Minerals (HAIR SKIN AND NAILS FORMULA) TABS Take 3 tablets by mouth every other day.     omeprazole (PRILOSEC) 20 MG capsule TAKE 1 CAPSULE (20 MG TOTAL) TWO TIMES DAILY. 180 capsule 0   ondansetron (ZOFRAN) 4 MG tablet Take 1 tablet (4 mg total) by mouth every 8 (eight) hours as needed for nausea or vomiting. 16 tablet 0   OVER THE COUNTER MEDICATION Take 1 tablet by mouth in the morning and at bedtime. golo release otc supplement     pramipexole (MIRAPEX) 0.5 MG tablet Take 0.5 mg by mouth at bedtime.     salicylic acid-lactic acid (LIQUID WART REMOVER) 17 % external solution Apply 1 Application topically daily as needed (warts).     losartan (COZAAR) 50 MG tablet Take 50 mg by mouth daily.     methocarbamol (ROBAXIN) 500 MG tablet Take 1 tablet (500 mg total) by mouth 4 (four) times daily. (Patient not taking: Reported on 11/02/2021) 40 tablet 0   oxyCODONE-acetaminophen (PERCOCET) 5-325 MG tablet Take 1 tablet by mouth every 4 (four) hours as needed (max 6 q). (Patient not taking: Reported on 11/02/2021) 30 tablet 0   triamcinolone (NASACORT ALLERGY 24HR) 55 MCG/ACT AERO nasal inhaler Place 1 spray into the nose daily as needed (congestion).       Physical Exam: Inspection of the right shoulder demonstrates that her surgical incision is well-healed.  There is no erythema or  induration.  Very minimal tenderness.  She does describe discomfort which she reports is a "pinching sensation" in the axilla.  No obvious erythema or masses noted.  She remains neurovascular intact in the right upper extremity.  Current radiographs confirm a cement spacer hemiarthroplasty in overall good position alignment.  Vitals  Temp:  [97.5 F (36.4 C)] 97.5 F (36.4 C) (07/27 0808) Pulse Rate:  [68] 68 (07/27 0808) Resp:  [15] 15 (07/27 0808) BP: (179)/(78) 179/78 (07/27 0808) SpO2:  [98 %] 98 % (07/27  0808)  Assessment/Plan  Impression: Right shoulder retained cement spacer  Plan of Action: Procedure(s): Removal right shoulder cement spacer and reimplantation reverse prosthesis  Natalie [name] 11/15/2021, 9:09 AM Contact # [phone]

## 2021-11-15 NOTE — Plan of Care (Signed)
  Problem: Activity: Goal: Risk for activity intolerance will decrease Outcome: Progressing   Problem: Pain Managment: Goal: General experience of comfort will improve Outcome: Progressing   Problem: Safety: Goal: Ability to remain free from injury will improve Outcome: Progressing   

## 2021-11-15 NOTE — Anesthesia Procedure Notes (Signed)
Anesthesia Regional Block: Interscalene brachial plexus block   Pre-Anesthetic Checklist: , timeout performed,  Correct Patient, Correct Site, Correct Laterality,  Correct Procedure, Correct Position, site marked,  Risks and benefits discussed,  Surgical consent,  Pre-op evaluation,  At surgeon's request and post-op pain management  Laterality: Right  Prep: chloraprep       Needles:  Injection technique: Single-shot  Needle Type: Echogenic Stimulator Needle     Needle Length: 9cm  Needle Gauge: 21     Additional Needles:   Procedures:, nerve stimulator,,, ultrasound used (permanent image in chart),,     Nerve Stimulator or Paresthesia:  Response: deltoid and bicep, 0.6 mA  Additional Responses:   Narrative:  Start time: 11/15/2021 9:15 AM End time: 11/15/2021 9:21 AM Injection made incrementally with aspirations every 5 mL.  Performed by: Personally  Anesthesiologist: Suzette Battiest, MD

## 2021-11-15 NOTE — Anesthesia Preprocedure Evaluation (Signed)
Anesthesia Evaluation  Patient identified by MRN, date of birth, ID band Patient awake    Reviewed: Allergy & Precautions, NPO status , Patient's Chart, lab work & pertinent test results  Airway Mallampati: II  TM Distance: >3 FB Neck ROM: Full    Dental  (+) Dental Advisory Given   Pulmonary neg pulmonary ROS,    breath sounds clear to auscultation       Cardiovascular hypertension, Pt. on medications and Pt. on home beta blockers  Rhythm:Regular Rate:Normal     Neuro/Psych negative neurological ROS     GI/Hepatic Neg liver ROS, GERD  ,  Endo/Other  negative endocrine ROS  Renal/GU negative Renal ROS     Musculoskeletal  (+) Arthritis ,   Abdominal   Peds  Hematology  (+) Blood dyscrasia, anemia ,   Anesthesia Other Findings   Reproductive/Obstetrics                             Lab Results  Component Value Date   WBC 5.8 11/07/2021   HGB 10.8 (L) 11/07/2021   HCT 33.1 (L) 11/07/2021   MCV 89.7 11/07/2021   PLT 238 11/07/2021   Lab Results  Component Value Date   CREATININE 0.90 11/07/2021   BUN 27 (H) 11/07/2021   NA 137 11/07/2021   K 3.7 11/07/2021   CL 101 11/07/2021   CO2 26 11/07/2021    Anesthesia Physical Anesthesia Plan  ASA: 2  Anesthesia Plan: General   Post-op Pain Management: Regional block* and Tylenol PO (pre-op)*   Induction: Intravenous  PONV Risk Score and Plan: 3 and Dexamethasone, Ondansetron and Treatment may vary due to age or medical condition  Airway Management Planned: Oral ETT  Additional Equipment: None  Intra-op Plan:   Post-operative Plan: Extubation in OR  Informed Consent: I have reviewed the patients History and Physical, chart, labs and discussed the procedure including the risks, benefits and alternatives for the proposed anesthesia with the patient or authorized representative who has indicated his/her understanding and  acceptance.     Dental advisory given  Plan Discussed with:   Anesthesia Plan Comments:         Anesthesia Quick Evaluation

## 2021-11-15 NOTE — Op Note (Addendum)
11/15/2021  11:51 AM  PATIENT:   Natalie [name]  76 y.o. female  PRE-OPERATIVE DIAGNOSIS:  Right shoulder retained cement spacer  POST-OPERATIVE DIAGNOSIS: Same  PROCEDURE: Removal of right shoulder cement spacer in the reimplantation of a reverse shoulder prosthesis utilizing a size 5 modular Arthrex stem, press-fit, neutral metaphysis, +3 polyethylene insert, 36/+4 glenosphere and a small/+2 baseplate  SURGEON:  Amador Braddy, Metta Clines M.D.  ASSISTANTS: Jenetta Loges, PA-C  ANESTHESIA:   General endotracheal and interscalene block with Exparel  EBL: 250 cc  SPECIMEN: None  Drains: None   PATIENT DISPOSITION:  PACU - hemodynamically stable.    PLAN OF CARE: Admit for overnight observation  Brief history  Patient is a 76 year old female well-known to our practice status post an index right shoulder reverse arthroplasty performed in May 2022.  She unfortunately had continued shoulder pain postoperatively with an inconclusive diagnostic work-up for infection versus loosening.  Due to her continued complaints of severe shoulder pain she was taken back to surgery in March of this year for an explantation of her prosthesis with the implantation of an antibiotic impregnated cement spacer.  One of her multiple cultures did ultimately grow out C acnes at approximately 3 weeks after surgery.  She has completed a course of IV and then p.o. antibiotics per the guidance of the infectious disease service.  Her inflammatory parameters have all normalized and at this time she is brought to the operating for planned reimplantation.  Preoperatively I counseled Natalie [name] regarding the treatment options as well as the potential risks versus benefits thereof.  Possible surgical complications were reviewed including the potential for bleeding, infection, neurovascular injury, persistence of pain, loss of motion, failure of the implant, anesthetic complication, and possible need for additional surgery.   She understands, and accepts, and agrees with our planned procedure.  Procedure detail:  After undergoing routine preop evaluation patient received prophylactic antibiotics in the form of vancomycin.  Brought to the operating room placed spine on the operating table and underwent the smooth induction of a general endotracheal anesthesia.  Placed into the beachchair position and appropriately padded and protected.  The right shoulder girdle region was sterilely prepped and draped in standard fashion.  Timeout was called.  A deltopectoral approach to the right shoulder is made through an approximately 10 cm incision.  This was performed along her previous incision.  Skin flaps were elevated and dissection carried deeply and the deltopectoral interval was then identified and developed with electrocautery since that was completely scarred fied related to her prior surgeries.  Dissection carried beneath the deltoid over the cement spacer superiorly and laterally and then extended down onto the humeral shaft.  We then dissected medially mobilized the conjoined tendon and then dissected soft tissue from the medial calcar region.  Once the metaphysis was freed of soft tissue attachments were able to deliver the cement spacer through the wound.  The bone cement interface was then identified and gently debrided with a rondure as well as a osteotome and ultimately the cement stem was removed with this entire sleeve of surrounding cement in 1 piece.  We then vigorously curetted the humeral metaphysis removing any fibrous membrane and debris.  We then placed a metal stem with protective Into the humeral canal to protect the metaphyseal region we then performed exposure of the glenoid performing a circumferential soft tissue dissection at the margin of the glenoid removing the fibrous membrane and soft tissue envelope that had developed across the glenoid and  around its margins such that we could completely visualize the  glenoid and had appropriate soft tissue dissection and visualization.  The glenoid was then curetted.  A guidepin was then directed into the center of the glenoid following the previous central screw and this was then reamed with a central followed by peripheral reamer for a small glenoid baseplate and a 36 head.  Preparation completed with the central drill and tap for a 30 mm lag screw.  Our baseplate was then assembled and then inserted after vancomycin powder was applied to the threads of the lag screw the baseplate was inserted with good purchase and fixation.  The peripheral locking screws were all then placed and these were all introduced through virgin bone each achieving good purchase and fixation and these were in place using standard technique with good fixation.  A 36/+4 glenosphere was then impacted onto the baseplate and the central locking screw was placed.  We then returned our attention to the humeral metaphysis where the canal protector was removed.  We then performed hand reaming of the canal to remove soft tissue debris.  We then broached and after several trials ultimately selected a size 5 stem which allowed [place] to seat the implant deep enough such that we could achieve a successful reduction.  Good fixation was achieved.  Upon completion the trial was then removed and the canal was pulsatile he Avage irrigated cleaned and dried vancomycin powder was spread into the canal.  Our size 5 modular implant was then assembled and it was then impacted at approximate 20 degrees retroversion.  I should mention we did place a fiber cerclage around the humeral metaphysis to protect the proximal humeral bone during seating of the implant in good fixation was achieved.  Reduction was then performed and this showed good soft tissue balance good motion and stability with a +3 polyethylene insert.  At this point we terminally secured our fiber tape cerclage the inflow was cleaned and dried and a +3 poly was then  impacted.  Final reduction was then performed showing good motion good stability good soft tissue balance.  The joint was then copes irrigated.  Hemostasis was obtained.  She did have some generalized oozing of blood from the multiple dissection planes and so we did elect to utilize a Prevena vacuum-assisted closure device.  The deltopectoral interval was reapproximated with a series of figure-of-eight number Vicryl sutures.  2-0 Monocryl used to close the subcu layer and 2-0 nylon used to close the skin in vertical mattress sutures.  The Prevena dressing was then applied and the right arm was then placed into a sling and the patient was awakened, extubated, and taken to the recovery room in stable condition.  Jenetta Loges, PA-C was utilized as an Environmental consultant throughout this case, essential for help with positioning the patient, positioning extremity, tissue manipulation, implantation of the prosthesis, suture management, wound closure, and intraoperative decision-making.  Post op xray shows overall good position and alignment of the implants  Marin Shutter MD   Contact # [phone]

## 2021-11-15 NOTE — Anesthesia Postprocedure Evaluation (Signed)
Anesthesia Post Note  Patient: Waylynn Benefiel  Procedure(s) Performed: Removal right shoulder cement spacer and reimplantation reverse prosthesis (Right: Shoulder)     Patient location during evaluation: PACU Anesthesia Type: General Level of consciousness: awake and alert Pain management: pain level controlled Vital Signs Assessment: post-procedure vital signs reviewed and stable Respiratory status: spontaneous breathing, nonlabored ventilation, respiratory function stable and patient connected to nasal cannula oxygen Cardiovascular status: blood pressure returned to baseline and stable Postop Assessment: no apparent nausea or vomiting Anesthetic complications: no   No notable events documented.  Last Vitals:  Vitals:   11/15/21 1345 11/15/21 1404  BP: 140/77 (!) 142/60  Pulse: 79 70  Resp: 18 16  Temp:  36.4 C  SpO2: 97% 96%    Last Pain:  Vitals:   11/15/21 1404  TempSrc: Oral  PainSc:                  Tiajuana Amass

## 2021-11-15 NOTE — Anesthesia Procedure Notes (Signed)
Procedure Name: Intubation Date/Time: 11/15/2021 10:06 AM  Performed by: Pilar Grammes, CRNAPre-anesthesia Checklist: Patient identified, Emergency Drugs available, Suction available, Patient being monitored and Timeout performed Patient Re-evaluated:Patient Re-evaluated prior to induction Oxygen Delivery Method: Circle system utilized Preoxygenation: Pre-oxygenation with 100% oxygen Induction Type: IV induction Ventilation: Mask ventilation without difficulty Laryngoscope Size: Miller and 2 Grade View: Grade I Tube type: Oral Tube size: 7.5 mm Number of attempts: 1 Airway Equipment and Method: Stylet Placement Confirmation: positive ETCO2, ETT inserted through vocal cords under direct vision, CO2 detector and breath sounds checked- equal and bilateral Secured at: 22 cm Tube secured with: Tape Dental Injury: Teeth and Oropharynx as per pre-operative assessment

## 2021-11-15 NOTE — Transfer of Care (Signed)
Immediate Anesthesia Transfer of Care Note  Patient: Natalie [name]  Procedure(s) Performed: Removal right shoulder cement spacer and reimplantation reverse prosthesis (Right: Shoulder)  Patient Location: PACU  Anesthesia Type:General and GA combined with regional for post-op pain  Level of Consciousness: awake, drowsy and patient cooperative  Airway & Oxygen Therapy: Patient Spontanous Breathing and Patient connected to face mask oxygen  Post-op Assessment: Report given to RN and Post -op Vital signs reviewed and stable  Post vital signs: stable  Last Vitals:  Vitals Value Taken Time  BP 175/84 11/15/21 1215  Temp    Pulse 76 11/15/21 1216  Resp 17 11/15/21 1216  SpO2 100 % 11/15/21 1216  Vitals shown include unvalidated device data.  Last Pain:  Vitals:   11/15/21 0924  TempSrc:   PainSc: 4          Complications: No notable events documented.

## 2021-11-16 MED ORDER — ONDANSETRON HCL 4 MG PO TABS: 4.0000 mg | ORAL_TABLET | Freq: Three times a day (TID) | ORAL | 0 refills | Status: DC | PRN

## 2021-11-16 MED ORDER — OXYCODONE-ACETAMINOPHEN 5-325 MG PO TABS: 1.0000 | ORAL_TABLET | ORAL | 0 refills | Status: DC | PRN

## 2021-11-16 MED ORDER — METHOCARBAMOL 500 MG PO TABS: 500.0000 mg | ORAL_TABLET | Freq: Four times a day (QID) | ORAL | 1 refills | Status: DC

## 2021-11-16 NOTE — Discharge Summary (Signed)
PATIENT ID:      Natalie [name]  MRN:     [id number] DOB/AGE:    78/24/1947 / 78 y.o.     DISCHARGE SUMMARY  ADMISSION DATE:    11/15/2021 DISCHARGE DATE:  11/16/2021  ADMISSION DIAGNOSIS: Right shoulder retained cement spacer Past Medical History:  Diagnosis Date   Anemia    Arthritis    Back pain    Cancer (HCC)    Change in hearing    Generalized headaches    GERD (gastroesophageal reflux disease)    Heart murmur    Pt denies   Hyperlipidemia    Hypertension    Joint pain    Leg pain    Osteopenia 04/2015   T score -1.9 FRAX 12%/2.6% stable from prior DEXA   Pneumonia    PONV (postoperative nausea and vomiting)    Skin cancer    Varicose veins of leg with pain Left  > Right    DISCHARGE DIAGNOSIS:   Principal Problem:   S/P reverse total shoulder arthroplasty, right   PROCEDURE: Procedure(s): Removal right shoulder cement spacer and reimplantation reverse prosthesis on 11/15/2021  CONSULTS:    HISTORY:  See H&P in chart.  HOSPITAL COURSE:  Natalie [name] is a 76 y.o. admitted on 11/15/2021 with a diagnosis of Right shoulder retained cement spacer.  They were brought to the operating room on 11/15/2021 and underwent Procedure(s): Removal right shoulder cement spacer and reimplantation reverse prosthesis.    They were given perioperative antibiotics:  Anti-infectives (From admission, onward)    Start     Dose/Rate Route Frequency Ordered Stop   11/15/21 1033  vancomycin (VANCOCIN) powder  Status:  Discontinued          As needed 11/15/21 1033 11/15/21 1357   11/15/21 0815  vancomycin (VANCOCIN) IVPB 1000 mg/200 mL premix        1,000 mg 200 mL/hr over 60 Minutes Intravenous On call to O.R. 11/15/21 0813 11/15/21 1004     .  Patient underwent the above named procedure and tolerated it well. The following day they were hemodynamically stable and pain was controlled on oral analgesics. They were neurovascularly intact to the operative extremity. OT was  ordered and worked with patient per protocol. They were medically and orthopaedically stable for discharge on .    DIAGNOSTIC STUDIES:  RECENT RADIOGRAPHIC STUDIES :  DG Shoulder Right Port  Result Date: 11/15/2021 CLINICAL DATA:  Postop right shoulder EXAM: RIGHT SHOULDER - 1 VIEW COMPARISON:  None Available. FINDINGS: Right total reverse shoulder arthroplasty. No acute fracture or dislocation. No aggressive osseous lesion. Normal alignment. Soft tissue are unremarkable. No radiopaque foreign body or soft tissue emphysema. IMPRESSION: 1. Right total reverse shoulder arthroplasty without failure or complication. Electronically Signed   By: Kathreen Devoid M.D.   On: 11/15/2021 12:50    RECENT VITAL SIGNS:  Patient Vitals for the past 24 hrs:  BP Temp Temp src Pulse Resp SpO2 Height Weight  11/16/21 0513 139/63 98.1 F (36.7 C) -- 81 17 96 % -- --  11/16/21 0114 (!) 134/58 97.9 F (36.6 C) Oral 86 17 97 % -- --  11/15/21 2137 (!) 148/65 97.8 F (36.6 C) Oral 96 17 97 % -- --  11/15/21 1602 117/70 97.7 F (36.5 C) Oral 92 -- 96 % -- --  11/15/21 1404 (!) 142/60 97.6 F (36.4 C) Oral 70 16 96 % -- --  11/15/21 1345 140/77 -- -- 79 18 97 % -- --  11/15/21 1330 (!) 140/95 -- -- 65 16 97 % -- --  11/15/21 1315 (!) 141/70 -- -- 72 (!) 23 96 % -- --  11/15/21 1300 (!) 157/74 97.9 F (36.6 C) -- 71 16 96 % -- --  11/15/21 1245 (!) 153/60 -- -- 70 13 100 % -- --  11/15/21 1230 (!) 147/74 -- -- 77 19 90 % -- --  11/15/21 1215 (!) 175/84 97.8 F (36.6 C) -- 75 12 100 % -- --  11/15/21 0924 (!) 162/74 -- -- 72 (!) 22 95 % '5\' 2"'$  (1.575 m) 69 kg  11/15/21 0923 (!) 144/84 -- -- 73 (!) 22 96 % -- --  11/15/21 0918 -- -- -- 73 20 100 % -- --  11/15/21 0913 (!) 165/87 -- -- 69 (!) 22 96 % -- --  11/15/21 0908 (!) 136/122 -- -- 70 19 100 % -- --  11/15/21 0903 105/89 -- -- 68 16 97 % -- --  .  RECENT EKG RESULTS:    Orders placed or performed during the hospital encounter of 07/31/21   ED EKG  12-Lead   ED EKG 12-Lead   EKG    DISCHARGE INSTRUCTIONS:    DISCHARGE MEDICATIONS:   Allergies as of 11/16/2021       Reactions   Daptomycin Other (See Comments)   Eosinophilic pneumonia   Cephalexin    Can not recall reaction   Nitrofurantoin Monohyd Macro Nausea Only   Sulfa Antibiotics Nausea Only        Medication List     STOP taking these medications    hydroxypropyl methylcellulose / hypromellose 2.5 % ophthalmic solution Commonly known as: ISOPTO TEARS / GONIOVISC       TAKE these medications    acetaminophen 500 MG tablet Commonly known as: TYLENOL Take 1,000 mg by mouth every 6 (six) hours as needed for moderate pain.   ASPERCREME MAX ROLL-ON EX Apply 1 Application topically daily as needed (pain).   aspirin 81 MG tablet Take 81 mg by mouth daily.   atenolol-chlorthalidone 100-25 MG tablet Commonly known as: TENORETIC Take 0.5 tablets by mouth daily.   atorvastatin 20 MG tablet Commonly known as: LIPITOR Take 20 mg by mouth at bedtime.   diclofenac Sodium 1 % Gel Commonly known as: VOLTAREN Apply 1 Application topically 4 (four) times daily as needed (pain).   Hair Skin and Nails Formula Tabs Take 3 tablets by mouth every other day.   ibuprofen 200 MG tablet Commonly known as: ADVIL Take 400 mg by mouth every 6 (six) hours as needed for fever or moderate pain.   Liquid Wart Remover 17 % external solution Generic drug: salicylic acid-lactic acid Apply 1 Application topically daily as needed (warts).   losartan 50 MG tablet Commonly known as: COZAAR Take 50 mg by mouth daily.   meloxicam 15 MG tablet Commonly known as: MOBIC Take 1 tablet (15 mg total) by mouth at bedtime. What changed: when to take this   methocarbamol 500 MG tablet Commonly known as: Robaxin Take 1 tablet (500 mg total) by mouth 4 (four) times daily.   Nasacort Allergy 24HR 55 MCG/ACT Aero nasal inhaler Generic drug: triamcinolone Place 1 spray into the  nose daily as needed (congestion).   omeprazole 20 MG capsule Commonly known as: PRILOSEC TAKE 1 CAPSULE (20 MG TOTAL) TWO TIMES DAILY.   ondansetron 4 MG tablet Commonly known as: Zofran Take 1 tablet (4 mg total) by mouth every 8 (eight) hours as  needed for nausea or vomiting. What changed: Another medication with the same name was added. Make sure you understand how and when to take each.   ondansetron 4 MG tablet Commonly known as: Zofran Take 1 tablet (4 mg total) by mouth every 8 (eight) hours as needed for nausea or vomiting. What changed: You were already taking a medication with the same name, and this prescription was added. Make sure you understand how and when to take each.   OVER THE COUNTER MEDICATION Take 1 tablet by mouth in the morning and at bedtime. golo release otc supplement   oxyCODONE-acetaminophen 5-325 MG tablet Commonly known as: Percocet Take 1 tablet by mouth every 4 (four) hours as needed (max 6 q).   pramipexole 0.5 MG tablet Commonly known as: MIRAPEX Take 0.5 mg by mouth at bedtime.   Vitamin D 125 MCG (5000 UT) Caps Take 5,000 Units by mouth daily.        FOLLOW UP VISIT:    Follow-up Information     Justice Britain, MD Follow up.   Specialty: Orthopedic Surgery Why: 11-26-2021 at 10:15 AM for -post-op Contact information: [address] [place] [address] [phone]         Justice Britain, MD Follow up.   Specialty: Orthopedic Surgery Why: follow up next FridayAugust 4 at 2:00 for wound vac removal and dressing change Contact information: [address] [place] [address] [phone]                 DISCHARGE TO: Home   DISCHARGE CONDITION:  Stable     Jenetta Loges for Dr. Justice Britain 11/16/2021, 8:09 AM

## 2021-11-16 NOTE — Progress Notes (Signed)
  Transition of Care [facility]) Screening Note   Patient Details  Name: Natalie [name] Date of Birth: [birth year]   Transition of Care [facility]) CM/SW Contact:    Lennart Pall, LCSW Phone Number: 11/16/2021, 8:51 AM    Transition of Care Department [facility]) has reviewed patient and no TOC needs have been identified at this time. We will continue to monitor patient advancement through interdisciplinary progression rounds. If new patient transition needs arise, please place a TOC consult.

## 2021-11-16 NOTE — Discharge Instructions (Signed)
Metta Clines. Supple, M.D., F.A.A.O.S. Orthopaedic Surgery Specializing in Arthroscopic and Reconstructive Surgery of the Shoulder [phone] [address] [place], [place] [address] - Fax [phone]   POST-OP TOTAL SHOULDER REPLACEMENT INSTRUCTIONS  1. Follow up for dressing change and vac removal next Friday at 2:00  2. Leave preveena vac in place charging it as instructed by nurses at night    3. Wear your sling/immobilizer at all times except to perform the exercises below or to occasionally let your arm dangle by your side to stretch your elbow. You also need to sleep in your sling immobilizer until instructed otherwise. It is ok to remove your sling if you are sitting in a controlled environment and allow your arm to rest in a position of comfort by your side or on your lap with pillows to give your neck and skin a break from the sling. You may remove it to allow arm to dangle by side to shower. If you are up walking around and when you go to sleep at night you need to wear it.  4. Range of motion to your elbow, wrist, and hand are encouraged 3-5 times daily. Exercise to your hand and fingers helps to reduce swelling you may experience.   5. Prescriptions for a pain medication and a muscle relaxant are provided for you. It is recommended that if you are experiencing pain that you pain medication alone is not controlling, add the muscle relaxant along with the pain medication which can give additional pain relief. The first 1-2 days is generally the most severe of your pain and then should gradually decrease. As your pain lessens it is recommended that you decrease your use of the pain medications to an "as needed basis'" only and to always comply with the recommended dosages of the pain medications.  6. Pain medications can produce constipation along with their use. If you experience this, the use of an over the counter stool softener or laxative daily is recommended.   7.  For additional questions or concerns, please do not hesitate to call the office. If after hours there is an answering service to forward your concerns to the physician on call.  8.Pain control following an exparel block  To help control your post-operative pain you received a nerve block  performed with Exparel which is a long acting anesthetic (numbing agent) which can provide pain relief and sensations of numbness (and relief of pain) in the operative shoulder and arm for up to 3 days. Sometimes it provides mixed relief, meaning you may still have numbness in certain areas of the arm but can still be able to move  parts of that arm, hand, and fingers. We recommend that your prescribed pain medications  be used as needed. We do not feel it is necessary to "pre medicate" and "stay ahead" of pain.  Taking narcotic pain medications when you are not having any pain can lead to unnecessary and potentially dangerous side effects.    9. Use the ice machine as much as possible in the first 5-7 days from surgery, then you can wean its use to as needed. The ice typically needs to be replaced every 6 hours, instead of ice you can actually freeze water bottles to put in the cooler and then fill water around them to avoid having to purchase ice. You can have spare water bottles freezing to allow you to rotate them once they have melted. Try to have a thin shirt or  light cloth or towel under the ice wrap to protect your skin.   FOR ADDITIONAL INFO ON ICE MACHINE AND INSTRUCTIONS GO TO THE WEBSITE AT  [email or web address]  10.  We recommend that you avoid any dental work or cleaning in the first 3 months following your joint replacement. This is to help minimize the possibility of infection from the bacteria in your mouth that enters your bloodstream during dental work. We also recommend that you take an antibiotic prior to your dental work for the first year after your  shoulder replacement to further help reduce that risk. Please simply contact our office for antibiotics to be sent to your pharmacy prior to dental work.  11. Dental Antibiotics:  We recommend waiting at least 3 months for any dental work even cleanings unless there is a IT consultant. We also recommend  prophylactic antibiotics for all dental procdeures  the first year following your joint replacement. In some exceptions we recommend them to be used lifelong. We will provide you with that prescription in follow up office visits, or you can call our office.  Exceptions are as follows:  1. History of prior total joint infection  2. Severely immunocompromised (Organ Transplant, cancer chemotherapy, Rheumatoid biologic meds such as Blanchard)  3. Poorly controlled diabetes (A1C &gt; 8.0, blood glucose over 200)   POST-OP EXERCISES  Pendulum Exercises  Perform pendulum exercises while standing and bending at the waist. Support your uninvolved arm on a table or chair and allow your operated arm to hang freely. Make sure to do these exercises passively - not using you shoulder muscles. These exercises can be performed once your nerve block effects have worn off.  Repeat 20 times. Do 3 sessions per day.

## 2021-11-16 NOTE — Progress Notes (Signed)
During last night's shift change report I received from Gus Puma, the patient refused her arm to be elevated on pillows, stating "You don't know what I've been through. Just leave it alone". The patient then asked to be pulled up in bed and I told her I could support her arm up just slightly off the bed so it wouldn't hurt when we pulled her up and she stated "You are not pulling on my arm". I told the patient that I would never pull on her arm or any patient's arm or leg, or any bodily part. The patient then stated that she had previously had a horrible experience with [facility]. She stated during her prior admission she called at night to go to the bathroom and no one came to her room until the morning She stated she had a soaked bed in the morning and staff was horrible. She said "Well I hope it's not that way here". Tu and I assured her that staff are very responsive and attentive to the patient's needs on this unit and if she called someone would be there quickly to attend to her needs. She and her daughter continued to state how terrible her previous admission to [facility] health was and said they knew someone higher up in [facility] and they want to know how her stay in the hospital is this time. Tu asked if it was an admission to this unit previously, and the patient responded no.

## 2021-11-16 NOTE — Plan of Care (Signed)
Pt ready to DC home with family. Pt has Provena wound vac on and has iceman.

## 2021-11-16 NOTE — Evaluation (Signed)
Occupational Therapy Evaluation Patient Details Name: Natalie [name] MRN: [id number] DOB: Oct 02, 1945 Today's Date: 11/16/2021   History of Present Illness The patient is a 76 y.o. female s/p right shoulder reverse arthroplasty performed in May 2022.  Unfortunately she had continued complaints of significant right shoulder pain after surgery which was unrelenting.  She was ultimately taken back to surgery in March of this year where she underwent an explantation of her prosthesis with the placement of an antibiotic impregnated cement spacer. She is now s/p Removal right shoulder cement spacer and reimplantation reverse prosthesis on 11/15/2021   Clinical Impression   Natalie [name] is a 76 year old woman s/p shoulder replacement without functional use of right non-dominant upper extremity secondary to effects of surgery and interscalene block and shoulder precautions. Therapist provided education and instruction to patient, daughter and spouse in regards to exercises, precautions, positioning, donning upper extremity clothing and bathing while maintaining shoulder precautions, ice and edema management and donning/doffing sling. Patient and spouse verbalized understanding and demonstrated as needed. Patient familiar with precautions from prior surgeries. Patient to follow up with MD for further therapy needs.        Recommendations for follow up therapy are one component of a multi-disciplinary discharge planning process, led by the attending physician.  Recommendations may be updated based on patient status, additional functional criteria and insurance authorization.   Follow Up Recommendations  Follow physician's recommendations for discharge plan and follow up therapies    Assistance Recommended at Discharge Intermittent Supervision/Assistance  Patient can return home with the following A little help with bathing/dressing/bathroom;Assistance with cooking/housework    Functional Status  Assessment  Patient has had a recent decline in their functional status and demonstrates the ability to make significant improvements in function in a reasonable and predictable amount of time.  Equipment Recommendations  None recommended by OT    Recommendations for Other Services       Precautions / Restrictions Precautions Precautions: Shoulder Type of Shoulder Precautions: If sitting in controlled environment, ok to come out of sling to give neck a break. Please sleep in it to protect until follow up in office.     OK to use operative arm for feeding, hygiene and ADLs.   Ok to instruct Pendulums and lap slides as exercises. Ok to use operative arm within the following parameters for ADL purposes     New ROM (8/18)   Ok for PROM, AAROM, AROM within pain tolerance and within the following ROM   ER 20   ABD 45   FE 60 Shoulder Interventions: Off for dressing/bathing/exercises Precaution Booklet Issued:  (handouts) Required Braces or Orthoses: Sling Restrictions Weight Bearing Restrictions: Yes RUE Weight Bearing: Non weight bearing      Mobility Bed Mobility Overal bed mobility: Independent                  Transfers Overall transfer level: Independent                        Balance Overall balance assessment: No apparent balance deficits (not formally assessed)                                         ADL either performed or assessed with clinical judgement   ADL Overall ADL's : Needs assistance/impaired Eating/Feeding: Set up   Grooming: Modified independent  Upper Body Bathing: Minimal assistance   Lower Body Bathing: Minimal assistance Lower Body Bathing Details (indicate cue type and reason): to pull clothing up over right hip Upper Body Dressing : Maximal assistance   Lower Body Dressing: Minimal assistance;Sit to/from stand   Toilet Transfer: Supervision/safety   Toileting- Water quality scientist and Hygiene: Minimal  assistance       Functional mobility during ADLs: Supervision/safety       Vision Patient Visual Report: No change from baseline Vision Assessment?: No apparent visual deficits     Perception     Praxis      Pertinent Vitals/Pain Pain Assessment Pain Assessment: No/denies pain     Hand Dominance Left   Extremity/Trunk Assessment Upper Extremity Assessment Upper Extremity Assessment: RUE deficits/detail;LUE deficits/detail RUE Deficits / Details: impaired sensation and motor control secondary to block LUE Deficits / Details: left handed, reports overuse of left hand and pain in wrist and fingers limiting use. LUE Sensation: WNL LUE Coordination: WNL   Lower Extremity Assessment Lower Extremity Assessment: Overall WFL for tasks assessed   Cervical / Trunk Assessment Cervical / Trunk Assessment: Normal   Communication Communication Communication: No difficulties   Cognition Arousal/Alertness: Awake/alert Behavior During Therapy: WFL for tasks assessed/performed Overall Cognitive Status: Within Functional Limits for tasks assessed                                       General Comments       Exercises     Shoulder Instructions      Home Living Family/patient expects to be discharged to:: Private residence Living Arrangements: Spouse/significant other;Children Available Help at Discharge: Family;Available 24 hours/day Type of Home: House                                  Prior Functioning/Environment Prior Level of Function : Independent/Modified Independent                        OT Problem List: Decreased strength;Decreased range of motion;Pain      OT Treatment/Interventions:      OT Goals(Current goals can be found in the care plan section)    OT Frequency:      Co-evaluation              AM-PAC OT "6 Clicks" Daily Activity     Outcome Measure Help from another person eating meals?: A Little Help  from another person taking care of personal grooming?: None Help from another person toileting, which includes using toliet, bedpan, or urinal?: A Little Help from another person bathing (including washing, rinsing, drying)?: A Little Help from another person to put on and taking off regular upper body clothing?: A Lot Help from another person to put on and taking off regular lower body clothing?: A Little 6 Click Score: 18   End of Session Nurse Communication:  (OT education complete)  Activity Tolerance: Patient tolerated treatment well Patient left: in chair;with call bell/phone within reach;with family/visitor present  OT Visit Diagnosis: Pain                Time: 2725-3664 OT Time Calculation (min): 23 min Charges:  OT General Charges $OT Visit: 1 Visit OT Evaluation $OT Eval Low Complexity: 1 Low OT Treatments $Self Care/Home Management : 8-22 mins  Dashay Giesler, OTR/L  [facility]  Office [phone] Pager: [place] 11/16/2021, 10:26 AM

## 2021-11-16 NOTE — Progress Notes (Signed)
Patient was agreeable and pleasant through most of the rest of the shift.

## 2021-11-19 ENCOUNTER — Encounter (HOSPITAL_COMMUNITY): Payer: Self-pay | Admitting: Orthopedic Surgery

## 2021-11-23 DIAGNOSIS — Z96611 Presence of right artificial shoulder joint: Secondary | ICD-10-CM | POA: Diagnosis not present

## 2021-11-23 DIAGNOSIS — Z471 Aftercare following joint replacement surgery: Secondary | ICD-10-CM | POA: Diagnosis not present

## 2021-11-27 DIAGNOSIS — E876 Hypokalemia: Secondary | ICD-10-CM | POA: Diagnosis not present

## 2021-11-27 DIAGNOSIS — R5383 Other fatigue: Secondary | ICD-10-CM | POA: Diagnosis not present

## 2021-11-27 DIAGNOSIS — D649 Anemia, unspecified: Secondary | ICD-10-CM | POA: Diagnosis not present

## 2021-12-18 DIAGNOSIS — M25511 Pain in right shoulder: Secondary | ICD-10-CM | POA: Diagnosis not present

## 2021-12-20 DIAGNOSIS — M25511 Pain in right shoulder: Secondary | ICD-10-CM | POA: Diagnosis not present

## 2021-12-26 DIAGNOSIS — M25511 Pain in right shoulder: Secondary | ICD-10-CM | POA: Diagnosis not present

## 2021-12-28 DIAGNOSIS — M25511 Pain in right shoulder: Secondary | ICD-10-CM | POA: Diagnosis not present

## 2022-01-02 DIAGNOSIS — M25511 Pain in right shoulder: Secondary | ICD-10-CM | POA: Diagnosis not present

## 2022-01-04 DIAGNOSIS — M25511 Pain in right shoulder: Secondary | ICD-10-CM | POA: Diagnosis not present

## 2022-01-08 DIAGNOSIS — M25511 Pain in right shoulder: Secondary | ICD-10-CM | POA: Diagnosis not present

## 2022-01-15 DIAGNOSIS — M25511 Pain in right shoulder: Secondary | ICD-10-CM | POA: Diagnosis not present

## 2022-01-18 DIAGNOSIS — M25511 Pain in right shoulder: Secondary | ICD-10-CM | POA: Diagnosis not present

## 2022-01-21 DIAGNOSIS — M25511 Pain in right shoulder: Secondary | ICD-10-CM | POA: Diagnosis not present

## 2022-01-24 DIAGNOSIS — M25511 Pain in right shoulder: Secondary | ICD-10-CM | POA: Diagnosis not present

## 2022-01-29 DIAGNOSIS — M25511 Pain in right shoulder: Secondary | ICD-10-CM | POA: Diagnosis not present

## 2022-02-01 DIAGNOSIS — M25511 Pain in right shoulder: Secondary | ICD-10-CM | POA: Diagnosis not present

## 2022-02-04 DIAGNOSIS — M25511 Pain in right shoulder: Secondary | ICD-10-CM | POA: Diagnosis not present

## 2022-02-07 DIAGNOSIS — M25511 Pain in right shoulder: Secondary | ICD-10-CM | POA: Diagnosis not present

## 2022-02-13 DIAGNOSIS — Z471 Aftercare following joint replacement surgery: Secondary | ICD-10-CM | POA: Diagnosis not present

## 2022-02-13 DIAGNOSIS — Z96611 Presence of right artificial shoulder joint: Secondary | ICD-10-CM | POA: Diagnosis not present

## 2022-02-22 DIAGNOSIS — M25511 Pain in right shoulder: Secondary | ICD-10-CM | POA: Diagnosis not present

## 2022-02-25 DIAGNOSIS — Z85828 Personal history of other malignant neoplasm of skin: Secondary | ICD-10-CM | POA: Diagnosis not present

## 2022-02-25 DIAGNOSIS — L821 Other seborrheic keratosis: Secondary | ICD-10-CM | POA: Diagnosis not present

## 2022-02-25 DIAGNOSIS — Z85820 Personal history of malignant melanoma of skin: Secondary | ICD-10-CM | POA: Diagnosis not present

## 2022-02-25 DIAGNOSIS — D2261 Melanocytic nevi of right upper limb, including shoulder: Secondary | ICD-10-CM | POA: Diagnosis not present

## 2022-02-25 DIAGNOSIS — L570 Actinic keratosis: Secondary | ICD-10-CM | POA: Diagnosis not present

## 2022-02-25 DIAGNOSIS — L820 Inflamed seborrheic keratosis: Secondary | ICD-10-CM | POA: Diagnosis not present

## 2022-03-04 DIAGNOSIS — M25511 Pain in right shoulder: Secondary | ICD-10-CM | POA: Diagnosis not present

## 2022-03-08 DIAGNOSIS — M25511 Pain in right shoulder: Secondary | ICD-10-CM | POA: Diagnosis not present

## 2022-03-13 DIAGNOSIS — M25511 Pain in right shoulder: Secondary | ICD-10-CM | POA: Diagnosis not present

## 2022-03-19 DIAGNOSIS — M25511 Pain in right shoulder: Secondary | ICD-10-CM | POA: Diagnosis not present

## 2022-03-27 DIAGNOSIS — M25511 Pain in right shoulder: Secondary | ICD-10-CM | POA: Diagnosis not present

## 2022-05-08 DIAGNOSIS — Z961 Presence of intraocular lens: Secondary | ICD-10-CM | POA: Diagnosis not present

## 2022-05-08 DIAGNOSIS — H5213 Myopia, bilateral: Secondary | ICD-10-CM | POA: Diagnosis not present

## 2022-05-08 DIAGNOSIS — H53002 Unspecified amblyopia, left eye: Secondary | ICD-10-CM | POA: Diagnosis not present

## 2022-05-15 DIAGNOSIS — R202 Paresthesia of skin: Secondary | ICD-10-CM | POA: Diagnosis not present

## 2022-05-15 DIAGNOSIS — B029 Zoster without complications: Secondary | ICD-10-CM | POA: Diagnosis not present

## 2022-05-20 DIAGNOSIS — Z96611 Presence of right artificial shoulder joint: Secondary | ICD-10-CM | POA: Diagnosis not present

## 2022-06-03 DIAGNOSIS — L309 Dermatitis, unspecified: Secondary | ICD-10-CM | POA: Diagnosis not present

## 2022-06-03 DIAGNOSIS — Z85828 Personal history of other malignant neoplasm of skin: Secondary | ICD-10-CM | POA: Diagnosis not present

## 2022-06-03 DIAGNOSIS — L821 Other seborrheic keratosis: Secondary | ICD-10-CM | POA: Diagnosis not present

## 2022-06-03 DIAGNOSIS — D1801 Hemangioma of skin and subcutaneous tissue: Secondary | ICD-10-CM | POA: Diagnosis not present

## 2022-06-03 DIAGNOSIS — L820 Inflamed seborrheic keratosis: Secondary | ICD-10-CM | POA: Diagnosis not present

## 2022-07-10 ENCOUNTER — Telehealth: Payer: Self-pay

## 2022-07-10 NOTE — Telephone Encounter (Signed)
Returned pt's call. LVM to call us back at her convenience.

## 2022-07-17 DIAGNOSIS — K219 Gastro-esophageal reflux disease without esophagitis: Secondary | ICD-10-CM | POA: Diagnosis not present

## 2022-07-17 DIAGNOSIS — M8580 Other specified disorders of bone density and structure, unspecified site: Secondary | ICD-10-CM | POA: Diagnosis not present

## 2022-07-17 DIAGNOSIS — Z1212 Encounter for screening for malignant neoplasm of rectum: Secondary | ICD-10-CM | POA: Diagnosis not present

## 2022-07-17 DIAGNOSIS — E785 Hyperlipidemia, unspecified: Secondary | ICD-10-CM | POA: Diagnosis not present

## 2022-07-17 DIAGNOSIS — D649 Anemia, unspecified: Secondary | ICD-10-CM | POA: Diagnosis not present

## 2022-07-17 DIAGNOSIS — R7989 Other specified abnormal findings of blood chemistry: Secondary | ICD-10-CM | POA: Diagnosis not present

## 2022-07-17 DIAGNOSIS — I10 Essential (primary) hypertension: Secondary | ICD-10-CM | POA: Diagnosis not present

## 2022-07-24 DIAGNOSIS — M25511 Pain in right shoulder: Secondary | ICD-10-CM | POA: Diagnosis not present

## 2022-07-24 DIAGNOSIS — I10 Essential (primary) hypertension: Secondary | ICD-10-CM | POA: Diagnosis not present

## 2022-07-24 DIAGNOSIS — Z1339 Encounter for screening examination for other mental health and behavioral disorders: Secondary | ICD-10-CM | POA: Diagnosis not present

## 2022-07-24 DIAGNOSIS — N289 Disorder of kidney and ureter, unspecified: Secondary | ICD-10-CM | POA: Diagnosis not present

## 2022-07-24 DIAGNOSIS — G2581 Restless legs syndrome: Secondary | ICD-10-CM | POA: Diagnosis not present

## 2022-07-24 DIAGNOSIS — Z1331 Encounter for screening for depression: Secondary | ICD-10-CM | POA: Diagnosis not present

## 2022-07-24 DIAGNOSIS — M1990 Unspecified osteoarthritis, unspecified site: Secondary | ICD-10-CM | POA: Diagnosis not present

## 2022-07-24 DIAGNOSIS — D649 Anemia, unspecified: Secondary | ICD-10-CM | POA: Diagnosis not present

## 2022-07-24 DIAGNOSIS — Z0000 Encounter for general adult medical examination without abnormal findings: Secondary | ICD-10-CM | POA: Diagnosis not present

## 2022-07-24 DIAGNOSIS — Z23 Encounter for immunization: Secondary | ICD-10-CM | POA: Diagnosis not present

## 2022-07-24 DIAGNOSIS — E785 Hyperlipidemia, unspecified: Secondary | ICD-10-CM | POA: Diagnosis not present

## 2022-07-24 DIAGNOSIS — R202 Paresthesia of skin: Secondary | ICD-10-CM | POA: Diagnosis not present

## 2022-07-25 ENCOUNTER — Other Ambulatory Visit: Payer: Self-pay | Admitting: Internal Medicine

## 2022-07-25 DIAGNOSIS — Z0000 Encounter for general adult medical examination without abnormal findings: Secondary | ICD-10-CM

## 2022-08-12 ENCOUNTER — Ambulatory Visit (INDEPENDENT_AMBULATORY_CARE_PROVIDER_SITE_OTHER): Payer: Self-pay

## 2022-08-12 DIAGNOSIS — I8393 Asymptomatic varicose veins of bilateral lower extremities: Secondary | ICD-10-CM

## 2022-08-12 NOTE — Progress Notes (Signed)
Treated pt's BLE spider and small reticular veins with Asclera 1% administered with a 27g butterfly.  Patient received a total of 4 mL/40 mg. Pt tolerated very well. She has a lot of scattered treatable veins but only opted to do 2 syringes today. We discussed not being able to inject much of her foot area, which is a cosmetic concern for her. She was given post treatment care instructions verbally and on handout. She will call if she has any questions/concerns.   Photos: Yes.    Compression stockings applied: Yes.

## 2022-08-19 DIAGNOSIS — N289 Disorder of kidney and ureter, unspecified: Secondary | ICD-10-CM | POA: Diagnosis not present

## 2022-08-19 DIAGNOSIS — M25511 Pain in right shoulder: Secondary | ICD-10-CM | POA: Diagnosis not present

## 2022-08-19 DIAGNOSIS — R112 Nausea with vomiting, unspecified: Secondary | ICD-10-CM | POA: Diagnosis not present

## 2022-08-19 DIAGNOSIS — M1990 Unspecified osteoarthritis, unspecified site: Secondary | ICD-10-CM | POA: Diagnosis not present

## 2022-09-02 DIAGNOSIS — I10 Essential (primary) hypertension: Secondary | ICD-10-CM | POA: Diagnosis not present

## 2022-09-02 DIAGNOSIS — M542 Cervicalgia: Secondary | ICD-10-CM | POA: Diagnosis not present

## 2022-09-02 DIAGNOSIS — N289 Disorder of kidney and ureter, unspecified: Secondary | ICD-10-CM | POA: Diagnosis not present

## 2022-09-02 DIAGNOSIS — D649 Anemia, unspecified: Secondary | ICD-10-CM | POA: Diagnosis not present

## 2022-09-03 ENCOUNTER — Ambulatory Visit: Payer: Medicare PPO | Admitting: Podiatry

## 2022-09-03 DIAGNOSIS — L84 Corns and callosities: Secondary | ICD-10-CM

## 2022-09-03 DIAGNOSIS — B351 Tinea unguium: Secondary | ICD-10-CM

## 2022-09-03 DIAGNOSIS — M216X9 Other acquired deformities of unspecified foot: Secondary | ICD-10-CM | POA: Diagnosis not present

## 2022-09-03 DIAGNOSIS — M7751 Other enthesopathy of right foot: Secondary | ICD-10-CM

## 2022-09-03 NOTE — Progress Notes (Signed)
Subjective: Chief Complaint  Patient presents with   Callouses    Patient reports bilateral callus pain. She is going to [place] soon and would like to be able to walk comfortably.    77 year old female presents for above concerns.  She states that she had been quite bit of pain on the calluses that she is having happened to him that she is going to [place] next week and to be on her feet a lot.  She is also making some pain on the avid bunion on the right foot.  No injuries.  Gets occasional intermittent sharp pain.  She tried offloading pads.  She also previously was on Lamisil for nail fungus since we discontinued it.  She recently had blood work performed by the primary care doctor. She is asking to check the toenail.   Objective: AAO x3, NAD DP/PT pulses palpable bilaterally, CRT less than 3 seconds Thick annular hyperkeratotic lesions present but submetatarsal bilaterally without any underlying ulceration drainage or any signs of infection.  There are no open lesions.  There is significant prominence of metatarsal heads from the back to the back of the head.  Bunions present the right foot with tenderness palpation directly on the medial eminence.  There is no area pinpoint tenderness.  No edema. Bilateral hallux nails still have some dystrophy with yellow discoloration.  Growing out.  No edema, erythema or any signs of infection of the toenail sites. No pain with calf compression, swelling, warmth, erythema  Assessment: 77 year old female with prominent metatarsal heads results and hyperkeratotic lesions; bunion/capsulitis right foot; onychomycosis  Plan: -All treatment options discussed with the patient including all alternatives, risks, complications.  -As a courtesy I debrided the calluses bilaterally without any complications or bleeding.  Unfortunate given the significant problems metatarsal heads with atrophy of fat pad that is causing pressure, calluses.  I dispensed a gel  offloading pad oxygenator felt offloading pads for her upcoming trip. -Patient was proceed with a steroid injection of the right foot on the area of the bunion.  Skin skin with alcohol and a mixture of 1 cc dozens of phosphate, 1 cc Marcaine was infiltrated into and around the first lesion without complications.  Postinjection care discussed.  Tolerated well. -Discussed some treatment options for nail fungus.  I do think going back on the oral medication would be helpful.  Will try to get the blood work from her PCP.  *x-ray right foot if bunion still painful next appt  Vivi Barrack DPM

## 2022-10-01 ENCOUNTER — Ambulatory Visit
Admission: RE | Admit: 2022-10-01 | Discharge: 2022-10-01 | Disposition: A | Discharge status: Home / Self Care | Payer: Medicare PPO | Source: Ambulatory Visit | Attending: Internal Medicine | Admitting: Internal Medicine

## 2022-10-01 DIAGNOSIS — Z0000 Encounter for general adult medical examination without abnormal findings: Secondary | ICD-10-CM

## 2022-10-01 DIAGNOSIS — Z1231 Encounter for screening mammogram for malignant neoplasm of breast: Secondary | ICD-10-CM | POA: Diagnosis not present

## 2022-10-18 ENCOUNTER — Ambulatory Visit: Payer: Medicare PPO | Admitting: Podiatry

## 2022-10-18 DIAGNOSIS — M216X9 Other acquired deformities of unspecified foot: Secondary | ICD-10-CM | POA: Diagnosis not present

## 2022-10-18 DIAGNOSIS — L84 Corns and callosities: Secondary | ICD-10-CM

## 2022-10-18 DIAGNOSIS — M7751 Other enthesopathy of right foot: Secondary | ICD-10-CM | POA: Diagnosis not present

## 2022-10-18 NOTE — Patient Instructions (Signed)

## 2022-10-29 ENCOUNTER — Ambulatory Visit: Payer: Medicare PPO | Admitting: Vascular Surgery

## 2022-10-29 ENCOUNTER — Encounter: Payer: Self-pay | Admitting: Vascular Surgery

## 2022-10-29 VITALS — BP 169/76 | HR 71 | Temp 98.2°F | Resp 20 | Ht 62.0 in | Wt 157.0 lb

## 2022-10-29 DIAGNOSIS — I872 Venous insufficiency (chronic) (peripheral): Secondary | ICD-10-CM

## 2022-10-29 DIAGNOSIS — I8001 Phlebitis and thrombophlebitis of superficial vessels of right lower extremity: Secondary | ICD-10-CM

## 2022-10-29 NOTE — Progress Notes (Signed)
Subjective: Chief Complaint  Patient presents with   Capsulitis of toe, right    Pt stated that she needs another injection in her toe she would also like her callouses to be shaved down     77 year old female presents for above concerns.  She was sent to [place] on vacation still while walking.  The calluses each of this are causing quite a bit of pain she is requesting approximately steroid injection.  No recent injuries or changes otherwise.   Objective: AAO x3, NAD DP/PT pulses palpable bilaterally, CRT less than 3 seconds Thick annular hyperkeratotic lesions present but submetatarsal bilaterally without any underlying ulceration drainage or any signs of infection.  There are no open lesions.  There is significant prominence of metatarsal heads from the back to the back of the head.  Bunions present the right foot with tenderness palpation directly on the medial eminence.  There is no area pinpoint tenderness.  No edema. No pain with calf compression, swelling, warmth, erythema  Assessment: 77 year old female with prominent metatarsal heads results and hyperkeratotic lesions; bunion/capsulitis right foot; onychomycosis  Plan: -All treatment options discussed with the patient including all alternatives, risks, complications.  -As a courtesy I debrided the calluses bilaterally without any complications or bleeding.  Unfortunate given the significant problems metatarsal heads with atrophy of fat pad that is causing pressure, calluses.  Continue with metatarsal offloading support. -Patient was proceed with a steroid injection of the right foot on the area of the bunion.,  Just proximal to the first metatarsal head today as well.  Skin skin with alcohol and a mixture of 1 cc dozens of phosphate, 1 cc Marcaine was infiltrated into and around the first lesion without complications.  Postinjection care discussed.  Tolerated well.  Vivi Barrack DPM

## 2022-10-29 NOTE — Progress Notes (Signed)
[facility] [place] PROGRESS NOTE  ASSESSMENT / PLAN: Natalie [name] is a 77 y.o. female with superficial thrombophlebitis of right anterior calf. Recommend Motrin 400mg  PO TID x 1 week. Warm compress to anterior calf. Follow up with me on as needed basis.  SUBJECTIVE: Patient is a healthy elderly woman who presents to care for evaluation of a painful knot in her right calf.  She has a long history of chronic venous insufficiency.  She has undergone multiple sclerotherapy treatments for spider veins of both lower extremities which are bothersome to her.  Over the past week or so she has had a painful knot in the right anterior calf.  She reports prior to this she was spending all other time walking in [place] celebrating her 60th wedding anniversary.  OBJECTIVE: BP (!) 169/76 (BP Location: Left Arm, Patient Position: Sitting, Cuff Size: Normal)   Pulse 71   Temp 98.2 F (36.8 C)   Resp 20   Ht 5\' 2"  (1.575 m)   Wt 157 lb (71.2 kg)   SpO2 98%   BMI 28.72 kg/m   Elderly woman in no acute distress Regular rate and rhythm Unlabored breathing Focal area of thrombophlebitis over the right anterior calf.  This is demonstrated on limited bedside duplex as well.  Multiple areas of reticular veins about the feet and ankles bilaterally.     Latest Ref Rng & Units 11/07/2021   10:49 AM 08/27/2021   11:18 AM 08/02/2021    4:07 AM  CBC  WBC 4.0 - 10.5 K/uL 5.8  6.0  8.3   Hemoglobin 12.0 - 15.0 g/dL 16.1  9.5  9.1   Hematocrit 36.0 - 46.0 % 33.1  29.8  28.8   Platelets 150 - 400 K/uL 238  174  335         Latest Ref Rng & Units 11/07/2021   10:49 AM 08/27/2021   11:18 AM 08/01/2021    4:43 AM  CMP  Glucose 70 - 99 mg/dL 97  82  096   BUN 8 - 23 mg/dL 27  21  19    Creatinine 0.44 - 1.00 mg/dL 0.45  4.09  8.11   Sodium 135 - 145 mmol/L 137  137  137   Potassium 3.5 - 5.1 mmol/L 3.7  4.0  3.7   Chloride 98 - 111 mmol/L 101  103  104   CO2 22 - 32 mmol/L 26  27   24    Calcium 8.9 - 10.3 mg/dL 9.4  8.9  8.9   Total Protein 6.5 - 8.1 g/dL   6.5   Total Bilirubin 0.3 - 1.2 mg/dL   0.3   Alkaline Phos 38 - 126 U/L   59   AST 15 - 41 U/L   10   ALT 0 - 44 U/L   14    Total time spent on preparing this encounter including chart review, data review, collecting history, examining the patient, coordinating care for this established patient, 20 minutes.  Rande Brunt. Lenell Antu, MD [facility] Phone Number: [phone] 10/29/2022 9:33 AM

## 2022-10-30 DIAGNOSIS — N289 Disorder of kidney and ureter, unspecified: Secondary | ICD-10-CM | POA: Diagnosis not present

## 2022-10-30 DIAGNOSIS — Z96611 Presence of right artificial shoulder joint: Secondary | ICD-10-CM | POA: Diagnosis not present

## 2022-10-30 DIAGNOSIS — J309 Allergic rhinitis, unspecified: Secondary | ICD-10-CM | POA: Diagnosis not present

## 2022-10-30 DIAGNOSIS — I10 Essential (primary) hypertension: Secondary | ICD-10-CM | POA: Diagnosis not present

## 2022-10-30 DIAGNOSIS — R0981 Nasal congestion: Secondary | ICD-10-CM | POA: Diagnosis not present

## 2022-10-30 DIAGNOSIS — R051 Acute cough: Secondary | ICD-10-CM | POA: Diagnosis not present

## 2022-10-30 DIAGNOSIS — J069 Acute upper respiratory infection, unspecified: Secondary | ICD-10-CM | POA: Diagnosis not present

## 2022-11-05 ENCOUNTER — Other Ambulatory Visit (HOSPITAL_COMMUNITY): Payer: Self-pay | Admitting: Vascular Surgery

## 2022-11-05 ENCOUNTER — Ambulatory Visit (HOSPITAL_COMMUNITY)
Admission: RE | Admit: 2022-11-05 | Discharge: 2022-11-05 | Disposition: A | Discharge status: Home / Self Care | Payer: Medicare PPO | Source: Ambulatory Visit | Attending: Vascular Surgery | Admitting: Vascular Surgery

## 2022-11-05 ENCOUNTER — Ambulatory Visit: Payer: Medicare PPO | Admitting: Vascular Surgery

## 2022-11-05 ENCOUNTER — Telehealth: Payer: Self-pay | Admitting: *Deleted

## 2022-11-05 ENCOUNTER — Encounter: Payer: Self-pay | Admitting: Vascular Surgery

## 2022-11-05 VITALS — BP 153/82 | HR 81 | Temp 98.2°F | Resp 20 | Ht 62.0 in | Wt 157.0 lb

## 2022-11-05 DIAGNOSIS — I809 Phlebitis and thrombophlebitis of unspecified site: Secondary | ICD-10-CM

## 2022-11-05 DIAGNOSIS — I80221 Phlebitis and thrombophlebitis of right popliteal vein: Secondary | ICD-10-CM | POA: Diagnosis not present

## 2022-11-05 DIAGNOSIS — M79604 Pain in right leg: Secondary | ICD-10-CM | POA: Diagnosis not present

## 2022-11-05 NOTE — Progress Notes (Signed)
[facility] OF [place] PROGRESS NOTE  ASSESSMENT / PLAN: Natalie [name] is a 77 y.o. female with right leg pain. Resolved superficial thrombophlebitis. I suspect she has sciatica. I encouraged her to follow up with her primary care physician. Venous duplex today normal - no evidence of DVT. Follow up with me on as needed basis.  SUBJECTIVE: Patient returns to clinic because she continues to have pain. She reports radiating pain from her hip to her calf. She reports discomfort sitting for prolonged periods of time. The "knot" on her pretibial right leg has resolved. She feels her reticular veins are causing her radiating discomfort. I reassured her this was not the case.   OBJECTIVE: BP (!) 153/82 (BP Location: Left Arm, Patient Position: Sitting, Cuff Size: Normal)   Pulse 81   Temp 98.2 F (36.8 C)   Resp 20   Ht 5\' 2"  (1.575 m)   Wt 157 lb (71.2 kg)   SpO2 99%   BMI 28.72 kg/m   Elderly woman in no acute distress Regular rate and rhythm Unlabored breathing Resolved thrombophlebitis in R leg.     Latest Ref Rng & Units 11/07/2021   10:49 AM 08/27/2021   11:18 AM 08/02/2021    4:07 AM  CBC  WBC 4.0 - 10.5 K/uL 5.8  6.0  8.3   Hemoglobin 12.0 - 15.0 g/dL 29.5  9.5  9.1   Hematocrit 36.0 - 46.0 % 33.1  29.8  28.8   Platelets 150 - 400 K/uL 238  174  335         Latest Ref Rng & Units 11/07/2021   10:49 AM 08/27/2021   11:18 AM 08/01/2021    4:43 AM  CMP  Glucose 70 - 99 mg/dL 97  82  284   BUN 8 - 23 mg/dL 27  21  19    Creatinine 0.44 - 1.00 mg/dL 1.32  4.40  1.02   Sodium 135 - 145 mmol/L 137  137  137   Potassium 3.5 - 5.1 mmol/L 3.7  4.0  3.7   Chloride 98 - 111 mmol/L 101  103  104   CO2 22 - 32 mmol/L 26  27  24    Calcium 8.9 - 10.3 mg/dL 9.4  8.9  8.9   Total Protein 6.5 - 8.1 g/dL   6.5   Total Bilirubin 0.3 - 1.2 mg/dL   0.3   Alkaline Phos 38 - 126 U/L   59   AST 15 - 41 U/L   10   ALT 0 - 44 U/L   14    Right lower extremity venous  duplex:   RIGHT:  - There is no evidence of deep vein thrombosis in the lower extremity.    - No cystic structure found in the popliteal fossa.    LEFT:  - No evidence of common femoral vein obstruction.   Total time spent on preparing this encounter including chart review, data review, collecting history, examining the patient, coordinating care for this established patient, 20 minutes.  Rande Brunt. Lenell Antu, MD [facility] of [facility] Phone Number: [phone] 11/05/2022 9:03 PM

## 2022-11-05 NOTE — Telephone Encounter (Signed)
Returning telephone voice message from Natalie [name] regarding severe right leg pain.  Natalie [name] was seen by Dr. Juanetta Gosling on 12-28-2022  for right leg pain (evaluation of painful knot in right calf).  Diagnosed with superficial thrombophlebitis right anterior calf and recommended Ibuprofen 400 MG TID for 1 week and warm compresses.  Natalie [name] states she has not been taking Ibuprofen as recommended but is taking Meloxicam 1/2 tablet daily.  Natalie [name] states right leg pain has worsened over the past week and is "moving up my right leg to my hip" and that it is painful to walk.  Discussed Natalie [name]'s current symptoms with Dr. Lenell Antu and he recommended venous duplex(right leg, order in EPIC) and VV FU with him.  Appointments made and patient notified by phone venous duplex right leg 01-06-2023 at 2:30 PM and VV FU with Dr. Lenell Antu at 3:40 PM.  Instructed Natalie [name] to expect a wait time between ultrasound and follow up with Dr. Lenell Antu.  Natalie [name] verbalized understanding.

## 2022-11-20 DIAGNOSIS — H6502 Acute serous otitis media, left ear: Secondary | ICD-10-CM | POA: Diagnosis not present

## 2022-11-20 DIAGNOSIS — H9012 Conductive hearing loss, unilateral, left ear, with unrestricted hearing on the contralateral side: Secondary | ICD-10-CM | POA: Diagnosis not present

## 2022-12-18 DIAGNOSIS — J029 Acute pharyngitis, unspecified: Secondary | ICD-10-CM | POA: Diagnosis not present

## 2022-12-18 DIAGNOSIS — I10 Essential (primary) hypertension: Secondary | ICD-10-CM | POA: Diagnosis not present

## 2022-12-18 DIAGNOSIS — N289 Disorder of kidney and ureter, unspecified: Secondary | ICD-10-CM | POA: Diagnosis not present

## 2022-12-18 DIAGNOSIS — J309 Allergic rhinitis, unspecified: Secondary | ICD-10-CM | POA: Diagnosis not present

## 2022-12-18 DIAGNOSIS — U071 COVID-19: Secondary | ICD-10-CM | POA: Diagnosis not present

## 2022-12-18 DIAGNOSIS — R051 Acute cough: Secondary | ICD-10-CM | POA: Diagnosis not present

## 2022-12-18 DIAGNOSIS — R0981 Nasal congestion: Secondary | ICD-10-CM | POA: Diagnosis not present

## 2022-12-18 DIAGNOSIS — Z1152 Encounter for screening for COVID-19: Secondary | ICD-10-CM | POA: Diagnosis not present

## 2022-12-18 DIAGNOSIS — R638 Other symptoms and signs concerning food and fluid intake: Secondary | ICD-10-CM | POA: Diagnosis not present

## 2023-01-09 DIAGNOSIS — Z23 Encounter for immunization: Secondary | ICD-10-CM | POA: Diagnosis not present

## 2023-01-20 DIAGNOSIS — H90A31 Mixed conductive and sensorineural hearing loss, unilateral, right ear with restricted hearing on the contralateral side: Secondary | ICD-10-CM | POA: Diagnosis not present

## 2023-01-28 DIAGNOSIS — M1990 Unspecified osteoarthritis, unspecified site: Secondary | ICD-10-CM | POA: Diagnosis not present

## 2023-01-28 DIAGNOSIS — E785 Hyperlipidemia, unspecified: Secondary | ICD-10-CM | POA: Diagnosis not present

## 2023-01-28 DIAGNOSIS — M25511 Pain in right shoulder: Secondary | ICD-10-CM | POA: Diagnosis not present

## 2023-01-28 DIAGNOSIS — R202 Paresthesia of skin: Secondary | ICD-10-CM | POA: Diagnosis not present

## 2023-01-28 DIAGNOSIS — N289 Disorder of kidney and ureter, unspecified: Secondary | ICD-10-CM | POA: Diagnosis not present

## 2023-01-28 DIAGNOSIS — D649 Anemia, unspecified: Secondary | ICD-10-CM | POA: Diagnosis not present

## 2023-01-28 DIAGNOSIS — I10 Essential (primary) hypertension: Secondary | ICD-10-CM | POA: Diagnosis not present

## 2023-01-28 DIAGNOSIS — G2581 Restless legs syndrome: Secondary | ICD-10-CM | POA: Diagnosis not present

## 2023-01-28 DIAGNOSIS — I872 Venous insufficiency (chronic) (peripheral): Secondary | ICD-10-CM | POA: Diagnosis not present

## 2023-02-27 DIAGNOSIS — Z85828 Personal history of other malignant neoplasm of skin: Secondary | ICD-10-CM | POA: Diagnosis not present

## 2023-02-27 DIAGNOSIS — L821 Other seborrheic keratosis: Secondary | ICD-10-CM | POA: Diagnosis not present

## 2023-02-27 DIAGNOSIS — D692 Other nonthrombocytopenic purpura: Secondary | ICD-10-CM | POA: Diagnosis not present

## 2023-02-27 DIAGNOSIS — L820 Inflamed seborrheic keratosis: Secondary | ICD-10-CM | POA: Diagnosis not present

## 2023-02-27 DIAGNOSIS — D2272 Melanocytic nevi of left lower limb, including hip: Secondary | ICD-10-CM | POA: Diagnosis not present

## 2023-02-27 DIAGNOSIS — D2371 Other benign neoplasm of skin of right lower limb, including hip: Secondary | ICD-10-CM | POA: Diagnosis not present

## 2023-02-27 DIAGNOSIS — L570 Actinic keratosis: Secondary | ICD-10-CM | POA: Diagnosis not present

## 2023-02-27 DIAGNOSIS — L309 Dermatitis, unspecified: Secondary | ICD-10-CM | POA: Diagnosis not present

## 2023-03-25 ENCOUNTER — Ambulatory Visit (INDEPENDENT_AMBULATORY_CARE_PROVIDER_SITE_OTHER): Payer: Medicare PPO | Admitting: Nurse Practitioner

## 2023-03-25 ENCOUNTER — Encounter: Payer: Self-pay | Admitting: Nurse Practitioner

## 2023-03-25 VITALS — BP 124/64 | HR 64 | Ht 60.24 in | Wt 152.0 lb

## 2023-03-25 DIAGNOSIS — Z780 Asymptomatic menopausal state: Secondary | ICD-10-CM | POA: Diagnosis not present

## 2023-03-25 DIAGNOSIS — Z01419 Encounter for gynecological examination (general) (routine) without abnormal findings: Secondary | ICD-10-CM | POA: Diagnosis not present

## 2023-03-25 DIAGNOSIS — L309 Dermatitis, unspecified: Secondary | ICD-10-CM | POA: Diagnosis not present

## 2023-03-25 DIAGNOSIS — M8589 Other specified disorders of bone density and structure, multiple sites: Secondary | ICD-10-CM

## 2023-03-25 NOTE — Progress Notes (Signed)
Natalie [name] [birth year] [id number]   History:  77 y.o. [id number] presents for breast and pelvic exam. Complains of itching on buttocks. Has had a spot for years, seeing derm for it, using nystatin + triamcinolone. Postmenopausal - stopped HRT a couple of years ago and doing fine. S/P 1982 TAH BSO for DUB. Normal pap and mammogram history. HTN, HLD managed by PCP. History of skin cancer.   Gynecologic History No LMP recorded. Patient has had a hysterectomy.   Sexually active: No, due to husband's health  Health maintenance Last Pap: 2010. Results were: Normal Last mammogram: 10/01/2022. Results were: Normal Last colonoscopy: 05/06/2018. Results were: Benign polyps Last Dexa: 04/18/2020. Results were: T-score -2.0, FRAX 14% / 3.6%  Past medical history, past surgical history, family history and social history were all reviewed and documented in the EPIC chart. Married. 58 yo son, not married. Daughter is retired Runner, broadcasting/film/video, has son and daughter.  ROS:  A ROS was performed and pertinent positives and negatives are included.  Exam:  Vitals:   03/25/23 1116  BP: 124/64  Pulse: 64  SpO2: 100%  Weight: 152 lb (68.9 kg)  Height: 5' 0.24" (1.53 m)     Body mass index is 29.45 kg/m.  General appearance:  Normal Thyroid:  Symmetrical, normal in size, without palpable masses or nodularity. Respiratory  Auscultation:  Clear without wheezing or rhonchi Cardiovascular  Auscultation:  Regular rate, without rubs, murmurs or gallops  Edema/varicosities:  Not grossly evident Abdominal  Soft,nontender, without masses, guarding or rebound.  Liver/spleen:  No organomegaly noted  Hernia:  None appreciated  Skin  Inspection:  Grossly normal. Many seborrheic keratoses. ~1 cm dry, scaly area on upper left gluteal cleft, healing Breasts: Examined lying and sitting.   Right: Without masses, retractions, discharge or axillary adenopathy. Small dry patch on right superior nipple, erythematous  base  Left: Without masses, retractions, discharge or axillary adenopathy. Genitourinary   Inguinal/mons:  Normal without inguinal adenopathy  External genitalia:  Normal appearing vulva with no masses, tenderness, or lesions.  BUS/Urethra/Skene's glands:  Normal  Vagina:  Normal appearing with normal color and discharge, no lesions. Atrophic changes. Atrophic changes  Cervix:  Absent  Uterus:  Absent  Adnexa/parametria:     Rt: Normal in size, without masses or tenderness.   Lt: Normal in size, without masses or tenderness.  Anus and perineum: Normal  Digital rectal exam: Deferred  Patient informed chaperone available to be present for breast and pelvic exam. Patient has requested no chaperone to be present. Patient has been advised what will be completed during breast and pelvic exam.   Assessment/Plan:  77 y.o. [id number] for breast and pelvic exam.   Encounter for breast and pelvic examination - Education provided on SBEs, importance of preventative screenings, current guidelines, high calcium diet, regular exercise, and multivitamin daily.  Labs with PCP.   Postmenopausal - stopped HRT a couple of years ago and is doing fine. S/P 1982 TAH BSO for DUB.  Osteopenia of multiple sites - Plan: DG Bone Density. T-score -2.0 without elevated FRAX. Continue Vitamin D supplement. Increase calcium in diet or take supplement, increase exercise. Will schedule DXA at [facility]  Eczema, unspecified type - continue topical Nystatin + Triamcinolone. Appears to be improving. Follow up with derm.   Screening for cervical cancer - Normal Pap history.  No longer screening per guidelines.  Screening for breast cancer - Normal mammogram history. Continue annual screenings. Normal breast exam today.  Screening for colon cancer -  2020 colonoscopy. Screenings no longer recommended per GI.   Return in about 2 years (around 03/24/2025) for B&P.      Olivia Mackie [facility], 11:54 AM 03/25/2023

## 2023-03-26 ENCOUNTER — Other Ambulatory Visit: Payer: Self-pay | Admitting: Nurse Practitioner

## 2023-03-26 DIAGNOSIS — Z1231 Encounter for screening mammogram for malignant neoplasm of breast: Secondary | ICD-10-CM

## 2023-04-08 DIAGNOSIS — R0981 Nasal congestion: Secondary | ICD-10-CM | POA: Diagnosis not present

## 2023-04-08 DIAGNOSIS — J020 Streptococcal pharyngitis: Secondary | ICD-10-CM | POA: Diagnosis not present

## 2023-04-08 DIAGNOSIS — R5383 Other fatigue: Secondary | ICD-10-CM | POA: Diagnosis not present

## 2023-04-08 DIAGNOSIS — Z1152 Encounter for screening for COVID-19: Secondary | ICD-10-CM | POA: Diagnosis not present

## 2023-04-08 DIAGNOSIS — R059 Cough, unspecified: Secondary | ICD-10-CM | POA: Diagnosis not present

## 2023-04-08 DIAGNOSIS — J029 Acute pharyngitis, unspecified: Secondary | ICD-10-CM | POA: Diagnosis not present

## 2023-05-21 DIAGNOSIS — H5213 Myopia, bilateral: Secondary | ICD-10-CM | POA: Diagnosis not present

## 2023-05-21 DIAGNOSIS — Z961 Presence of intraocular lens: Secondary | ICD-10-CM | POA: Diagnosis not present

## 2023-07-25 DIAGNOSIS — M1812 Unilateral primary osteoarthritis of first carpometacarpal joint, left hand: Secondary | ICD-10-CM | POA: Diagnosis not present

## 2023-07-29 DIAGNOSIS — K219 Gastro-esophageal reflux disease without esophagitis: Secondary | ICD-10-CM | POA: Diagnosis not present

## 2023-07-29 DIAGNOSIS — E7849 Other hyperlipidemia: Secondary | ICD-10-CM | POA: Diagnosis not present

## 2023-07-29 DIAGNOSIS — D649 Anemia, unspecified: Secondary | ICD-10-CM | POA: Diagnosis not present

## 2023-07-29 DIAGNOSIS — M8580 Other specified disorders of bone density and structure, unspecified site: Secondary | ICD-10-CM | POA: Diagnosis not present

## 2023-07-29 DIAGNOSIS — I10 Essential (primary) hypertension: Secondary | ICD-10-CM | POA: Diagnosis not present

## 2023-07-29 DIAGNOSIS — Z1212 Encounter for screening for malignant neoplasm of rectum: Secondary | ICD-10-CM | POA: Diagnosis not present

## 2023-07-29 DIAGNOSIS — E785 Hyperlipidemia, unspecified: Secondary | ICD-10-CM | POA: Diagnosis not present

## 2023-08-01 DIAGNOSIS — Z1212 Encounter for screening for malignant neoplasm of rectum: Secondary | ICD-10-CM | POA: Diagnosis not present

## 2023-08-01 DIAGNOSIS — D649 Anemia, unspecified: Secondary | ICD-10-CM | POA: Diagnosis not present

## 2023-08-01 DIAGNOSIS — E785 Hyperlipidemia, unspecified: Secondary | ICD-10-CM | POA: Diagnosis not present

## 2023-08-05 DIAGNOSIS — R82998 Other abnormal findings in urine: Secondary | ICD-10-CM | POA: Diagnosis not present

## 2023-08-05 DIAGNOSIS — R202 Paresthesia of skin: Secondary | ICD-10-CM | POA: Diagnosis not present

## 2023-08-05 DIAGNOSIS — K219 Gastro-esophageal reflux disease without esophagitis: Secondary | ICD-10-CM | POA: Diagnosis not present

## 2023-08-05 DIAGNOSIS — E785 Hyperlipidemia, unspecified: Secondary | ICD-10-CM | POA: Diagnosis not present

## 2023-08-05 DIAGNOSIS — Z1331 Encounter for screening for depression: Secondary | ICD-10-CM | POA: Diagnosis not present

## 2023-08-05 DIAGNOSIS — Z1339 Encounter for screening examination for other mental health and behavioral disorders: Secondary | ICD-10-CM | POA: Diagnosis not present

## 2023-08-05 DIAGNOSIS — Z0000 Encounter for general adult medical examination without abnormal findings: Secondary | ICD-10-CM | POA: Diagnosis not present

## 2023-08-05 DIAGNOSIS — M25511 Pain in right shoulder: Secondary | ICD-10-CM | POA: Diagnosis not present

## 2023-08-05 DIAGNOSIS — D649 Anemia, unspecified: Secondary | ICD-10-CM | POA: Diagnosis not present

## 2023-08-05 DIAGNOSIS — N289 Disorder of kidney and ureter, unspecified: Secondary | ICD-10-CM | POA: Diagnosis not present

## 2023-08-05 DIAGNOSIS — I10 Essential (primary) hypertension: Secondary | ICD-10-CM | POA: Diagnosis not present

## 2023-08-05 DIAGNOSIS — J309 Allergic rhinitis, unspecified: Secondary | ICD-10-CM | POA: Diagnosis not present

## 2023-08-19 DIAGNOSIS — D649 Anemia, unspecified: Secondary | ICD-10-CM | POA: Diagnosis not present

## 2023-11-04 DIAGNOSIS — I10 Essential (primary) hypertension: Secondary | ICD-10-CM | POA: Diagnosis not present

## 2023-11-04 DIAGNOSIS — M545 Low back pain, unspecified: Secondary | ICD-10-CM | POA: Diagnosis not present

## 2023-11-05 DIAGNOSIS — M12812 Other specific arthropathies, not elsewhere classified, left shoulder: Secondary | ICD-10-CM | POA: Diagnosis not present

## 2023-11-05 DIAGNOSIS — Z96619 Presence of unspecified artificial shoulder joint: Secondary | ICD-10-CM | POA: Diagnosis not present

## 2023-11-05 DIAGNOSIS — M25511 Pain in right shoulder: Secondary | ICD-10-CM | POA: Diagnosis not present

## 2023-11-05 DIAGNOSIS — M75102 Unspecified rotator cuff tear or rupture of left shoulder, not specified as traumatic: Secondary | ICD-10-CM | POA: Diagnosis not present

## 2023-11-05 DIAGNOSIS — T8484XD Pain due to internal orthopedic prosthetic devices, implants and grafts, subsequent encounter: Secondary | ICD-10-CM | POA: Diagnosis not present

## 2023-11-05 DIAGNOSIS — M25512 Pain in left shoulder: Secondary | ICD-10-CM | POA: Diagnosis not present

## 2023-11-13 ENCOUNTER — Other Ambulatory Visit: Payer: Medicare PPO

## 2023-11-13 ENCOUNTER — Ambulatory Visit
Admission: RE | Admit: 2023-11-13 | Discharge: 2023-11-13 | Disposition: A | Discharge status: Home / Self Care | Payer: Medicare PPO | Source: Ambulatory Visit | Attending: Nurse Practitioner

## 2023-11-13 DIAGNOSIS — Z1231 Encounter for screening mammogram for malignant neoplasm of breast: Secondary | ICD-10-CM | POA: Diagnosis not present

## 2023-11-21 DIAGNOSIS — M25512 Pain in left shoulder: Secondary | ICD-10-CM | POA: Diagnosis not present

## 2023-12-05 DIAGNOSIS — M75102 Unspecified rotator cuff tear or rupture of left shoulder, not specified as traumatic: Secondary | ICD-10-CM | POA: Diagnosis not present

## 2023-12-05 DIAGNOSIS — M12812 Other specific arthropathies, not elsewhere classified, left shoulder: Secondary | ICD-10-CM | POA: Diagnosis not present

## 2023-12-05 DIAGNOSIS — M19012 Primary osteoarthritis, left shoulder: Secondary | ICD-10-CM | POA: Diagnosis not present

## 2023-12-10 ENCOUNTER — Other Ambulatory Visit (HOSPITAL_COMMUNITY): Payer: Self-pay | Admitting: Adult Health

## 2023-12-10 ENCOUNTER — Ambulatory Visit (HOSPITAL_COMMUNITY)
Admission: RE | Admit: 2023-12-10 | Discharge: 2023-12-10 | Disposition: A | Discharge status: Home / Self Care | Source: Ambulatory Visit | Attending: Adult Health | Admitting: Adult Health

## 2023-12-10 DIAGNOSIS — K8020 Calculus of gallbladder without cholecystitis without obstruction: Secondary | ICD-10-CM | POA: Diagnosis not present

## 2023-12-10 DIAGNOSIS — N200 Calculus of kidney: Secondary | ICD-10-CM | POA: Diagnosis not present

## 2023-12-10 DIAGNOSIS — R1900 Intra-abdominal and pelvic swelling, mass and lump, unspecified site: Secondary | ICD-10-CM | POA: Insufficient documentation

## 2023-12-10 DIAGNOSIS — K5909 Other constipation: Secondary | ICD-10-CM | POA: Diagnosis not present

## 2023-12-10 DIAGNOSIS — R935 Abnormal findings on diagnostic imaging of other abdominal regions, including retroperitoneum: Secondary | ICD-10-CM | POA: Insufficient documentation

## 2023-12-10 DIAGNOSIS — R1030 Lower abdominal pain, unspecified: Secondary | ICD-10-CM | POA: Diagnosis not present

## 2023-12-10 DIAGNOSIS — N281 Cyst of kidney, acquired: Secondary | ICD-10-CM | POA: Diagnosis not present

## 2023-12-10 DIAGNOSIS — I10 Essential (primary) hypertension: Secondary | ICD-10-CM | POA: Diagnosis not present

## 2023-12-10 DIAGNOSIS — R0989 Other specified symptoms and signs involving the circulatory and respiratory systems: Secondary | ICD-10-CM | POA: Diagnosis not present

## 2023-12-10 DIAGNOSIS — R197 Diarrhea, unspecified: Secondary | ICD-10-CM | POA: Diagnosis not present

## 2023-12-10 MED ORDER — IOHEXOL 300 MG/ML  SOLN
100.0000 mL | Freq: Once | INTRAMUSCULAR | Status: AC | PRN
  Administered 2023-12-10: 100 mL via INTRAVENOUS

## 2024-01-28 NOTE — Progress Notes (Signed)
 Natalie [name]                                          MRN: [id number]   01/28/2024   The [facility] Quality Team Specialist reviewed this patient medical record for the purposes of chart review for care gap closure. The following were reviewed: chart review for care gap closure-controlling blood pressure.    [facility]I Quality Team

## 2024-02-02 DIAGNOSIS — L820 Inflamed seborrheic keratosis: Secondary | ICD-10-CM | POA: Diagnosis not present

## 2024-02-02 DIAGNOSIS — L570 Actinic keratosis: Secondary | ICD-10-CM | POA: Diagnosis not present

## 2024-02-02 DIAGNOSIS — L821 Other seborrheic keratosis: Secondary | ICD-10-CM | POA: Diagnosis not present

## 2024-02-02 DIAGNOSIS — D044 Carcinoma in situ of skin of scalp and neck: Secondary | ICD-10-CM | POA: Diagnosis not present

## 2024-02-02 DIAGNOSIS — Z85828 Personal history of other malignant neoplasm of skin: Secondary | ICD-10-CM | POA: Diagnosis not present
